# Patient Record
Sex: Female | Born: 1980 | Race: White | Hispanic: No | Marital: Single | Smoking: Former smoker
Health system: Southern US, Community
[De-identification: ages and names within clinical notes are randomized; demographics above are authoritative.]

## PROBLEM LIST (undated history)

## (undated) ENCOUNTER — Inpatient Hospital Stay (HOSPITAL_COMMUNITY): Payer: Self-pay

## (undated) DIAGNOSIS — D696 Thrombocytopenia, unspecified: Secondary | ICD-10-CM

## (undated) DIAGNOSIS — K7460 Unspecified cirrhosis of liver: Secondary | ICD-10-CM

## (undated) DIAGNOSIS — Z5189 Encounter for other specified aftercare: Secondary | ICD-10-CM

## (undated) DIAGNOSIS — Z789 Other specified health status: Secondary | ICD-10-CM

## (undated) DIAGNOSIS — K3189 Other diseases of stomach and duodenum: Secondary | ICD-10-CM

## (undated) DIAGNOSIS — O09299 Supervision of pregnancy with other poor reproductive or obstetric history, unspecified trimester: Secondary | ICD-10-CM

## (undated) HISTORY — PX: CERVICAL CERCLAGE: SHX1329

## (undated) HISTORY — DX: Supervision of pregnancy with other poor reproductive or obstetric history, unspecified trimester: O09.299

---

## 1999-08-06 ENCOUNTER — Emergency Department (HOSPITAL_COMMUNITY): Admission: EM | Admit: 1999-08-06 | Discharge: 1999-08-06 | Payer: Self-pay | Admitting: Emergency Medicine

## 2000-03-08 ENCOUNTER — Encounter: Payer: Self-pay | Admitting: Emergency Medicine

## 2000-03-08 ENCOUNTER — Emergency Department (HOSPITAL_COMMUNITY): Admission: EM | Admit: 2000-03-08 | Discharge: 2000-03-08 | Payer: Self-pay | Admitting: Emergency Medicine

## 2001-08-16 ENCOUNTER — Inpatient Hospital Stay (HOSPITAL_COMMUNITY): Admission: AD | Admit: 2001-08-16 | Discharge: 2001-08-16 | Payer: Self-pay | Admitting: Obstetrics

## 2001-08-22 ENCOUNTER — Ambulatory Visit (HOSPITAL_COMMUNITY): Admission: RE | Admit: 2001-08-22 | Discharge: 2001-08-22 | Payer: Self-pay | Admitting: Obstetrics

## 2001-12-28 ENCOUNTER — Inpatient Hospital Stay (HOSPITAL_COMMUNITY): Admission: AD | Admit: 2001-12-28 | Discharge: 2001-12-28 | Payer: Self-pay | Admitting: Obstetrics

## 2002-01-30 ENCOUNTER — Inpatient Hospital Stay (HOSPITAL_COMMUNITY): Admission: AD | Admit: 2002-01-30 | Discharge: 2002-02-02 | Payer: Self-pay | Admitting: Obstetrics

## 2002-02-03 ENCOUNTER — Encounter: Admission: RE | Admit: 2002-02-03 | Discharge: 2002-03-05 | Payer: Self-pay | Admitting: Obstetrics

## 2002-03-06 ENCOUNTER — Encounter: Admission: RE | Admit: 2002-03-06 | Discharge: 2002-04-05 | Payer: Self-pay | Admitting: Obstetrics

## 2006-11-02 ENCOUNTER — Ambulatory Visit (HOSPITAL_COMMUNITY): Admission: RE | Admit: 2006-11-02 | Discharge: 2006-11-02 | Payer: Self-pay | Admitting: Obstetrics

## 2006-12-07 ENCOUNTER — Ambulatory Visit (HOSPITAL_COMMUNITY): Admission: RE | Admit: 2006-12-07 | Discharge: 2006-12-07 | Payer: Self-pay | Admitting: Obstetrics

## 2007-01-11 ENCOUNTER — Ambulatory Visit (HOSPITAL_COMMUNITY): Admission: RE | Admit: 2007-01-11 | Discharge: 2007-01-11 | Payer: Self-pay | Admitting: Obstetrics

## 2007-01-25 ENCOUNTER — Ambulatory Visit (HOSPITAL_COMMUNITY): Admission: RE | Admit: 2007-01-25 | Discharge: 2007-01-25 | Payer: Self-pay | Admitting: Obstetrics

## 2007-02-08 ENCOUNTER — Ambulatory Visit (HOSPITAL_COMMUNITY): Admission: RE | Admit: 2007-02-08 | Discharge: 2007-02-08 | Payer: Self-pay | Admitting: Obstetrics

## 2007-02-22 ENCOUNTER — Ambulatory Visit (HOSPITAL_COMMUNITY): Admission: RE | Admit: 2007-02-22 | Discharge: 2007-02-22 | Payer: Self-pay | Admitting: Obstetrics

## 2007-03-08 ENCOUNTER — Ambulatory Visit (HOSPITAL_COMMUNITY): Admission: RE | Admit: 2007-03-08 | Discharge: 2007-03-08 | Payer: Self-pay | Admitting: Obstetrics

## 2007-03-21 ENCOUNTER — Ambulatory Visit (HOSPITAL_COMMUNITY): Admission: RE | Admit: 2007-03-21 | Discharge: 2007-03-21 | Payer: Self-pay | Admitting: Obstetrics

## 2007-03-30 ENCOUNTER — Ambulatory Visit (HOSPITAL_COMMUNITY): Admission: RE | Admit: 2007-03-30 | Discharge: 2007-03-30 | Payer: Self-pay | Admitting: Obstetrics

## 2007-04-13 ENCOUNTER — Ambulatory Visit (HOSPITAL_COMMUNITY): Admission: RE | Admit: 2007-04-13 | Discharge: 2007-04-13 | Payer: Self-pay | Admitting: Obstetrics

## 2007-05-04 ENCOUNTER — Ambulatory Visit (HOSPITAL_COMMUNITY): Admission: RE | Admit: 2007-05-04 | Discharge: 2007-05-04 | Payer: Self-pay | Admitting: Obstetrics

## 2007-05-23 ENCOUNTER — Inpatient Hospital Stay (HOSPITAL_COMMUNITY): Admission: AD | Admit: 2007-05-23 | Discharge: 2007-05-23 | Payer: Self-pay | Admitting: Obstetrics

## 2007-05-30 ENCOUNTER — Inpatient Hospital Stay (HOSPITAL_COMMUNITY): Admission: AD | Admit: 2007-05-30 | Discharge: 2007-06-01 | Payer: Self-pay | Admitting: Obstetrics

## 2008-11-22 DIAGNOSIS — Z5189 Encounter for other specified aftercare: Secondary | ICD-10-CM

## 2008-11-22 HISTORY — DX: Encounter for other specified aftercare: Z51.89

## 2009-03-29 ENCOUNTER — Observation Stay: Payer: Self-pay | Admitting: Obstetrics and Gynecology

## 2010-01-09 ENCOUNTER — Ambulatory Visit (HOSPITAL_COMMUNITY): Admission: RE | Admit: 2010-01-09 | Discharge: 2010-01-09 | Payer: Self-pay | Admitting: Obstetrics

## 2010-01-09 ENCOUNTER — Observation Stay (HOSPITAL_COMMUNITY): Admission: AD | Admit: 2010-01-09 | Discharge: 2010-01-09 | Payer: Self-pay | Admitting: Obstetrics

## 2010-01-13 ENCOUNTER — Ambulatory Visit (HOSPITAL_COMMUNITY): Admission: RE | Admit: 2010-01-13 | Discharge: 2010-01-13 | Payer: Self-pay | Admitting: Obstetrics

## 2010-01-14 ENCOUNTER — Ambulatory Visit (HOSPITAL_COMMUNITY): Admission: RE | Admit: 2010-01-14 | Discharge: 2010-01-14 | Payer: Self-pay | Admitting: Obstetrics

## 2010-03-02 ENCOUNTER — Inpatient Hospital Stay (HOSPITAL_COMMUNITY): Admission: AD | Admit: 2010-03-02 | Discharge: 2010-03-05 | Payer: Self-pay | Admitting: Obstetrics

## 2011-02-10 LAB — CBC
Hemoglobin: 10.9 g/dL — ABNORMAL LOW (ref 12.0–15.0)
MCHC: 33.8 g/dL (ref 30.0–36.0)
MCHC: 35.1 g/dL (ref 30.0–36.0)
MCV: 91.8 fL (ref 78.0–100.0)
MCV: 92.8 fL (ref 78.0–100.0)
Platelets: 175 10*3/uL (ref 150–400)
RBC: 3.26 MIL/uL — ABNORMAL LOW (ref 3.87–5.11)
RDW: 13.9 % (ref 11.5–15.5)
RDW: 14.3 % (ref 11.5–15.5)
WBC: 10.5 10*3/uL (ref 4.0–10.5)
WBC: 9.8 10*3/uL (ref 4.0–10.5)

## 2011-02-10 LAB — RPR: RPR Ser Ql: NONREACTIVE

## 2011-04-09 NOTE — Op Note (Signed)
[facility]  Patient:    Lindsey Huang, Lindsey Huang Visit Number: [id number] MRN: [id number]          Service Type: DSU Location: [facility] Attending Physician:  Venita Sheffield Dictated by:   Kathreen Cosier, M.D. Proc. Date: 08/22/01 Admit Date:  08/22/2001                             Operative Report  PREOPERATIVE DIAGNOSES:       Incompetent cervix.  PROCEDURE:                    Cervical cerclage.  DESCRIPTION OF PROCEDURE:     After spinal anesthesia was administered with patient in the lithotomy position, perineum and vagina were prepped and draped.  Bladder emptied with straight catheter.  Weighted speculum placed in the vagina.  Using a #5 Mersilene band [employer] was placed high up in the cervix in the usual fashion and tied at 6 oclock.  Patient tolerated the procedure well.  Taken to the recovery room in good condition.Dictated by: Kathreen Cosier, M.D. Attending Physician:  Venita Sheffield DD:  08/22/01 TD:  08/22/01 Job: [id number]

## 2011-04-09 NOTE — Op Note (Signed)
[name], Lindsey Huang                ACCOUNT NO.:  [id number]   MEDICAL RECORD NO.:  [id number]          PATIENT TYPE:  AMB   LOCATION:  SDC                           FACILITY:  [facility]   PHYSICIAN:  Kathreen Cosier, M.D.DATE OF BIRTH:  [birth year]   DATE OF PROCEDURE:  12/07/2006  DATE OF DISCHARGE:                               OPERATIVE REPORT   PREOPERATIVE DIAGNOSIS:  Incompetent cervix.   PROCEDURE:  Cervical cerclage.   After __________ was administered, the patient was placed in lithotomy  position.  Perineum and vagina prepped and draped, bladder entered with  a straight catheter with a speculum placed in the vagina.  The cervix  was grasped with ring forceps at 12 o'clock and a #5 Mersilene then used  to place McDonald sutures in the usual manner.  This was tied at 6  o'clock.  The patient tolerated the procedure well, taken to the  recovery room in good condition.           ______________________________  Kathreen Cosier, M.D.     BAM/MEDQ  D:  12/07/2006  T:  12/07/2006  Job:  [id number]

## 2011-09-07 LAB — CBC
HCT: 29.6 — ABNORMAL LOW
HCT: 31.3 — ABNORMAL LOW
Hemoglobin: 10.1 — ABNORMAL LOW
Hemoglobin: 10.7 — ABNORMAL LOW
MCHC: 34.1
MCHC: 34.2
MCV: 88.4
MCV: 88.9
Platelets: 222
Platelets: 238
RBC: 3.33 — ABNORMAL LOW
RBC: 3.54 — ABNORMAL LOW
RDW: 14.2 — ABNORMAL HIGH
RDW: 14.3 — ABNORMAL HIGH
WBC: 13.1 — ABNORMAL HIGH
WBC: 9

## 2011-09-07 LAB — RPR: RPR Ser Ql: NONREACTIVE

## 2012-08-07 ENCOUNTER — Emergency Department: Payer: Self-pay | Admitting: Emergency Medicine

## 2012-08-07 LAB — CBC
HCT: 41.7 % (ref 35.0–47.0)
HGB: 14.5 g/dL (ref 12.0–16.0)
MCH: 31.8 pg (ref 26.0–34.0)
MCHC: 34.8 g/dL (ref 32.0–36.0)
Platelet: 239 10*3/uL (ref 150–440)
RDW: 12.1 % (ref 11.5–14.5)
WBC: 8.3 10*3/uL (ref 3.6–11.0)

## 2012-08-07 LAB — URINALYSIS, COMPLETE
Blood: NEGATIVE
Glucose,UR: NEGATIVE mg/dL (ref 0–75)
Leukocyte Esterase: NEGATIVE
Nitrite: NEGATIVE
Protein: NEGATIVE
Specific Gravity: 1.026 (ref 1.003–1.030)

## 2012-08-07 LAB — COMPREHENSIVE METABOLIC PANEL
Anion Gap: 12 (ref 7–16)
Bilirubin,Total: 0.8 mg/dL (ref 0.2–1.0)
Calcium, Total: 9 mg/dL (ref 8.5–10.1)
Chloride: 107 mmol/L (ref 98–107)
Co2: 18 mmol/L — ABNORMAL LOW (ref 21–32)
EGFR (African American): >60
EGFR (Non-African Amer.): >60
Potassium: 3.8 mmol/L (ref 3.5–5.1)
SGOT(AST): 42 U/L — ABNORMAL HIGH (ref 15–37)
SGPT (ALT): 38 U/L (ref 12–78)
Total Protein: 7.5 g/dL (ref 6.4–8.2)

## 2012-08-07 LAB — PREGNANCY, URINE: Pregnancy Test, Urine: NEGATIVE m[IU]/mL

## 2013-10-22 LAB — OB RESULTS CONSOLE ABO/RH: RH Type: POSITIVE

## 2013-10-22 LAB — OB RESULTS CONSOLE GC/CHLAMYDIA
CHLAMYDIA, DNA PROBE: NEGATIVE
Gonorrhea: NEGATIVE

## 2013-10-22 LAB — OB RESULTS CONSOLE HEPATITIS B SURFACE ANTIGEN: Hepatitis B Surface Ag: NEGATIVE

## 2013-10-22 LAB — OB RESULTS CONSOLE ANTIBODY SCREEN: Antibody Screen: NEGATIVE

## 2013-10-22 LAB — OB RESULTS CONSOLE RPR: RPR: NONREACTIVE

## 2013-10-22 LAB — OB RESULTS CONSOLE HIV ANTIBODY (ROUTINE TESTING): HIV: NONREACTIVE

## 2013-10-22 LAB — OB RESULTS CONSOLE RUBELLA ANTIBODY, IGM: RUBELLA: IMMUNE

## 2013-11-19 LAB — OB RESULTS CONSOLE GBS: STREP GROUP B AG: NEGATIVE

## 2013-11-19 LAB — OB RESULTS CONSOLE GC/CHLAMYDIA
Chlamydia: NEGATIVE
Gonorrhea: NEGATIVE

## 2013-11-22 NOTE — L&D Delivery Note (Signed)
Delivery Note At 12:48 PM a viable female was delivered via  (Presentation: ;  ).  APGAR: , ; weight .   Placenta status: , .  Cord:  with the following complications: .  Cord pH: not done  Anesthesia:   Episiotomy:  Lacerations:  Suture Repair: 2.0 vicryl Est. Blood Loss (mL):   Mom to postpartum.  Baby to Couplet care / Skin to Skin.  Diesel Lina A 12/25/2013, 12:55 PM

## 2013-12-12 ENCOUNTER — Inpatient Hospital Stay (HOSPITAL_COMMUNITY)
Admission: AD | Admit: 2013-12-12 | Discharge: 2013-12-12 | Disposition: A | Discharge status: Home / Self Care | Payer: Medicaid Other | Source: Ambulatory Visit | Attending: Obstetrics | Admitting: Obstetrics

## 2013-12-12 ENCOUNTER — Encounter (HOSPITAL_COMMUNITY): Payer: Self-pay | Admitting: *Deleted

## 2013-12-12 DIAGNOSIS — O368190 Decreased fetal movements, unspecified trimester, not applicable or unspecified: Secondary | ICD-10-CM | POA: Insufficient documentation

## 2013-12-12 DIAGNOSIS — O479 False labor, unspecified: Secondary | ICD-10-CM | POA: Insufficient documentation

## 2013-12-12 HISTORY — DX: Other specified health status: Z78.9

## 2013-12-12 NOTE — Treatment Plan (Signed)
Dr Gaynell Facemarshall notified of patients status, gestational age, complaints, cervical exam, FHR, patient may d/c home

## 2013-12-12 NOTE — MAU Note (Signed)
Contractions, decreased fetal movement

## 2013-12-18 ENCOUNTER — Encounter (HOSPITAL_COMMUNITY): Payer: Self-pay | Admitting: *Deleted

## 2013-12-18 ENCOUNTER — Telehealth (HOSPITAL_COMMUNITY): Payer: Self-pay | Admitting: *Deleted

## 2013-12-18 NOTE — Telephone Encounter (Signed)
Preadmission screen  

## 2013-12-25 ENCOUNTER — Inpatient Hospital Stay (HOSPITAL_COMMUNITY): Payer: Medicaid Other | Admitting: Anesthesiology

## 2013-12-25 ENCOUNTER — Encounter (HOSPITAL_COMMUNITY): Payer: Medicaid Other | Admitting: Anesthesiology

## 2013-12-25 ENCOUNTER — Encounter (HOSPITAL_COMMUNITY): Payer: Self-pay

## 2013-12-25 ENCOUNTER — Inpatient Hospital Stay (HOSPITAL_COMMUNITY)
Admission: RE | Admit: 2013-12-25 | Discharge: 2013-12-26 | DRG: 775 | Disposition: A | Discharge status: Home / Self Care | Payer: Medicaid Other | Source: Ambulatory Visit | Attending: Obstetrics | Admitting: Obstetrics

## 2013-12-25 DIAGNOSIS — O480 Post-term pregnancy: Principal | ICD-10-CM | POA: Diagnosis present

## 2013-12-25 DIAGNOSIS — Z87891 Personal history of nicotine dependence: Secondary | ICD-10-CM

## 2013-12-25 HISTORY — DX: Encounter for other specified aftercare: Z51.89

## 2013-12-25 LAB — CBC
HCT: 32.5 % — ABNORMAL LOW (ref 36.0–46.0)
HEMOGLOBIN: 10.7 g/dL — AB (ref 12.0–15.0)
MCH: 29.9 pg (ref 26.0–34.0)
MCHC: 32.9 g/dL (ref 30.0–36.0)
MCV: 90.8 fL (ref 78.0–100.0)
Platelets: 201 10*3/uL (ref 150–400)
RBC: 3.58 MIL/uL — AB (ref 3.87–5.11)
RDW: 14.3 % (ref 11.5–15.5)
WBC: 10.2 10*3/uL (ref 4.0–10.5)

## 2013-12-25 LAB — TYPE AND SCREEN
ABO/RH(D): A POS
Antibody Screen: NEGATIVE

## 2013-12-25 LAB — ABO/RH: ABO/RH(D): A POS

## 2013-12-25 LAB — RPR: RPR: NONREACTIVE

## 2013-12-25 MED ORDER — BENZOCAINE-MENTHOL 20-0.5 % EX AERO
1.0000 "application " | INHALATION_SPRAY | CUTANEOUS | Status: DC | PRN
  Administered 2013-12-25: 1 via TOPICAL
  Filled 2013-12-25: qty 56

## 2013-12-25 MED ORDER — CITRIC ACID-SODIUM CITRATE 334-500 MG/5ML PO SOLN: 30.0000 mL | ORAL | Status: DC | PRN

## 2013-12-25 MED ORDER — WITCH HAZEL-GLYCERIN EX PADS: 1.0000 "application " | MEDICATED_PAD | CUTANEOUS | Status: DC | PRN

## 2013-12-25 MED ORDER — TETANUS-DIPHTH-ACELL PERTUSSIS 5-2.5-18.5 LF-MCG/0.5 IM SUSP: 0.5000 mL | Freq: Once | INTRAMUSCULAR | Status: DC

## 2013-12-25 MED ORDER — ONDANSETRON HCL 4 MG PO TABS: 4.0000 mg | ORAL_TABLET | ORAL | Status: DC | PRN

## 2013-12-25 MED ORDER — ACETAMINOPHEN 325 MG PO TABS: 650.0000 mg | ORAL_TABLET | ORAL | Status: DC | PRN

## 2013-12-25 MED ORDER — EPHEDRINE 5 MG/ML INJ
10.0000 mg | INTRAVENOUS | Status: DC | PRN
  Filled 2013-12-25: qty 2

## 2013-12-25 MED ORDER — IBUPROFEN 600 MG PO TABS
600.0000 mg | ORAL_TABLET | Freq: Four times a day (QID) | ORAL | Status: DC
  Administered 2013-12-25 – 2013-12-26 (×4): 600 mg via ORAL
  Filled 2013-12-25 (×4): qty 1

## 2013-12-25 MED ORDER — OXYCODONE-ACETAMINOPHEN 5-325 MG PO TABS
1.0000 | ORAL_TABLET | ORAL | Status: DC | PRN
  Administered 2013-12-25 – 2013-12-26 (×3): 1 via ORAL
  Administered 2013-12-26: 2 via ORAL
  Filled 2013-12-25: qty 1
  Filled 2013-12-25: qty 2
  Filled 2013-12-25 (×2): qty 1

## 2013-12-25 MED ORDER — LACTATED RINGERS IV SOLN: 500.0000 mL | Freq: Once | INTRAVENOUS | Status: DC

## 2013-12-25 MED ORDER — OXYTOCIN 40 UNITS IN LACTATED RINGERS INFUSION - SIMPLE MED
1.0000 m[IU]/min | INTRAVENOUS | Status: DC
  Administered 2013-12-25: 2 m[IU]/min via INTRAVENOUS
  Filled 2013-12-25: qty 1000

## 2013-12-25 MED ORDER — LACTATED RINGERS IV SOLN
INTRAVENOUS | Status: DC
  Administered 2013-12-25: 08:00:00 via INTRAVENOUS

## 2013-12-25 MED ORDER — ZOLPIDEM TARTRATE 5 MG PO TABS: 5.0000 mg | ORAL_TABLET | Freq: Every evening | ORAL | Status: DC | PRN

## 2013-12-25 MED ORDER — DIBUCAINE 1 % RE OINT: 1.0000 "application " | TOPICAL_OINTMENT | RECTAL | Status: DC | PRN

## 2013-12-25 MED ORDER — ONDANSETRON HCL 4 MG/2ML IJ SOLN
4.0000 mg | Freq: Four times a day (QID) | INTRAMUSCULAR | Status: DC | PRN
Start: 2013-12-25 — End: 2013-12-25

## 2013-12-25 MED ORDER — OXYTOCIN BOLUS FROM INFUSION: 500.0000 mL | INTRAVENOUS | Status: DC

## 2013-12-25 MED ORDER — FLEET ENEMA 7-19 GM/118ML RE ENEM: 1.0000 | ENEMA | Freq: Once | RECTAL | Status: DC

## 2013-12-25 MED ORDER — METHYLERGONOVINE MALEATE 0.2 MG PO TABS
0.2000 mg | ORAL_TABLET | ORAL | Status: DC
  Administered 2013-12-25 – 2013-12-26 (×6): 0.2 mg via ORAL
  Filled 2013-12-25 (×6): qty 1

## 2013-12-25 MED ORDER — TERBUTALINE SULFATE 1 MG/ML IJ SOLN: 0.2500 mg | Freq: Once | INTRAMUSCULAR | Status: DC | PRN

## 2013-12-25 MED ORDER — LANOLIN HYDROUS EX OINT: TOPICAL_OINTMENT | CUTANEOUS | Status: DC | PRN

## 2013-12-25 MED ORDER — FERROUS SULFATE 325 (65 FE) MG PO TABS
325.0000 mg | ORAL_TABLET | Freq: Two times a day (BID) | ORAL | Status: DC
  Administered 2013-12-25 – 2013-12-26 (×2): 325 mg via ORAL
  Filled 2013-12-25 (×2): qty 1

## 2013-12-25 MED ORDER — DIPHENHYDRAMINE HCL 50 MG/ML IJ SOLN: 12.5000 mg | INTRAMUSCULAR | Status: DC | PRN

## 2013-12-25 MED ORDER — IBUPROFEN 600 MG PO TABS: 600.0000 mg | ORAL_TABLET | Freq: Four times a day (QID) | ORAL | Status: DC | PRN

## 2013-12-25 MED ORDER — OXYTOCIN 40 UNITS IN LACTATED RINGERS INFUSION - SIMPLE MED: 62.5000 mL/h | INTRAVENOUS | Status: DC

## 2013-12-25 MED ORDER — BUTORPHANOL TARTRATE 1 MG/ML IJ SOLN
1.0000 mg | INTRAMUSCULAR | Status: DC | PRN
  Administered 2013-12-25: 1 mg via INTRAVENOUS
  Filled 2013-12-25: qty 1

## 2013-12-25 MED ORDER — LACTATED RINGERS IV SOLN: 500.0000 mL | INTRAVENOUS | Status: DC | PRN

## 2013-12-25 MED ORDER — EPHEDRINE 5 MG/ML INJ
10.0000 mg | INTRAVENOUS | Status: DC | PRN
  Filled 2013-12-25: qty 2
  Filled 2013-12-25: qty 4

## 2013-12-25 MED ORDER — SENNOSIDES-DOCUSATE SODIUM 8.6-50 MG PO TABS
2.0000 | ORAL_TABLET | ORAL | Status: DC
Start: 2013-12-26 — End: 2013-12-26
  Administered 2013-12-25: 2 via ORAL
  Filled 2013-12-25: qty 2

## 2013-12-25 MED ORDER — ONDANSETRON HCL 4 MG/2ML IJ SOLN: 4.0000 mg | INTRAMUSCULAR | Status: DC | PRN

## 2013-12-25 MED ORDER — SIMETHICONE 80 MG PO CHEW: 80.0000 mg | CHEWABLE_TABLET | ORAL | Status: DC | PRN

## 2013-12-25 MED ORDER — PHENYLEPHRINE 40 MCG/ML (10ML) SYRINGE FOR IV PUSH (FOR BLOOD PRESSURE SUPPORT)
80.0000 ug | PREFILLED_SYRINGE | INTRAVENOUS | Status: DC | PRN
  Filled 2013-12-25: qty 2

## 2013-12-25 MED ORDER — PRENATAL MULTIVITAMIN CH
1.0000 | ORAL_TABLET | Freq: Every day | ORAL | Status: DC
  Administered 2013-12-26: 1 via ORAL
  Filled 2013-12-25: qty 1

## 2013-12-25 MED ORDER — DIPHENHYDRAMINE HCL 25 MG PO CAPS: 25.0000 mg | ORAL_CAPSULE | Freq: Four times a day (QID) | ORAL | Status: DC | PRN

## 2013-12-25 MED ORDER — LIDOCAINE HCL (PF) 1 % IJ SOLN
30.0000 mL | INTRAMUSCULAR | Status: DC | PRN
  Filled 2013-12-25 (×2): qty 30

## 2013-12-25 MED ORDER — FENTANYL 2.5 MCG/ML BUPIVACAINE 1/10 % EPIDURAL INFUSION (WH - ANES)
14.0000 mL/h | INTRAMUSCULAR | Status: DC | PRN
  Administered 2013-12-25: 14 mL/h via EPIDURAL
  Filled 2013-12-25: qty 125

## 2013-12-25 MED ORDER — PHENYLEPHRINE 40 MCG/ML (10ML) SYRINGE FOR IV PUSH (FOR BLOOD PRESSURE SUPPORT)
80.0000 ug | PREFILLED_SYRINGE | INTRAVENOUS | Status: DC | PRN
  Filled 2013-12-25: qty 2
  Filled 2013-12-25: qty 10

## 2013-12-25 MED ORDER — OXYCODONE-ACETAMINOPHEN 5-325 MG PO TABS: 1.0000 | ORAL_TABLET | ORAL | Status: DC | PRN

## 2013-12-25 MED ORDER — METHYLERGONOVINE MALEATE 0.2 MG/ML IJ SOLN
0.2000 mg | Freq: Once | INTRAMUSCULAR | Status: AC
  Administered 2013-12-25: 0.2 mg via INTRAMUSCULAR

## 2013-12-25 MED ORDER — LIDOCAINE HCL (PF) 1 % IJ SOLN
INTRAMUSCULAR | Status: DC | PRN
  Administered 2013-12-25: 5 mL

## 2013-12-25 NOTE — H&P (Signed)
This is and Dr. Francoise CeoBernard Marshall dictating the history and physical on  Lindsey EndoKelly  Huang she is a 33 year old gravida 7 para 3124 at 40 weeks and and a due date 12/24/2013 negative GBS admitted for induction cervix 3-4 cm 90% vertex -2 amniotomy performed the fluids clear and she is on low-dose Pitocin Past medical history negative Past surgical history negative Social history negative System review noncontributory Physical exam well-developed female in early labor HEENT negative Lungs clear to P&A Heart regular rhythm no murmurs no gallops Abdomen term Pelvic as described above Extremities negative

## 2013-12-25 NOTE — Anesthesia Preprocedure Evaluation (Signed)
Anesthesia Evaluation  Patient identified by MRN, date of birth, ID band Patient awake    Reviewed: Allergy & Precautions, H&P , Patient's Chart, lab work & pertinent test results  Airway Mallampati: II TM Distance: >3 FB Neck ROM: full    Dental   Pulmonary former smoker,  breath sounds clear to auscultation        Cardiovascular Rhythm:regular Rate:Normal     Neuro/Psych    GI/Hepatic   Endo/Other    Renal/GU      Musculoskeletal   Abdominal   Peds  Hematology   Anesthesia Other Findings   Reproductive/Obstetrics (+) Pregnancy                           Anesthesia Physical Anesthesia Plan  ASA: II  Anesthesia Plan: Epidural   Post-op Pain Management:    Induction:   Airway Management Planned:   Additional Equipment:   Intra-op Plan:   Post-operative Plan:   Informed Consent: I have reviewed the patients History and Physical, chart, labs and discussed the procedure including the risks, benefits and alternatives for the proposed anesthesia with the patient or authorized representative who has indicated his/her understanding and acceptance.     Plan Discussed with:   Anesthesia Plan Comments:         Anesthesia Quick Evaluation  

## 2013-12-25 NOTE — Anesthesia Procedure Notes (Signed)
Epidural Patient location during procedure: OB Start time: 12/25/2013 12:17 PM  Staffing Anesthesiologist: Brayton CavesJACKSON, Haruto Demaria Performed by: anesthesiologist   Preanesthetic Checklist Completed: patient identified, site marked, surgical consent, pre-op evaluation, timeout performed, IV checked, risks and benefits discussed and monitors and equipment checked  Epidural Patient position: sitting Prep: site prepped and draped and DuraPrep Patient monitoring: continuous pulse ox and blood pressure Approach: midline Injection technique: LOR air  Needle:  Needle type: Tuohy  Needle gauge: 17 G Needle length: 9 cm and 9 Needle insertion depth: 5 cm cm Catheter type: closed end flexible Catheter size: 19 Gauge Catheter at skin depth: 10 cm Test dose: negative  Assessment Events: blood not aspirated, injection not painful, no injection resistance, negative IV test and no paresthesia  Additional Notes Patient identified.  Risk benefits discussed including failed block, incomplete pain control, headache, nerve damage, paralysis, blood pressure changes, nausea, vomiting, reactions to medication both toxic or allergic, and postpartum back pain.  Patient expressed understanding and wished to proceed.  All questions were answered.  Sterile technique used throughout procedure and epidural site dressed with sterile barrier dressing. No paresthesia or other complications noted.The patient did not experience any signs of intravascular injection such as tinnitus or metallic taste in mouth nor signs of intrathecal spread such as rapid motor block. Please see nursing notes for vital signs.

## 2013-12-25 NOTE — Lactation Note (Signed)
This note was copied from the chart of Boy Fredderick SeveranceKelly Whitter. Lactation Consultation Note  Patient Name: Boy Fredderick SeveranceKelly Bolz ZOXWR'UToday's Date: 12/25/2013 Reason for consult: Initial assessment;Other (Comment) (charting for exclusion) This is mom's fourth child.  She has hx of delayed but abundant milk production with other babies and prefers combining breast and formula feeding, as well as pumping as needed.  Mom planning to call her insurance company to inquire about receiving an electric breast pump.  For now, she will offer breast and supplement with formula as needed.  Mom says she knows how to hand express her colostrum.  LC encouraged STS and cue feedings. LC encouraged review of Baby and Me pp 9, 14 and 20-25 for STS and BF information. LC provided [employer] Resource brochure and reviewed [facility] services and list of community and web site resources.    Maternal Data Formula Feeding for Exclusion: Yes Reason for exclusion: Mother's choice to formula and breast feed on admission Infant to breast within first hour of birth: Yes (sleepy at first attempt) Has patient been taught Hand Expression?: Yes (experienced with breastfeeding and pumping) Does the patient have breastfeeding experience prior to this delivery?: Yes  Feeding    LATCH Score/Interventions              initial LATCH score=6 (baby sleepy)        Lactation Tools Discussed/Used   STS, cue feedings, milk storage guidelines  Consult Status Consult Status: Follow-up Date: 12/26/13 Follow-up type: In-patient    Warrick ParisianBryant, Melvinia Ashby [facility] 12/25/2013, 8:31 PM

## 2013-12-26 LAB — CBC
HEMATOCRIT: 30.3 % — AB (ref 36.0–46.0)
Hemoglobin: 10.1 g/dL — ABNORMAL LOW (ref 12.0–15.0)
MCH: 30.3 pg (ref 26.0–34.0)
MCHC: 33.3 g/dL (ref 30.0–36.0)
MCV: 91 fL (ref 78.0–100.0)
PLATELETS: 199 10*3/uL (ref 150–400)
RBC: 3.33 MIL/uL — ABNORMAL LOW (ref 3.87–5.11)
RDW: 14.4 % (ref 11.5–15.5)
WBC: 11.1 10*3/uL — ABNORMAL HIGH (ref 4.0–10.5)

## 2013-12-26 NOTE — Progress Notes (Signed)
Admission nutrition screen triggered. Patients chart reviewed and assessed  for nutritional risk. Patient is determined to be at low nutrition  risk.   Alysabeth Scalia M.Ed. R.D. LDN Neonatal Nutrition Support Specialist Pager [phone]  

## 2013-12-26 NOTE — Discharge Instructions (Signed)
Discharge instructions ° °· You can wash your hair °· Shower °· Eat what you want °· Drink what you want °· See me in 6 weeks °· Your ankles are going to swell more in the next 2 weeks than when pregnant °· No sex for 6 weeks ° ° °Aminta Sakurai A, MD 12/26/2013 ° ° °

## 2013-12-26 NOTE — Progress Notes (Signed)
UR chart review completed.  

## 2013-12-26 NOTE — Anesthesia Postprocedure Evaluation (Signed)
  Anesthesia Post-op Note  Patient: Lindsey HalstedKelly D Bachtel  Procedure(s) Performed: * No procedures listed *  Patient Location: Mother/Baby  Anesthesia Type:Epidural  Level of Consciousness: awake  Airway and Oxygen Therapy: Patient Spontanous Breathing  Post-op Pain: none  Post-op Assessment: Patient's Cardiovascular Status Stable, Respiratory Function Stable, Patent Airway, No signs of Nausea or vomiting, Adequate PO intake, Pain level controlled, No headache, No backache, No residual numbness and No residual motor weakness  Post-op Vital Signs: Reviewed and stable  Complications: No apparent anesthesia complications

## 2013-12-26 NOTE — Clinical Social Work Maternal (Signed)
    Clinical Social Work Department PSYCHOSOCIAL ASSESSMENT - MATERNAL/CHILD 12/26/2013  Patient:  Lindsey Huang,Lindsey Huang  Account Number:  [id number]  Admit Date:  12/25/2013  Lindsey Huang:   Lindsey Huang    Clinical Social Worker:  Lindsey PutnamEDRA Jaylean Buenaventura, LCSW   Date/Time:  12/26/2013 02:53 PM  Date Referred:  12/26/2013   Referral source  CN     Referred reason  [facility]   Other referral source:    I:  FAMILY / HOME ENVIRONMENT Child's legal guardian:  PARENT  Guardian - Huang Guardian - Age Guardian - Address  Lindsey SeveranceKelly Huang 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  Pt told CSW that she did not know or suspect pregnancy.  She denies experiencing any experiencing any pregnancy symptoms until she could feel movement.  Once pregnancy was suspected & confirmed she established regular PNC.  She denies any illegal substance use & verbalized an understanding of hospital drug testing policy.  UDS is negative, meconium results are pending.  FOB is not involved.  According to pt, FOB ended  their 15 year relationship, last month.  CSW inquired about depression however pt denies symptoms.  She appears to be bonding well with the infant.  She identifies her sister as her primary support person.  Pt has all the necessary supplies for the infant.  CSW discussed PP depression signs/symptoms & encouraged her seek medical attention if needed.  CSW will continue to monitor drug screen results & make a referral if necessary.      VI SOCIAL WORK PLAN Social Work Plan  No Further Intervention Required / No Barriers to Discharge   Type of pt/family education:   If child protective services report - county:   If child protective services report - date:   Information/referral to community resources comment:   Other social work plan:

## 2013-12-26 NOTE — Progress Notes (Signed)
Patient ID: Lindsey Huang, female   DOB: Jan 22, 1981, 33 y.o.   MRN: [id number] Postpartum day one Vital signs normal Fundus firm Lochia moderate Legs negative Patient wants her to discharge on Percocet

## 2013-12-26 NOTE — Discharge Summary (Signed)
Obstetric Discharge Summary Reason for Admission: induction of labor Prenatal Procedures: none Intrapartum Procedures: spontaneous vaginal delivery Postpartum Procedures: none Complications-Operative and Postpartum: none Hemoglobin  Date Value Range Status  12/26/2013 10.1* 12.0 - 15.0 g/dL Final     HCT  Date Value Range Status  12/26/2013 30.3* 36.0 - 46.0 % Final    Physical Exam:  General: alert Lochia: appropriate Uterine Fundus: firm Incision: healing well DVT Evaluation: No evidence of DVT seen on physical exam.  Discharge Diagnoses: Term Pregnancy-delivered  Discharge Information: Date: 12/26/2013 Activity: pelvic rest Diet: routine Medications: Percocet Condition: stable Instructions: refer to practice specific booklet Discharge to: home Follow-up Information   Follow up with Kathreen CosierMARSHALL,BERNARD A, MD.   Specialty:  Obstetrics and Gynecology   Contact information:   [address] [place] [address] [phone]       Newborn Data: Live born female  Birth Weight: 6 lb 1.6 oz (2767 g) APGAR: 9, 9  Home with mother.  MARSHALL,BERNARD A 12/26/2013, 6:51 AM

## 2014-09-23 ENCOUNTER — Encounter (HOSPITAL_COMMUNITY): Payer: Self-pay

## 2015-01-31 ENCOUNTER — Observation Stay: Payer: Self-pay | Admitting: Internal Medicine

## 2015-03-23 NOTE — H&P (Signed)
PATIENT NAME:  Lindsey Huang, RAUTH MR#:  [id number] DATE OF BIRTH:  February 21, 1981  DATE OF ADMISSION:  01/31/2015  REFERRING PHYSICIAN: Enedina Finner. Manson Passey, MD   PRIMARY CARE PHYSICIAN: [facility].   ADMISSION DIAGNOSIS: Hepatitis.   HISTORY OF PRESENT ILLNESS: This is a 34 year old Caucasian female who presents to the Emergency Department complaining of her "eyes turning yellow." The patient admits to nausea and vomiting nearly daily for the last 30 days. She also admits to some sore throat secondary to the constant vomiting. She notes that her abdomen has been somewhat tender, particularly in the right upper quadrant, but she denies fevers. She admits to some fatigue. The patient also admits to liquor daily. Lately it has been between 2 and 4 shots per day; however, a few months ago the patient admits to at least one-fifth of liquor per day. She has tried to wean herself from that quantity of alcohol and admits that she felt shaky at some point but has reduced her alcohol intake to a few shots, as described above mostly due to her inability to keep food down. The patient admits to being under a lot of stress lately. Due to extreme elevations of her liver enzymes, the Emergency Department called for admission.   REVIEW OF SYSTEMS:  CONSTITUTIONAL: The patient denies fever, but admits to fatigue.  EYES: Denies blurred vision or inflammation.  EARS, NOSE AND THROAT: Denies tinnitus, but admits to sore throat.  RESPIRATORY: Denies cough or shortness of breath.  CARDIOVASCULAR: Denies chest pain, palpitations, orthopnea, or paroxysmal nocturnal dyspnea.  GASTROINTESTINAL: Admits to nausea and vomiting but denies diarrhea. The patient admits to some vague abdominal pain as well.   GENITOURINARY: Denies dysuria, increased frequency or hesitancy of urination.  ENDOCRINE: Denies polyuria or polydipsia.  HEMATOLOGIC AND LYMPHATIC: The patient admits to easy bruising, but denies any bleeding.   INTEGUMENTARY: Denies rashes or lesions.  MUSCULOSKELETAL: Denies arthralgias or myalgias.  NEUROLOGIC: Denies numbness or tingling in her extremities or dysarthria.  PSYCHIATRIC: Admits to depression, but denies suicidal ideation.   PAST MEDICAL HISTORY: The patient does not have any chronic medical illnesses nor has she been told she should be taking medications for anything in particular.   PAST SURGICAL HISTORY: The patient has had a cerclage placement for her pregnancies.   SOCIAL HISTORY: She has a 5-pack-year history of smoking. The patient denies any drugs. Her alcohol intake is described as above. She has 5 children, all of whom are in good health.   FAMILY HISTORY: The patient is unaware of any chronic medical illnesses including cancer, diabetes, or hypertension that run throughout her family.   MEDICATIONS: None.   ALLERGIES: No known drug allergies.   PERTINENT LABORATORY RESULTS AND RADIOGRAPHIC FINDINGS: Serum glucose is 112, BUN 7, creatinine 0.53, serum sodium 136, potassium 3.4, chloride is 100, bicarbonate 25, calcium is 9.1. Lipase is 31, protein is 6.6, serum albumin 3.6, total bilirubin 7.4, alkaline phosphatase 346, AST 736, ALT is 214. White blood cell count 6.5, hemoglobin is 12.1, hematocrit 37.7, platelet count 197,000, MCV is 100. INR is 0.9. Urinalysis is negative for infection. Ultrasound of the abdomen shows diffuse fatty infiltration of the liver with normal gallbladder and bile ducts.   PHYSICAL EXAMINATION:  VITAL SIGNS: Temperature is a 98.3, pulse 74, respirations 16, blood pressure 116/82, pulse oximetry is 100% on room air.  GENERAL: The patient is alert and oriented x 3 in no apparent distress.  HEENT: Normocephalic, atraumatic. Pupils equal, round, and  reactive to light and accommodation. There is scleral icterus present. Extraocular movements are intact. The patient has moist mucous membranes.  NECK: Trachea is midline. No adenopathy. Thyroid  nonpalpable and nontender.  CHEST: Symmetric and atraumatic.  CARDIOVASCULAR: Regular rate and rhythm. Normal S1, S2. No rubs, clicks, or murmurs appreciated.  LUNGS: Clear to auscultation bilaterally. Normal effort and excursion.  ABDOMEN: Positive bowel sounds. Soft. Mildly tender, particularly in the right upper quadrant. There is no splenomegaly. The liver edge is palpable approximately 2 cm below the costal margin. There is no rebound tenderness.  GENITOURINARY: Deferred.  MUSCULOSKELETAL: The patient has full range of motion of all 4 extremities. She has 5/5 strength in upper and lower extremities bilaterally.  SKIN: Warm and dry. No rashes or lesions.  EXTREMITIES: No clubbing, cyanosis, or edema.  NEUROLOGIC: Cranial nerves II-XII are grossly intact.  PSYCHIATRIC: Mood is normal. Affect is congruent. The patient has excellent insight and judgment into her medical condition.   ASSESSMENT AND PLAN: This is a 34 year old female admitted for hepatitis.  1.  Hepatitis. This is likely alcohol induced. She does have associated jaundice, but ultrasound of the liver does not show ductal dilatation or gallstones. She does have fatty liver. We should rule out viral etiology of hepatitis. Her synthetic function is fine, as her INR is normal at this time. She also has normal albumin. We will order a gastrointestinal consult at the discretion of the primary care team.  2.  Alcohol abuse. Place the patient on CIWA scale.  3.  Overweight. The patient's BMI is 26. I have encouraged exercise and healthy diet.  4.  Deep vein thrombosis prophylaxis. Sequential compression devices.  5.  Gastrointestinal prophylaxis. None, as the patient is not critically ill.   CODE STATUS: The patient is a full code.   TIME SPENT ADMISSION ORDERS AND PATIENT CARE: Approximately 35 minutes.   ____________________________ Kelton PillarMichael S. Sheryle Hailiamond, MD msd:bm D: 01/31/2015 03:11:27 ET T: 01/31/2015 03:34:02  ET JOB#: [id number]  cc: Kelton PillarMichael S. Sheryle Hailiamond, MD, <Dictator> Kelton PillarMICHAEL S Glade Strausser MD ELECTRONICALLY SIGNED 02/03/2015 3:16

## 2015-03-23 NOTE — Discharge Summary (Signed)
PATIENT NAME:  Lindsey Huang, Lindsey Huang MR#:  [id number] DATE OF BIRTH:  [birth year]  DATE OF ADMISSION:  01/31/2015 DATE OF DISCHARGE:  02/01/2015  PRIMARY CARE PHYSICIAN:  Excell SeltzerJohn J. Annabell HowellsWrenn, MD  DISCHARGE DIAGNOSES: 1.  Alcohol-induced hepatitis.  2.  Alcohol abuse.   CONDITION: Stable.   CODE STATUS: Full code.   HOME MEDICATIONS: Folic acid 1 mg p.o. daily, thiamine 100 mg p.o. once a day.   DIET: Regular diet.   ACTIVITY: As tolerated.  FOLLOW-UP CARE: Follow with PCP within 1-2 weeks. Also follow up CMP with PCP.   REASON FOR ADMISSION:   Yellow eyes.   HOSPITAL COURSE: The patient is a 34 year old Caucasian female with no past medical history.   Came to ED due to yellow eyes and constant vomiting.  The patient has been drinking alcohol and  recently reduced alcohol intake. For detailed history and physical examination, please refer to the admission note dictated by Dr. Sheryle Hailiamond.  On admission date, the patient's laboratory data showed BUN 7, creatinine 0.53, sodium 136, potassium 3.4, chloride 100, bicarbonate 25, lipase 31, bilirubin 7.4, alkaline phosphatase 346, AST 736, ALT 214. WBC 6.5, hemoglobin 12.1, platelets 197,000. INR 0.1. Urinalysis was negative. Ultrasound of abdomen showed diffuse fatty infiltrate of  liver with normal gallbladder and bile duct. The patient was admitted for alcohol-induced hepatitis.  After admission, the patient has been placed on IV fluid support.  Hepatitis panel is negative. The patient's liver function tests are better.  Jaundice is better.    For alcohol abuse, the patient is placed on CIWA protocol. The patient denies any tremor or shaking.   For tobacco abuse, the patient was counseled for smoking cessation.   The patient has no complaints; she wants to go home. Her vital signs are stable. I advised the patient to follow up a CMP with primary care physician.  Discussed the patient's discharge plan with the patient and the nurse.   TIME SPENT: About 35  minutes.    ____________________________ Shaune PollackQing Fathima Bartl, MD qc:tr D: 02/01/2015 16:08:13 ET T: 02/01/2015 17:31:03 ET JOB#: [id number]  cc: Shaune PollackQing Jalana Moore, MD, <Dictator> Shaune PollackQING Jalin Alicea MD ELECTRONICALLY SIGNED 02/02/2015 15:09

## 2015-10-01 ENCOUNTER — Encounter: Payer: Self-pay | Admitting: Emergency Medicine

## 2015-10-01 ENCOUNTER — Emergency Department
Admission: EM | Admit: 2015-10-01 | Discharge: 2015-10-01 | Disposition: A | Discharge status: Home / Self Care | Payer: Medicaid Other | Attending: Emergency Medicine | Admitting: Emergency Medicine

## 2015-10-01 DIAGNOSIS — R112 Nausea with vomiting, unspecified: Secondary | ICD-10-CM | POA: Diagnosis not present

## 2015-10-01 DIAGNOSIS — Z87891 Personal history of nicotine dependence: Secondary | ICD-10-CM | POA: Insufficient documentation

## 2015-10-01 DIAGNOSIS — Z3202 Encounter for pregnancy test, result negative: Secondary | ICD-10-CM | POA: Insufficient documentation

## 2015-10-01 LAB — COMPREHENSIVE METABOLIC PANEL
ALK PHOS: 60 U/L (ref 38–126)
ALT: 57 U/L — AB (ref 14–54)
AST: 91 U/L — ABNORMAL HIGH (ref 15–41)
Albumin: 4.7 g/dL (ref 3.5–5.0)
Anion gap: 14 (ref 5–15)
BUN: 21 mg/dL — ABNORMAL HIGH (ref 6–20)
CALCIUM: 8.7 mg/dL — AB (ref 8.9–10.3)
CO2: 25 mmol/L (ref 22–32)
CREATININE: 0.76 mg/dL (ref 0.44–1.00)
Chloride: 103 mmol/L (ref 101–111)
Glucose, Bld: 137 mg/dL — ABNORMAL HIGH (ref 65–99)
Potassium: 3.8 mmol/L (ref 3.5–5.1)
Sodium: 142 mmol/L (ref 135–145)
Total Bilirubin: 0.6 mg/dL (ref 0.3–1.2)
Total Protein: 7.7 g/dL (ref 6.5–8.1)

## 2015-10-01 LAB — URINALYSIS COMPLETE WITH MICROSCOPIC (ARMC ONLY)
BILIRUBIN URINE: NEGATIVE
Glucose, UA: NEGATIVE mg/dL
HGB URINE DIPSTICK: NEGATIVE
LEUKOCYTES UA: NEGATIVE
NITRITE: NEGATIVE
PH: 5 (ref 5.0–8.0)
Protein, ur: 30 mg/dL — AB
Specific Gravity, Urine: 1.03 (ref 1.005–1.030)

## 2015-10-01 LAB — CBC
HEMATOCRIT: 45.3 % (ref 35.0–47.0)
HEMOGLOBIN: 14.9 g/dL (ref 12.0–16.0)
MCH: 31.1 pg (ref 26.0–34.0)
MCHC: 32.9 g/dL (ref 32.0–36.0)
MCV: 94.3 fL (ref 80.0–100.0)
Platelets: 199 10*3/uL (ref 150–440)
RBC: 4.8 MIL/uL (ref 3.80–5.20)
RDW: 14.1 % (ref 11.5–14.5)
WBC: 4.8 10*3/uL (ref 3.6–11.0)

## 2015-10-01 LAB — LIPASE, BLOOD: Lipase: 29 U/L (ref 11–51)

## 2015-10-01 LAB — POCT PREGNANCY, URINE: PREG TEST UR: NEGATIVE

## 2015-10-01 MED ORDER — METOCLOPRAMIDE HCL 10 MG PO TABS
10.0000 mg | ORAL_TABLET | Freq: Once | ORAL | Status: AC
  Administered 2015-10-01: 10 mg via ORAL
  Filled 2015-10-01: qty 1

## 2015-10-01 MED ORDER — SODIUM CHLORIDE 0.9 % IV BOLUS (SEPSIS)
1000.0000 mL | Freq: Once | INTRAVENOUS | Status: AC
  Administered 2015-10-01: 1000 mL via INTRAVENOUS

## 2015-10-01 MED ORDER — ONDANSETRON HCL 4 MG/2ML IJ SOLN
4.0000 mg | Freq: Once | INTRAMUSCULAR | Status: AC
  Administered 2015-10-01: 4 mg via INTRAVENOUS
  Filled 2015-10-01: qty 2

## 2015-10-01 MED ORDER — ONDANSETRON 4 MG PO TBDP
4.0000 mg | ORAL_TABLET | Freq: Four times a day (QID) | ORAL | Status: DC | PRN
Start: 2015-10-01 — End: 2019-09-12

## 2015-10-01 MED ORDER — METOCLOPRAMIDE HCL 10 MG PO TABS: 10.0000 mg | ORAL_TABLET | Freq: Three times a day (TID) | ORAL | Status: DC | PRN

## 2015-10-01 NOTE — ED Notes (Signed)
Pt given urine cup and wipe, instructed on proper use, unable to provide sample at this time, will ring call bell when able to. Vital wnl, no acute distress noted.

## 2015-10-01 NOTE — ED Notes (Signed)
MD Quale at bedside. 

## 2015-10-01 NOTE — ED Provider Notes (Addendum)
[facility] Emergency Department Provider Note REMINDER - THIS NOTE IS NOT A FINAL MEDICAL RECORD UNTIL IT IS SIGNED. UNTIL THEN, THE CONTENT BELOW MAY REFLECT INFORMATION FROM A DOCUMENTATION TEMPLATE, NOT THE ACTUAL PATIENT VISIT. ____________________________________________  Time seen: Approximately 12:55 PM  I have reviewed the triage vital signs and the nursing notes.   HISTORY  Chief Complaint Emesis    HPI Lindsey Huang is a 34 y.o. female previous history of alcohol-induced hepatitis, and blood transfusion due to vaginal bleeding.  Patient reports here for about the last 2-3 weeks she has had occasional nausea with vomiting. She reports that she is not having any pain or discomfort. She is able to keep food down occasionally, and sometimes she will eat and then a few minutes later vomited back up. She is not having any pain. No chest pain. No trouble breathing. She is able to drink liquids pretty well, she tells me that this happened her once in March and she had to be hydrated. At that time there was concerns about liver problems.  She does tell me that she drinks occasionally but only had one glass of wine a few nights ago this week. She denies any withdrawal or tremors.  Denies. See. Denies heavy vaginal bleeding.  Past Medical History  Diagnosis Date  . Medical history non-contributory   . Hx of incompetent cervix, currently pregnant   . Blood transfusion without reported diagnosis 2010    misscarriage    Patient Active Problem List   Diagnosis Date Noted  . Indication for care in labor or delivery 12/25/2013  . NVD (normal vaginal delivery) 12/25/2013    Past Surgical History  Procedure Laterality Date  . Cervical cerclage      x4    Current Outpatient Rx  Name  Route  Sig  Dispense  Refill  . metoCLOPramide (REGLAN) 10 MG tablet   Oral   Take 1 tablet (10 mg total) by mouth every 8 (eight) hours as needed for nausea or vomiting.    30 tablet   0   . ondansetron (ZOFRAN ODT) 4 MG disintegrating tablet   Oral   Take 1 tablet (4 mg total) by mouth every 6 (six) hours as needed for nausea or vomiting.   20 tablet   0     Allergies Review of patient's allergies indicates no known allergies.  Family History  Problem Relation Age of Onset  . Depression Maternal Aunt   . Alcohol abuse Neg Hx   . Arthritis Neg Hx   . Asthma Neg Hx   . Birth defects Neg Hx   . Cancer Neg Hx   . COPD Neg Hx   . Drug abuse Neg Hx   . Early death Neg Hx   . Diabetes Neg Hx   . Hearing loss Neg Hx   . Heart disease Neg Hx   . Hyperlipidemia Neg Hx   . Hypertension Neg Hx   . Kidney disease Neg Hx   . Learning disabilities Neg Hx   . Mental illness Neg Hx   . Mental retardation Neg Hx   . Miscarriages / Stillbirths Neg Hx   . Stroke Neg Hx   . Vision loss Neg Hx     Social History Social History  Substance Use Topics  . Smoking status: Former Smoker    Types: Cigarettes    Quit date: 07/12/2013  . Smokeless tobacco: Never Used  . Alcohol Use: No    Review of  Systems Constitutional: No fever/chills Eyes: No visual changes. ENT: No sore throat. Cardiovascular: Denies chest pain. Respiratory: Denies shortness of breath. Gastrointestinal: No abdominal pain.   No diarrhea.  No constipation. Last bowel movement was this morning and normal. Genitourinary: Negative for dysuria. Musculoskeletal: Negative for back pain. Skin: Negative for rash. Neurological: Negative for headaches, focal weakness or numbness.  Denies pregnancy  10-point ROS otherwise negative.  ____________________________________________   PHYSICAL EXAM:  VITAL SIGNS: ED Triage Vitals  Enc Vitals Group     BP 10/01/15 0903 106/81 mmHg     Pulse Rate 10/01/15 0903 66     Resp 10/01/15 0903 18     Temp 10/01/15 0903 97.7 F (36.5 C)     Temp Source 10/01/15 0903 Oral     SpO2 10/01/15 0903 98 %     Weight 10/01/15 0903 142 lb (64.411 kg)      Height 10/01/15 0903 4\' 11"  (1.499 m)     Head Cir --      Peak Flow --      Pain Score --      Pain Loc --      Pain Edu? --      Excl. in GC? --    Constitutional: Alert and oriented. Well appearing and in no acute distress. Eyes: Conjunctivae are normal. PERRL. EOMI. there is no jaundice. Head: Atraumatic. Nose: No congestion/rhinnorhea. Mouth/Throat: Mucous membranes are slightly dry.  Oropharynx non-erythematous. Neck: No stridor.   Cardiovascular: Normal rate, regular rhythm. Grossly normal heart sounds.  Good peripheral circulation. Respiratory: Normal respiratory effort.  No retractions. Lungs CTAB. Gastrointestinal: Soft and nontender. No distention. No abdominal bruits. No CVA tenderness. No pain with Eulah PontMurphy. Negative for pain in McBurney's point. Musculoskeletal: No lower extremity tenderness nor edema.  No joint effusions. Neurologic:  Normal speech and language. No gross focal neurologic deficits are appreciated. No gait instability. Skin:  Skin is warm, dry and intact. No rash noted. Psychiatric: Mood and affect are normal. Speech and behavior are normal.  ____________________________________________   LABS (all labs ordered are listed, but only abnormal results are displayed)  Labs Reviewed  COMPREHENSIVE METABOLIC PANEL - Abnormal; Notable for the following:    Glucose, Bld 137 (*)    BUN 21 (*)    Calcium 8.7 (*)    AST 91 (*)    ALT 57 (*)    All other components within normal limits  URINALYSIS COMPLETEWITH MICROSCOPIC ([facility] ONLY) - Abnormal; Notable for the following:    Color, Urine YELLOW (*)    APPearance CLEAR (*)    Ketones, ur TRACE (*)    Protein, ur 30 (*)    Bacteria, UA RARE (*)    Squamous Epithelial / LPF 0-5 (*)    All other components within normal limits  LIPASE, BLOOD  CBC  POCT PREGNANCY, URINE  POC URINE PREG, ED    ____________________________________________  EKG   ____________________________________________  RADIOLOGY   ____________________________________________   PROCEDURES  Procedure(s) performed: None  Critical Care performed: No  ____________________________________________   INITIAL IMPRESSION / ASSESSMENT AND PLAN / ED COURSE  Pertinent labs & imaging results that were available during my care of the patient were reviewed by me and considered in my medical decision making (see chart for details).  Patient presents for vomiting on occasion for the last 2-3 weeks. She has no pain, is afebrile and in no distress. Her abdominal exam is very reassuring without pain or symptomatology. In the ER she is  currently taking ginger ale well and holding it down. Exact cause for her occasional nausea and vomiting is not quite clear, but we will check laboratory analysis, pancreatitis, hydrate her generously, right antibiotics and reevaluate.  No signs or symptoms of acute intra-abdominal process. Patient is able tolerate by mouth liquids well. At this point, labs are reassuring that she does have some mild transaminitis again noted. I discussed with the patient and she is agreeable with the plan to follow up closely with gastroenterology. Should she develop abdominal pain, feeling of the skin, worsening nausea and vomiting, fevers, chills, or other new concerns rash or return to the emergency room right away for reevaluation.  ----------------------------------------- 2:59 PM on 10/01/2015 -----------------------------------------  Patient reports improvement. She is currently tolerating water and [place] well without any vomiting. She reports her symptoms are resolved. Discussed follow-up with gastroenterology and primary care doctor as well as return precautions for which she is quite agreeable. Patient has no distress, stable appearing and reports feeling much  improved. ____________________________________________   FINAL CLINICAL IMPRESSION(S) / ED DIAGNOSES  Final diagnoses:  Non-intractable vomiting with nausea, vomiting of unspecified type      Sharyn Creamer, MD 10/01/15 1502  Urine pregnancy test is negative per verbal report from Lauren, patient's nurse.  Sharyn Creamer, MD 10/01/15 [phone]

## 2015-10-01 NOTE — Discharge Instructions (Signed)

## 2015-10-01 NOTE — ED Notes (Signed)
Pt presents with vomiting on and off for three weeks.

## 2017-11-20 ENCOUNTER — Emergency Department: Payer: Medicaid Other

## 2017-11-20 ENCOUNTER — Encounter: Payer: Self-pay | Admitting: *Deleted

## 2017-11-20 ENCOUNTER — Emergency Department
Admission: EM | Admit: 2017-11-20 | Discharge: 2017-11-20 | Disposition: A | Discharge status: Home / Self Care | Payer: Medicaid Other | Attending: Emergency Medicine | Admitting: Emergency Medicine

## 2017-11-20 ENCOUNTER — Other Ambulatory Visit: Payer: Self-pay

## 2017-11-20 DIAGNOSIS — S022XXA Fracture of nasal bones, initial encounter for closed fracture: Secondary | ICD-10-CM | POA: Diagnosis not present

## 2017-11-20 DIAGNOSIS — W01198A Fall on same level from slipping, tripping and stumbling with subsequent striking against other object, initial encounter: Secondary | ICD-10-CM | POA: Diagnosis not present

## 2017-11-20 DIAGNOSIS — Y9389 Activity, other specified: Secondary | ICD-10-CM | POA: Diagnosis not present

## 2017-11-20 DIAGNOSIS — F17210 Nicotine dependence, cigarettes, uncomplicated: Secondary | ICD-10-CM | POA: Diagnosis not present

## 2017-11-20 DIAGNOSIS — Z23 Encounter for immunization: Secondary | ICD-10-CM | POA: Diagnosis not present

## 2017-11-20 DIAGNOSIS — Y929 Unspecified place or not applicable: Secondary | ICD-10-CM | POA: Insufficient documentation

## 2017-11-20 DIAGNOSIS — S0993XA Unspecified injury of face, initial encounter: Secondary | ICD-10-CM | POA: Diagnosis present

## 2017-11-20 DIAGNOSIS — Y998 Other external cause status: Secondary | ICD-10-CM | POA: Insufficient documentation

## 2017-11-20 MED ORDER — HYDROCODONE-ACETAMINOPHEN 5-325 MG PO TABS: 1.0000 | ORAL_TABLET | ORAL | 0 refills | Status: AC | PRN

## 2017-11-20 MED ORDER — TETANUS-DIPHTH-ACELL PERTUSSIS 5-2.5-18.5 LF-MCG/0.5 IM SUSP
0.5000 mL | Freq: Once | INTRAMUSCULAR | Status: AC
  Administered 2017-11-20: 0.5 mL via INTRAMUSCULAR
  Filled 2017-11-20: qty 0.5

## 2017-11-20 MED ORDER — HYDROCODONE-ACETAMINOPHEN 5-325 MG PO TABS
1.0000 | ORAL_TABLET | Freq: Once | ORAL | Status: AC
  Administered 2017-11-20: 1 via ORAL
  Filled 2017-11-20: qty 1

## 2017-11-20 MED ORDER — AMOXICILLIN-POT CLAVULANATE 875-125 MG PO TABS: 1.0000 | ORAL_TABLET | Freq: Two times a day (BID) | ORAL | 0 refills | Status: DC

## 2017-11-20 MED ORDER — BACITRACIN ZINC 500 UNIT/GM EX OINT
1.0000 "application " | TOPICAL_OINTMENT | Freq: Once | CUTANEOUS | Status: AC
  Administered 2017-11-20: 1 via TOPICAL
  Filled 2017-11-20: qty 0.9

## 2017-11-20 NOTE — ED Notes (Signed)
"  L" shaped abrasion on bridge of nose.
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
## 2017-11-20 NOTE — Discharge Instructions (Signed)
Please apply antibiotic ointment to the wound on your nose 2 times per day.  Call on Monday to schedule a follow up appointment with the ENT doctor.
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
## 2018-09-16 ENCOUNTER — Encounter (HOSPITAL_COMMUNITY): Payer: Self-pay | Admitting: Emergency Medicine

## 2018-09-16 ENCOUNTER — Emergency Department (HOSPITAL_COMMUNITY)
Admission: EM | Admit: 2018-09-16 | Discharge: 2018-09-17 | Disposition: A | Discharge status: Home / Self Care | Payer: Medicaid Other | Attending: Emergency Medicine | Admitting: Emergency Medicine

## 2018-09-16 DIAGNOSIS — Y929 Unspecified place or not applicable: Secondary | ICD-10-CM | POA: Insufficient documentation

## 2018-09-16 DIAGNOSIS — Y939 Activity, unspecified: Secondary | ICD-10-CM | POA: Insufficient documentation

## 2018-09-16 DIAGNOSIS — T23142A Burn of first degree of multiple left fingers (nail), including thumb, initial encounter: Secondary | ICD-10-CM

## 2018-09-16 DIAGNOSIS — T23012A Burn of unspecified degree of left thumb (nail), initial encounter: Secondary | ICD-10-CM | POA: Diagnosis present

## 2018-09-16 DIAGNOSIS — Y999 Unspecified external cause status: Secondary | ICD-10-CM | POA: Diagnosis not present

## 2018-09-16 DIAGNOSIS — Z77098 Contact with and (suspected) exposure to other hazardous, chiefly nonmedicinal, chemicals: Secondary | ICD-10-CM

## 2018-09-16 DIAGNOSIS — X19XXXA Contact with other heat and hot substances, initial encounter: Secondary | ICD-10-CM | POA: Insufficient documentation

## 2018-09-16 MED ORDER — SILVER SULFADIAZINE 1 % EX CREA: 1.0000 "application " | TOPICAL_CREAM | Freq: Every day | CUTANEOUS | 0 refills | Status: DC

## 2018-09-16 MED ORDER — SILVER SULFADIAZINE 1 % EX CREA
TOPICAL_CREAM | Freq: Once | CUTANEOUS | Status: AC
  Administered 2018-09-17: via TOPICAL
  Filled 2018-09-16: qty 85
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
## 2018-09-16 NOTE — Discharge Instructions (Signed)
Please return to the Emergency Department for any new or worsening symptoms or if your symptoms do not improve. Please be sure to follow up with your Primary Care Physician as soon as possible regarding your visit today. If you do not have a Primary Doctor please use the resources below to establish one. Please use the silver sulfadiazine cream as prescribed to help with your burn.  Do not attempt to open blister.  Please follow-up with your primary care provider as soon as possible.  Please keep the area clean and dry and monitor for signs of infection.  Contact a health care provider if: You received a tetanus shot and you have swelling, severe pain, redness, or bleeding at the injection site. Your symptoms do not improve with treatment. Your pain is not controlled with medicine. You have more redness, swelling, or pain around your burn. Your burn feels warm to the touch. Your burn changes in appearance or develops black or red spots. Get help right away if: You develop red streaks near the burn. You have fluid, blood, or pus coming from the burn. You develop severe swelling. You develop severe pain. You notice a bad smell coming from your burn. You have a fever. You have numbness or tingling in the burned area or further down your legs or arms. You have trouble breathing. You develop coughing or wheezing. You have chest pain.  Do not take your medicine if  develop an itchy rash, swelling in your mouth or lips, or difficulty breathing.   RESOURCE GUIDE  Chronic Pain Problems: Contact [facility]  [phone] Patients need to be referred by their primary care doctor.  Insufficient Money for Medicine: Contact United Way:  call "211" or [facility] 443-159-8685.  No Primary Care Doctor: Call [facility]  [phone] - can help you locate a primary care doctor that  accepts your insurance, provides certain services, etc. Physician Referral Service-  [phone]  Agencies that provide inexpensive medical care: [facility]  [phone] [facility]  [phone] [facility]  [phone] [facility]  [phone] [facility]  [phone] [facility]  [phone]  Medicaid-accepting [facility] Providers: [facility]- [address]  [phone], Mon-Fri 9am-7pm, Sat 9am-1pm [facility]- [address] [place], Suite [place]  [phone] [facility]- [address] [place]  [phone] [facility]- [address]  [phone] Renaye Rakers- [address] [place], [address], [phone]  Only accepts [place] patients after they have their name  applied to their card  Self Pay (no insurance) in [facility]: Sickle Cell Patients: Dr Willey Blade, [facility] Internal Medicine  [address] [place], [phone] [facility]- [address] [place]  [phone]       [facility] [place]- [address]       -     [facility]- see information above (Speak to [employer] if you do not have insurance)       -  [facility]- [address] [place], [phone]       -  [facility]- [address],  [phone]       -  [facility]- [address], [phone]       -  Dr Julio Sicks-  [phone]  [address], [place], [phone]       -  [facility]- [address], [phone]       -  [facility]- [address] [phone], also [address], [phone]       -    [facility]- [address] [place], [phone], 1st & 3rd Saturday   every month, 10am-1pm  1) Find a Doctor and Pay Out of Pocket Although you won't have to find out who is covered by your insurance plan, it is a good idea to ask around and get recommendations. You will then need to call the office and see if the doctor you  have chosen will accept you as a new patient and what types of options they offer for patients who are self-pay. Some doctors offer discounts or will set up payment plans for their patients who do not have insurance, but you will need to ask so you aren't surprised when you get to your appointment.  2) Contact Your Local Health Department Not all health departments have doctors that can see patients for sick visits, but many do, so it is worth a call to see if yours does. If you don't know where your local health department is, you can check in your phone book. The CDC also has a tool to help you locate your state's health department, and many state websites also have listings of all of their local health departments.  3) Find a Walk-in Clinic If your illness is not likely to be very severe or complicated, you may want to try a walk in clinic. These are popping up all over the country in pharmacies, drugstores, and shopping centers. They're usually staffed by nurse practitioners or physician assistants that have been trained to treat common illnesses and complaints. They're usually fairly quick and inexpensive. However, if you have serious medical issues or chronic medical problems, these are probably not your best option  STD Testing [facility] Department of [facility], STD Clinic, [address], [place], phone [phone] or [phone].  Monday - Friday, call for an appointment. [facility] Department of [employer], STD Clinic, [place] [address], [place], phone [phone] or [phone].  Monday - Friday, call for an appointment.  Abuse/Neglect: [facility] Child Abuse Hotline (613) 596-8056 [facility] Child Abuse Hotline 340-879-8377 (After Hours)  Emergency Shelter:  [facility] (636)094-0249  Maternity Homes: [facility] at the [place] [facility] 956-733-8256 [facility] [phone]  MRSA Hotline #:    276-386-3617  [facility] Resources  [facility] [place]  United Way [facility] Dept. [address]                 [address]         [address]  [place]                                               Cristobal Goldmann Phone:  [phone]  Phone:  [phone]                   Phone:  [place], [place]- [phone]       -     [facility] in [place], [address]                                  [place] [phone] or [phone] (After Hours)   [place]  Substance Abuse Resources: Alcohol and Drug Services  360-205-1511 [place] 2504862001 [facility] [place] 501 425 3739 Residential & Outpatient Substance Abuse Program  579-007-3980  Psychological Services: [place]  ([phone] [place]  [place], [place]. [address] [place], [place]: [phone] or [phone], [employer]  Dental Assistance  If unable to pay or uninsured, contact:  [facility] or [facility]. to become qualified for the adult dental clinic.  Patients with Medicaid: [facility] [phone] [address], [place] [address], [phone]  If unable to pay, or uninsured, contact [facility] (863)622-0772) or [employer] 6235274445 in [place], [place] in [facility]) to become qualified for the adult dental clinic   Other [place]- [place], [place], [place], [address], [place], Start, 2nd and 4th Thursday of the month at 6:30am.  10 clients each day  by appointment, can sometimes see walk-in patients if someone does not show for an appointment. [facility]- [address] [place], [place], [address], [place], [place], [place], [address], [place] Department- [phone] [place] [phone]

## 2018-10-08 ENCOUNTER — Other Ambulatory Visit: Payer: Self-pay

## 2018-10-08 ENCOUNTER — Emergency Department
Admission: EM | Admit: 2018-10-08 | Discharge: 2018-10-08 | Disposition: A | Discharge status: Home / Self Care | Payer: Medicaid Other | Attending: Emergency Medicine | Admitting: Emergency Medicine

## 2018-10-08 DIAGNOSIS — Y9389 Activity, other specified: Secondary | ICD-10-CM | POA: Diagnosis not present

## 2018-10-08 DIAGNOSIS — R51 Headache: Secondary | ICD-10-CM | POA: Diagnosis not present

## 2018-10-08 DIAGNOSIS — Y92410 Unspecified street and highway as the place of occurrence of the external cause: Secondary | ICD-10-CM | POA: Insufficient documentation

## 2018-10-08 DIAGNOSIS — Y999 Unspecified external cause status: Secondary | ICD-10-CM | POA: Insufficient documentation

## 2018-10-08 DIAGNOSIS — M545 Low back pain: Secondary | ICD-10-CM | POA: Insufficient documentation

## 2018-10-08 DIAGNOSIS — M542 Cervicalgia: Secondary | ICD-10-CM | POA: Diagnosis present

## 2018-10-08 DIAGNOSIS — Z79899 Other long term (current) drug therapy: Secondary | ICD-10-CM | POA: Diagnosis not present

## 2018-10-08 DIAGNOSIS — F17210 Nicotine dependence, cigarettes, uncomplicated: Secondary | ICD-10-CM | POA: Insufficient documentation

## 2018-10-08 MED ORDER — NABUMETONE 750 MG PO TABS: 750.0000 mg | ORAL_TABLET | Freq: Two times a day (BID) | ORAL | 0 refills | Status: DC

## 2018-10-08 MED ORDER — ACETAMINOPHEN 325 MG PO TABS
650.0000 mg | ORAL_TABLET | Freq: Once | ORAL | Status: AC
  Administered 2018-10-08: 650 mg via ORAL
  Filled 2018-10-08: qty 2

## 2018-10-08 MED ORDER — METAXALONE 800 MG PO TABS: 800.0000 mg | ORAL_TABLET | Freq: Three times a day (TID) | ORAL | 0 refills | Status: AC
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
## 2019-09-10 ENCOUNTER — Encounter: Payer: Self-pay | Admitting: Intensive Care

## 2019-09-10 ENCOUNTER — Other Ambulatory Visit: Payer: Self-pay

## 2019-09-10 ENCOUNTER — Emergency Department: Payer: Medicaid Other

## 2019-09-10 ENCOUNTER — Inpatient Hospital Stay
Admission: EM | Admit: 2019-09-10 | Discharge: 2019-09-12 | DRG: 442 | Disposition: A | Discharge status: Other Acute Inpatient Hospital | Payer: Medicaid Other | Attending: Internal Medicine | Admitting: Internal Medicine

## 2019-09-10 DIAGNOSIS — K7010 Alcoholic hepatitis without ascites: Secondary | ICD-10-CM | POA: Diagnosis present

## 2019-09-10 DIAGNOSIS — R188 Other ascites: Secondary | ICD-10-CM | POA: Diagnosis present

## 2019-09-10 DIAGNOSIS — Z331 Pregnant state, incidental: Secondary | ICD-10-CM | POA: Diagnosis present

## 2019-09-10 DIAGNOSIS — Z20828 Contact with and (suspected) exposure to other viral communicable diseases: Secondary | ICD-10-CM | POA: Diagnosis present

## 2019-09-10 DIAGNOSIS — K7200 Acute and subacute hepatic failure without coma: Secondary | ICD-10-CM

## 2019-09-10 DIAGNOSIS — M791 Myalgia, unspecified site: Secondary | ICD-10-CM | POA: Diagnosis present

## 2019-09-10 DIAGNOSIS — F1010 Alcohol abuse, uncomplicated: Secondary | ICD-10-CM | POA: Diagnosis present

## 2019-09-10 DIAGNOSIS — K7290 Hepatic failure, unspecified without coma: Secondary | ICD-10-CM | POA: Diagnosis present

## 2019-09-10 DIAGNOSIS — N883 Incompetence of cervix uteri: Secondary | ICD-10-CM | POA: Diagnosis present

## 2019-09-10 DIAGNOSIS — T391X1A Poisoning by 4-Aminophenol derivatives, accidental (unintentional), initial encounter: Secondary | ICD-10-CM | POA: Diagnosis present

## 2019-09-10 DIAGNOSIS — Z818 Family history of other mental and behavioral disorders: Secondary | ICD-10-CM

## 2019-09-10 DIAGNOSIS — F17210 Nicotine dependence, cigarettes, uncomplicated: Secondary | ICD-10-CM | POA: Diagnosis present

## 2019-09-10 DIAGNOSIS — E876 Hypokalemia: Secondary | ICD-10-CM | POA: Diagnosis present

## 2019-09-10 LAB — PROTIME-INR
INR: 2.3 — ABNORMAL HIGH (ref 0.8–1.2)
Prothrombin Time: 24.6 seconds — ABNORMAL HIGH (ref 11.4–15.2)

## 2019-09-10 LAB — HEPATITIS PANEL, ACUTE
HCV Ab: NONREACTIVE
Hep A IgM: NONREACTIVE
Hep B C IgM: NONREACTIVE
Hepatitis B Surface Ag: NONREACTIVE

## 2019-09-10 LAB — HEPATIC FUNCTION PANEL
ALT: 23 U/L (ref 0–44)
AST: 111 U/L — ABNORMAL HIGH (ref 15–41)
Albumin: 2 g/dL — ABNORMAL LOW (ref 3.5–5.0)
Alkaline Phosphatase: 212 U/L — ABNORMAL HIGH (ref 38–126)
Bilirubin, Direct: 13 mg/dL — ABNORMAL HIGH (ref 0.0–0.2)
Indirect Bilirubin: 12.5 mg/dL — ABNORMAL HIGH (ref 0.3–0.9)
Total Bilirubin: 25.5 mg/dL (ref 0.3–1.2)
Total Protein: 6.3 g/dL — ABNORMAL LOW (ref 6.5–8.1)

## 2019-09-10 LAB — BASIC METABOLIC PANEL
Anion gap: 14 (ref 5–15)
BUN: 5 mg/dL — ABNORMAL LOW (ref 6–20)
CO2: 25 mmol/L (ref 22–32)
Calcium: 7.9 mg/dL — ABNORMAL LOW (ref 8.9–10.3)
Chloride: 90 mmol/L — ABNORMAL LOW (ref 98–111)
Creatinine, Ser: UNDETERMINED mg/dL (ref 0.44–1.00)
Glucose, Bld: 108 mg/dL — ABNORMAL HIGH (ref 70–99)
Potassium: 2.8 mmol/L — ABNORMAL LOW (ref 3.5–5.1)
Sodium: 129 mmol/L — ABNORMAL LOW (ref 135–145)

## 2019-09-10 LAB — URINALYSIS, COMPLETE (UACMP) WITH MICROSCOPIC: Specific Gravity, Urine: 1.012 (ref 1.005–1.030)

## 2019-09-10 LAB — CBC
HCT: 31.8 % — ABNORMAL LOW (ref 36.0–46.0)
Hemoglobin: 11 g/dL — ABNORMAL LOW (ref 12.0–15.0)
MCH: 35.9 pg — ABNORMAL HIGH (ref 26.0–34.0)
MCHC: 34.6 g/dL (ref 30.0–36.0)
MCV: 103.9 fL — ABNORMAL HIGH (ref 80.0–100.0)
Platelets: 212 10*3/uL (ref 150–400)
RBC: 3.06 MIL/uL — ABNORMAL LOW (ref 3.87–5.11)
RDW: 18.4 % — ABNORMAL HIGH (ref 11.5–15.5)
WBC: 23.3 10*3/uL — ABNORMAL HIGH (ref 4.0–10.5)
nRBC: 0 % (ref 0.0–0.2)

## 2019-09-10 LAB — URINE DRUG SCREEN, QUALITATIVE (ARMC ONLY)
Amphetamines, Ur Screen: NOT DETECTED
Barbiturates, Ur Screen: NOT DETECTED
Benzodiazepine, Ur Scrn: NOT DETECTED
Cannabinoid 50 Ng, Ur ~~LOC~~: NOT DETECTED
Cocaine Metabolite,Ur ~~LOC~~: NOT DETECTED
MDMA (Ecstasy)Ur Screen: NOT DETECTED
Methadone Scn, Ur: NOT DETECTED
Opiate, Ur Screen: NOT DETECTED
Phencyclidine (PCP) Ur S: NOT DETECTED
Tricyclic, Ur Screen: NOT DETECTED

## 2019-09-10 LAB — CREATININE, SERUM: Creatinine, Ser: UNDETERMINED mg/dL (ref 0.44–1.00)

## 2019-09-10 LAB — LIPASE, BLOOD: Lipase: 55 U/L — ABNORMAL HIGH (ref 11–51)

## 2019-09-10 LAB — ETHANOL: Alcohol, Ethyl (B): <10 mg/dL (ref <10)

## 2019-09-10 LAB — TROPONIN I (HIGH SENSITIVITY)
Troponin I (High Sensitivity): 8 ng/L (ref <18)
Troponin I (High Sensitivity): 7 ng/L (ref <18)

## 2019-09-10 LAB — SALICYLATE LEVEL: Salicylate Lvl: UNDETERMINED mg/dL (ref 2.8–30.0)

## 2019-09-10 LAB — ACETAMINOPHEN LEVEL: Acetaminophen (Tylenol), Serum: <10 ug/mL — ABNORMAL LOW (ref 10–30)

## 2019-09-10 MED ORDER — DEXTROSE 5 % IV SOLN
15.0000 mg/kg/h | INTRAVENOUS | Status: DC
  Administered 2019-09-10 – 2019-09-11 (×2): 15 mg/kg/h via INTRAVENOUS
  Filled 2019-09-10 (×4): qty 120

## 2019-09-10 MED ORDER — ACETYLCYSTEINE LOAD VIA INFUSION
150.0000 mg/kg | Freq: Once | INTRAVENOUS | Status: AC
  Administered 2019-09-10: 11880 mg via INTRAVENOUS
  Filled 2019-09-10: qty 297

## 2019-09-10 MED ORDER — SODIUM CHLORIDE 0.9 % IV SOLN
Freq: Once | INTRAVENOUS | Status: AC
  Administered 2019-09-10: 20:00:00 via INTRAVENOUS

## 2019-09-10 MED ORDER — ALBUMIN HUMAN 25 % IV SOLN
0.0000 g | Freq: Once | INTRAVENOUS | Status: DC
  Filled 2019-09-10: qty 400

## 2019-09-10 NOTE — ED Notes (Signed)
Pt provided with a sandwich tray and water.

## 2019-09-10 NOTE — ED Notes (Signed)
[place]  CALLED  FOR TRANSFER
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
## 2019-09-10 NOTE — ED Notes (Addendum)
Pt reports fatigue, fever, N/V Friday night 10/8; pt started taking "liquid tylenol extra strength" q4hr "15-15ml" along with "nighttime TheraFlu". . Pt c/o epigastric pain since Saturday, swollen abd, dry mouth. SOB when lying and sitting. St swelling and gaining over 10lbs since Trhusday 9/15 .

## 2019-09-10 NOTE — ED Notes (Signed)
This RN walked to the lab to obtain lab results, this RN was told by [facility] lab tech results will be faxed to ED room.

## 2019-09-10 NOTE — ED Provider Notes (Signed)
[facility] Emergency Department Provider Note  Time seen: 2:57 PM  I have reviewed the triage vital signs and the nursing notes.   HISTORY  Chief Complaint Abdominal Pain and Jaundice   HPI Lindsey Huang is a 38 y.o. female with no significant past medical history presents to the emergency department for jaundice skin, yellowing of her eyes and a swollen abdomen.  According to the patient over the past 3 to 4 days she has had swelling of her abdomen yellowing of her skin over the past 2 days has noticed yellowing in her eyes as well.  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  States since then she drinks extremely rarely she states she may have 1 or 2 drinks once or twice a month.  The patient's last alcoholic drink she states was September 12.  Patient states last week however she did have diarrhea and congestion, was taking extra strength Tylenol liquid formulation as well as TheraFlu severe cold every 4 hours.  Patient denies any recent fever denies any cough or current congestion.  Patient denies any abdominal pain but states it is feeling very tight and swollen.   Past Medical History:  Diagnosis Date  . Blood transfusion without reported diagnosis 2010   misscarriage  . Hx of incompetent cervix, currently pregnant   . Medical history non-contributory     Patient Active Problem List   Diagnosis Date Noted  . Indication for care in labor or delivery 12/25/2013  . NVD (normal vaginal delivery) 12/25/2013    Past Surgical History:  Procedure Laterality Date  . CERVICAL CERCLAGE     x4    Prior to Admission medications   Medication Sig Start Date End Date Taking? Authorizing Provider  amoxicillin-clavulanate (AUGMENTIN) 875-125 MG tablet Take 1 tablet by mouth 2 (two) times  daily. Patient not taking: Reported on 09/10/2019 11/20/17   Kem Boroughs B, FNP  metoCLOPramide (REGLAN) 10 MG tablet Take 1 tablet (10 mg total) by mouth every 8 (eight) hours as needed for nausea or vomiting. 10/01/15 09/30/16  Sharyn Creamer, MD  nabumetone (RELAFEN) 750 MG tablet Take 1 tablet (750 mg total) by mouth 2 (two) times daily. Patient not taking: Reported on 09/10/2019 10/08/18   Menshew, Charlesetta Ivory, PA-C  ondansetron (ZOFRAN ODT) 4 MG disintegrating tablet Take 1 tablet (4 mg total) by mouth every 6 (six) hours as needed for nausea or vomiting. Patient not taking: Reported on 09/10/2019 10/01/15   Sharyn Creamer, MD  silver sulfADIAZINE (SILVADENE) 1 % cream Apply 1 application topically daily. Patient not taking: Reported on 09/10/2019 09/16/18   Harlene Salts A, PA-C    No Known Allergies  Family History  Problem Relation Age of Onset  . Depression Maternal Aunt   . Alcohol abuse Neg Hx   . Arthritis Neg Hx   . Asthma Neg Hx   . Birth defects Neg Hx   . Cancer Neg Hx   . COPD Neg Hx   . Drug abuse Neg Hx   . Early death Neg Hx   . Diabetes Neg Hx   . Hearing loss Neg Hx   . Heart disease Neg Hx   . Hyperlipidemia Neg Hx   . Hypertension Neg Hx   . Kidney disease Neg Hx   .  Learning disabilities Neg Hx   . Mental illness Neg Hx   . Mental retardation Neg Hx   . Miscarriages / Stillbirths Neg Hx   . Stroke Neg Hx   . Vision loss Neg Hx     Social History Social History   Tobacco Use  . Smoking status: Current Every Day Smoker    Types: Cigarettes    Last attempt to quit: 07/12/2013    Years since quitting: 6.1  . Smokeless tobacco: Never Used  Substance Use Topics  . Alcohol use: Yes    Comment: occ  . Drug use: No    Review of Systems Constitutional: Negative for fever. Cardiovascular: Negative for chest pain. Respiratory: Negative for shortness of breath. Gastrointestinal: Positive for abdominal swelling and tightness.  Negative for vomiting.   Diarrhea last week but none this week. Musculoskeletal: Negative for musculoskeletal complaints Skin: Yellowing of her skin.  Mild itching. Neurological: Negative for headache All other ROS negative  ____________________________________________   PHYSICAL EXAM:  VITAL SIGNS: ED Triage Vitals  Enc Vitals Group     BP 09/10/19 1315 (!) 97/49     Pulse Rate 09/10/19 1315 93     Resp 09/10/19 1315 (!) 22     Temp 09/10/19 1315 98.7 F (37.1 C)     Temp Source 09/10/19 1315 Oral     SpO2 09/10/19 1315 100 %     Weight 09/10/19 1328 174 lb 11.2 oz (79.2 kg)     Height 09/10/19 1316 4\' 11"  (1.499 m)     Head Circumference --      Peak Flow --      Pain Score 09/10/19 1316 6     Pain Loc --      Pain Edu? --      Excl. in GC? --    Constitutional: Patient is awake alert and oriented.  Quite jaundiced in appearance. Eyes: Scleral icterus. ENT      Head: Normocephalic and atraumatic.      Mouth/Throat: Mucous membranes are moist. Cardiovascular: Normal rate, regular rhythm around 100 bpm. Respiratory: Normal respiratory effort without tachypnea nor retractions. Breath sounds are clear Gastrointestinal: Patient has a significantly distended abdomen, dull percussion soft to palpation and nontender. Musculoskeletal: Nontender with normal range of motion in all extremities.  Neurologic:  Normal speech and language. No gross focal neurologic deficits  Skin:  Skin is warm, dry and intact.  Psychiatric: Mood and affect are normal.   ____________________________________________    EKG  EKG viewed and interpreted by myself shows a normal sinus rhythm at 99 bpm with a narrow QRS, left axis deviation, largely normal intervals, nonspecific ST changes.  ____________________________________________    RADIOLOGY  Chest x-ray is negative  ____________________________________________   INITIAL IMPRESSION / ASSESSMENT AND PLAN / ED COURSE  Pertinent labs & imaging results that were  available during my care of the patient were reviewed by me and considered in my medical decision making (see chart for details).   Patient presents to the emergency department for abdominal swelling yellowing of her skin, itching of her skin and yellow eyes.  Patient appears to be in liver failure with an abdomen consistent with ascites with significant fluid accumulation as well as significant jaundice and scleral icterus.  Patient denies any recent alcohol use.  Denies intentionally overdosing on any medications.  Denies any drug use.  During the patient's recent upper respiratory/diarrheal illness the patient does admit to taking liquid Tylenol extra strength every 4 hours as  well as TheraFlu severe cold every 4 hours for a couple days.  This would equate to nearly 9900 mg of Tylenol if taken together every 4 hours.  Given the patient's history of elevated bilirubin previously in 2016 which at that time was blamed on alcoholic hepatitis, she could have a weakened liver at baseline, and the significant Tylenol ingestion of the the last week could have been enough to induce liver failure potentially.  Patient does have a gallbladder, within the differential would also be gallbladder disease leading to jaundice however this would be less likely to lead to significant ascites.  Lab work has began to result showing an elevated white blood cell count of 23,000, total bilirubin is resulted at 25.  The remainder of the liver function panel including AST/ALT are pending.  We will obtain a right upper quadrant ultrasound as well.  Patient care signed out to oncoming physician.  Patient may require transfer if she is in significant liver failure.  Maytal Mijangos was evaluated in Emergency Department on 09/10/2019 for the symptoms described in the history of present illness. She was evaluated in the context of the global COVID-19 pandemic, which necessitated consideration that the patient might be at risk for infection  with the SARS-CoV-2 virus that causes COVID-19. Institutional protocols and algorithms that pertain to the evaluation of patients at risk for COVID-19 are in a state of rapid change based on information released by regulatory bodies including the CDC and federal and state organizations. These policies and algorithms were followed during the patient's care in the ED.  ____________________________________________   FINAL CLINICAL IMPRESSION(S) / ED DIAGNOSES  Liver failure   Harvest Dark, MD 09/10/19 1504

## 2019-09-10 NOTE — ED Notes (Signed)
Pt provided with water per pt's request. Pt does not voice additinal needs. This RN will continue to monitor. No new orders at this time.

## 2019-09-10 NOTE — Consult Note (Signed)
MEDICATION RELATED CONSULT NOTE - INITIAL   Pharmacy Consult for Acetylcysteine antidote Indication: Suspected APAP toxicity  No Known Allergies  Patient Measurements: Height: 4\' 11"  (149.9 cm) Weight: 174 lb 11.2 oz (79.2 kg) IBW/kg (Calculated) : 43.2  Vital Signs: Temp: 98.7 F (37.1 C) (10/19 1315) Temp Source: Oral (10/19 1315) BP: 103/71 (10/19 1630) Pulse Rate: 97 (10/19 1530)  Labs: Recent Labs    09/10/19 1320  WBC 23.3*  HGB 11.0*  HCT 31.8*  PLT 212  CREATININE UNABLE T O REPORT DUE TO ICTERUS  ALBUMIN 2.0*  PROT 6.3*  AST 111*  ALT 23  ALKPHOS 212*  BILITOT 25.5*  BILIDIR 13.0*  IBILI 12.5*   CrCl cannot be calculated (This lab value cannot be used to calculate CrCl because it is not a number: UNABLE T O REPORT DUE TO ICTERUS).   Medical History: Past Medical History:  Diagnosis Date  . Blood transfusion without reported diagnosis 2010   misscarriage  . Hx of incompetent cervix, currently pregnant   . Medical history non-contributory      Assessment: 38 y/o F presented to ED with jaundiced appearance, yellowing of eyes, and swollen abdomen. 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 Current reported alcohol use is only 1-2 drinks once or twice per month and last drink in September. She endorses recent illness for which she took multiple acetaminophen containing cough and cold medicines which in doses and frequency reported are suspicious for levels far above daily recommendation limits. Acetylcysteine antidote ordered.   Acetaminophen level <10. Last known ingestion >24h. Plan to proceed with treatment per MD. Patient awaiting transfer to tertiary care center.   Goal of Therapy:  Replenishment of hepatic glutathione stores to enhance nontoxic sulfate conjugation  Plan:  Acetylcysteine 11,880 (150 mg/kg) LD over 1 hour followed by 15 mg/kg/hr per acetaminophen overdose order set. Monitor for anaphylaxis to  treatment.   Welcome Resident 09/10/2019,6:18 PM

## 2019-09-10 NOTE — ED Triage Notes (Signed)
10/14 patient started feeling lethargic, rash on chest, and constipated. 10/15 had severe abdominal swelling. 10/18 started having jaundice all over and sob due to severe swelling. Patient has pain right in center of sternum

## 2019-09-10 NOTE — ED Notes (Signed)
ED Provider Isaacs at bedside. 

## 2019-09-10 NOTE — ED Notes (Signed)
[facility]  CALLED  PER  MD 

## 2019-09-10 NOTE — ED Provider Notes (Addendum)
Assumed care from Dr. Kerman Passey at 3 PM. Briefly, the patient is a 38 y.o. female with PMHx of  has a past medical history of Blood transfusion without reported diagnosis (2010), incompetent cervix, currently pregnant, and Medical history non-contributory. here with abdominal distension and jaundice. Labs pending. Concern for tylenol-induced liver injury.   Labs Reviewed  BASIC METABOLIC PANEL - Abnormal; Notable for the following components:      Result Value   Sodium 129 (*)    Potassium 2.8 (*)    Chloride 90 (*)    Glucose, Bld 108 (*)    BUN 5 (*)    Calcium 7.9 (*)    All other components within normal limits  CBC - Abnormal; Notable for the following components:   WBC 23.3 (*)    RBC 3.06 (*)    Hemoglobin 11.0 (*)    HCT 31.8 (*)    MCV 103.9 (*)    MCH 35.9 (*)    RDW 18.4 (*)    All other components within normal limits  HEPATIC FUNCTION PANEL  LIPASE, BLOOD  ACETAMINOPHEN LEVEL  PROTIME-INR  HEPATITIS PANEL, ACUTE  TROPONIN I (HIGH SENSITIVITY)  TROPONIN I (HIGH SENSITIVITY)    Course of Care: -Lab work reviewed.  Total protein 6.3 with albumin of 2.0.  AST 111, ALT 23.  Alk phos 212, total bili of 25.5 with 13.0 direct, 12.5 and direct.  Lipase is minimally elevated at 55.  Ultrasound shows diffusely increased liver echogenicity with coarse echotexture, as well as mild gallbladder wall thickening, and right upper quadrant ascites.  She has no significant obstructive stones noted or common bile duct dilatation, doubt cholecystitis clinically.  Will plan to discuss with GI, likely will need N-acetylcysteine and transfer. -Discussed with both [facility] as well as [facility].  [facility] is on diversion.  Dr. Manuella Ghazi at [facility] has accepted the patient.  Labs are concerning for possible underlying chronic liver disease given her macrocytic anemia with elevated INR and AST to ALT ratio raising concern for underlying alcohol abuse, though she is adamant she does not drink regularly.  Patient will be  transferred.  I have added on blood cultures, as well as salicylate level.  Serum creatinine repeated.  N acetylcysteine started. -ADDENDUM: Unfortunately, notified that [facility] no longer has beds available. Pt will be on wait list at [facility] under Dr. Angela Adam. If [facility] remains full in AM, [facility] recommends calling back in AM. [facility] also recommends paracentesis for evaluation of possible SBP. After obtaining informed consent, para performed by myself - tolerated well with no bleeding, no ascites leak. U/S guided. Fluid pending. Plan to admit to [facility] pending bed availability.  .Critical Care Performed by: Duffy Bruce, MD Authorized by: Duffy Bruce, MD   Critical care provider statement:    Critical care time (minutes):  45   Critical care time was exclusive of:  Separately billable procedures and treating other patients and teaching time   Critical care was necessary to treat or prevent imminent or life-threatening deterioration of the following conditions:  Circulatory failure, cardiac failure, respiratory failure and metabolic crisis   Critical care was time spent personally by me on the following activities:  Development of treatment plan with patient or surrogate, discussions with consultants, evaluation of patient's response to treatment, examination of patient, obtaining history from patient or surrogate, ordering and performing treatments and interventions, ordering and review of laboratory studies, ordering and review of radiographic studies, pulse oximetry, re-evaluation of patient's condition and review of old charts  I assumed direction of critical care for this patient from another provider in my specialty: no   ABDOMINAL PARACENTESIS  Date/Time: 09/11/2019 1:49 AM Performed by: Duffy Bruce, MD Authorized by: Duffy Bruce, MD  Consent: Written consent obtained. Risks and benefits: risks, benefits and alternatives were discussed Consent given by: patient Patient understanding:  patient states understanding of the procedure being performed Patient consent: the patient's understanding of the procedure matches consent given Procedure consent: procedure consent matches procedure scheduled Relevant documents: relevant documents present and verified Test results: test results available and properly labeled Site marked: the operative site was marked Imaging studies: imaging studies available Required items: required blood products, implants, devices, and special equipment available Patient identity confirmed: arm band Time out: Immediately prior to procedure a "time out" was called to verify the correct patient, procedure, equipment, support staff and site/side marked as required. Preparation: Patient was prepped and draped in the usual sterile fashion. Local anesthesia used: yes  Anesthesia: Local anesthesia used: yes Local Anesthetic: lidocaine 1% without epinephrine Anesthetic total: 5 mL  Sedation: Patient sedated: no  Patient tolerance: patient tolerated the procedure well with no immediate complications Comments: Ultrasound-guidance used to find fluid pocket >4 cm in depth. Site marked, procedure performed. 4L of yellow-green clear fluid drained. Sterile dressing applied. Z-Technique employed with no apparent bleeding or ascitic fluid leak at site after catheter removed.      Duffy Bruce, MD 09/10/19 Ilsa Iha    Duffy Bruce, MD 09/11/19 [phone]

## 2019-09-10 NOTE — ED Notes (Signed)
Pt st 9 years ago pt was Rx  Percocet for 3 months for a "pinch nerve on R/shoulder; 3 months of Vicodin and tramadol for 6 weeks; 800mg  Iburpofen.

## 2019-09-10 NOTE — ED Notes (Signed)
This [facility] pharmacy requesting medication due at 1700.

## 2019-09-10 NOTE — ED Notes (Signed)
ED Provider [place]  at bedside. 

## 2019-09-10 NOTE — ED Notes (Signed)
THis RN confirmed lipase and hepatic lab add-on, per lab tech lab work is "in process"

## 2019-09-11 DIAGNOSIS — K7200 Acute and subacute hepatic failure without coma: Principal | ICD-10-CM

## 2019-09-11 DIAGNOSIS — F1010 Alcohol abuse, uncomplicated: Secondary | ICD-10-CM | POA: Diagnosis present

## 2019-09-11 DIAGNOSIS — R188 Other ascites: Secondary | ICD-10-CM | POA: Diagnosis present

## 2019-09-11 DIAGNOSIS — K7290 Hepatic failure, unspecified without coma: Secondary | ICD-10-CM | POA: Diagnosis present

## 2019-09-11 DIAGNOSIS — T391X1A Poisoning by 4-Aminophenol derivatives, accidental (unintentional), initial encounter: Secondary | ICD-10-CM | POA: Diagnosis present

## 2019-09-11 DIAGNOSIS — Z331 Pregnant state, incidental: Secondary | ICD-10-CM | POA: Diagnosis present

## 2019-09-11 DIAGNOSIS — M791 Myalgia, unspecified site: Secondary | ICD-10-CM | POA: Diagnosis present

## 2019-09-11 DIAGNOSIS — F17210 Nicotine dependence, cigarettes, uncomplicated: Secondary | ICD-10-CM | POA: Diagnosis present

## 2019-09-11 DIAGNOSIS — K7010 Alcoholic hepatitis without ascites: Secondary | ICD-10-CM | POA: Diagnosis present

## 2019-09-11 DIAGNOSIS — E876 Hypokalemia: Secondary | ICD-10-CM | POA: Diagnosis present

## 2019-09-11 DIAGNOSIS — Z20828 Contact with and (suspected) exposure to other viral communicable diseases: Secondary | ICD-10-CM | POA: Diagnosis present

## 2019-09-11 DIAGNOSIS — Z818 Family history of other mental and behavioral disorders: Secondary | ICD-10-CM | POA: Diagnosis not present

## 2019-09-11 DIAGNOSIS — N883 Incompetence of cervix uteri: Secondary | ICD-10-CM | POA: Diagnosis present

## 2019-09-11 LAB — BODY FLUID CELL COUNT WITH DIFFERENTIAL
Eos, Fluid: 0 %
Lymphs, Fluid: 37 %
Monocyte-Macrophage-Serous Fluid: 52 %
Neutrophil Count, Fluid: 11 %
Other Cells, Fluid: 0 %
Total Nucleated Cell Count, Fluid: 260 cu mm

## 2019-09-11 LAB — COMPREHENSIVE METABOLIC PANEL
ALT: 21 U/L (ref 0–44)
AST: 89 U/L — ABNORMAL HIGH (ref 15–41)
Albumin: 1.6 g/dL — ABNORMAL LOW (ref 3.5–5.0)
Alkaline Phosphatase: 179 U/L — ABNORMAL HIGH (ref 38–126)
Anion gap: 8 (ref 5–15)
BUN: 6 mg/dL (ref 6–20)
CO2: 28 mmol/L (ref 22–32)
Calcium: 7.4 mg/dL — ABNORMAL LOW (ref 8.9–10.3)
Chloride: 91 mmol/L — ABNORMAL LOW (ref 98–111)
Creatinine, Ser: <0.3 mg/dL — ABNORMAL LOW (ref 0.44–1.00)
Glucose, Bld: 127 mg/dL — ABNORMAL HIGH (ref 70–99)
Potassium: 2.6 mmol/L — CL (ref 3.5–5.1)
Sodium: 127 mmol/L — ABNORMAL LOW (ref 135–145)
Total Bilirubin: 21.7 mg/dL (ref 0.3–1.2)
Total Protein: 5.4 g/dL — ABNORMAL LOW (ref 6.5–8.1)

## 2019-09-11 LAB — POTASSIUM: Potassium: 2.7 mmol/L — CL (ref 3.5–5.1)

## 2019-09-11 LAB — PROTIME-INR
INR: 2.5 — ABNORMAL HIGH (ref 0.8–1.2)
Prothrombin Time: 26.7 seconds — ABNORMAL HIGH (ref 11.4–15.2)

## 2019-09-11 LAB — CBC WITH DIFFERENTIAL/PLATELET
Abs Immature Granulocytes: 0.8 10*3/uL — ABNORMAL HIGH (ref 0.00–0.07)
Basophils Absolute: 0.1 10*3/uL (ref 0.0–0.1)
Basophils Relative: 0 %
Eosinophils Absolute: 0.2 10*3/uL (ref 0.0–0.5)
Eosinophils Relative: 1 %
HCT: 26.9 % — ABNORMAL LOW (ref 36.0–46.0)
Hemoglobin: 9.4 g/dL — ABNORMAL LOW (ref 12.0–15.0)
Immature Granulocytes: 5 %
Lymphocytes Relative: 7 %
Lymphs Abs: 1.2 10*3/uL (ref 0.7–4.0)
MCH: 35.9 pg — ABNORMAL HIGH (ref 26.0–34.0)
MCHC: 34.9 g/dL (ref 30.0–36.0)
MCV: 102.7 fL — ABNORMAL HIGH (ref 80.0–100.0)
Monocytes Absolute: 1.4 10*3/uL — ABNORMAL HIGH (ref 0.1–1.0)
Monocytes Relative: 8 %
Neutro Abs: 13.6 10*3/uL — ABNORMAL HIGH (ref 1.7–7.7)
Neutrophils Relative %: 79 %
Platelets: 192 10*3/uL (ref 150–400)
RBC: 2.62 MIL/uL — ABNORMAL LOW (ref 3.87–5.11)
RDW: 18.2 % — ABNORMAL HIGH (ref 11.5–15.5)
WBC: 17.2 10*3/uL — ABNORMAL HIGH (ref 4.0–10.5)
nRBC: 0.1 % (ref 0.0–0.2)

## 2019-09-11 LAB — LACTATE DEHYDROGENASE, PLEURAL OR PERITONEAL FLUID: LD, Fluid: 33 U/L — ABNORMAL HIGH (ref 3–23)

## 2019-09-11 LAB — GLUCOSE, CAPILLARY: Glucose-Capillary: 196 mg/dL — ABNORMAL HIGH (ref 70–99)

## 2019-09-11 LAB — PROTEIN, PLEURAL OR PERITONEAL FLUID: Total protein, fluid: <3 g/dL

## 2019-09-11 LAB — MRSA PCR SCREENING: MRSA by PCR: NEGATIVE

## 2019-09-11 LAB — SARS CORONAVIRUS 2 (TAT 6-24 HRS): SARS Coronavirus 2: NEGATIVE

## 2019-09-11 LAB — AMYLASE, PLEURAL OR PERITONEAL FLUID: Amylase, Fluid: 41 U/L

## 2019-09-11 LAB — PREGNANCY, URINE: Preg Test, Ur: NEGATIVE

## 2019-09-11 LAB — GLUCOSE, PLEURAL OR PERITONEAL FLUID: Glucose, Fluid: 160 mg/dL

## 2019-09-11 LAB — AMMONIA: Ammonia: 42 umol/L — ABNORMAL HIGH (ref 9–35)

## 2019-09-11 MED ORDER — ONDANSETRON HCL 4 MG PO TABS: 4.0000 mg | ORAL_TABLET | Freq: Four times a day (QID) | ORAL | Status: DC | PRN

## 2019-09-11 MED ORDER — ONDANSETRON HCL 4 MG/2ML IJ SOLN: 4.0000 mg | Freq: Four times a day (QID) | INTRAMUSCULAR | Status: DC | PRN

## 2019-09-11 MED ORDER — CHLORHEXIDINE GLUCONATE CLOTH 2 % EX PADS
6.0000 | MEDICATED_PAD | Freq: Every day | CUTANEOUS | Status: DC
  Administered 2019-09-11: 6 via TOPICAL

## 2019-09-11 MED ORDER — POTASSIUM CHLORIDE IN NACL 40-0.9 MEQ/L-% IV SOLN: INTRAVENOUS | Status: DC

## 2019-09-11 MED ORDER — ALPRAZOLAM 0.25 MG PO TABS
0.2500 mg | ORAL_TABLET | Freq: Once | ORAL | Status: AC
  Administered 2019-09-11: 0.25 mg via ORAL
  Filled 2019-09-11: qty 1

## 2019-09-11 MED ORDER — SENNOSIDES-DOCUSATE SODIUM 8.6-50 MG PO TABS: 1.0000 | ORAL_TABLET | Freq: Every evening | ORAL | Status: DC | PRN

## 2019-09-11 MED ORDER — SODIUM CHLORIDE 0.9 % IV SOLN: Freq: Once | INTRAVENOUS | Status: DC

## 2019-09-11 MED ORDER — NICOTINE 21 MG/24HR TD PT24: 21.0000 mg | MEDICATED_PATCH | Freq: Every day | TRANSDERMAL | Status: DC

## 2019-09-11 MED ORDER — POTASSIUM CHLORIDE CRYS ER 20 MEQ PO TBCR
40.0000 meq | EXTENDED_RELEASE_TABLET | Freq: Once | ORAL | Status: AC
  Administered 2019-09-11: 40 meq via ORAL
  Filled 2019-09-11: qty 2

## 2019-09-11 MED ORDER — POTASSIUM CHLORIDE CRYS ER 20 MEQ PO TBCR
40.0000 meq | EXTENDED_RELEASE_TABLET | Freq: Every day | ORAL | Status: DC
  Administered 2019-09-11: 40 meq via ORAL
  Filled 2019-09-11: qty 2

## 2019-09-11 NOTE — ED Notes (Signed)
Resumed care from [facility].  Pt alert and watching tv.  Iv med infusing.

## 2019-09-11 NOTE — ED Notes (Signed)
Hospitalist to bedside.

## 2019-09-11 NOTE — ED Notes (Addendum)
[facility] Transfer center provided update per this RN.

## 2019-09-11 NOTE — ED Notes (Signed)
EDP to bedside; updated plan of care provided. 

## 2019-09-11 NOTE — Discharge Summary (Signed)
[facility] [place] [facility]   PATIENT NAME: Lindsey SeveranceKelly Huang    MR#:  [id number]  DATE OF BIRTH:  10-24-1981  DATE OF ADMISSION:  09/10/2019 ADMITTING PHYSICIAN: Ihor AustinPavan Primrose Oler, MD  DATE OF DISCHARGE: 09/11/2019  PRIMARY CARE PHYSICIAN: Patient, No Pcp Per   ADMISSION DIAGNOSIS:  Acute liver failure without hepatic coma [K72.00]  DISCHARGE DIAGNOSIS:  Active Problems:   Liver failure (HCC) Acute hypokalemia Tobacco abuse Ascites SECONDARY DIAGNOSIS:   Past Medical History:  Diagnosis Date  . Blood transfusion without reported diagnosis 2010   misscarriage  . Hx of incompetent cervix, currently pregnant   . Medical history non-contributory      ADMITTING HISTORY 38 y.o. female with a known history of incompetent cervix presented to the emergency room initially on 09/10/2019 for evaluation since seen and abdominal discomfort and nausea.  Patient was worked up and found to have jaundice bilirubin greater than 20.  She has history of heavy alcohol abuse in the past.  Recently she had some fever and myalgias she took Tylenol and TheraFlu.  She is started on N-acetylcysteine drip in the emergency room.  She also has ascites and abdominal distention paracentesis was done SBP was ruled out.  Case was [facility] with [facility] patient has been accepted but no beds are available currently.  Hospitalist service has been consulted here.  Has a loose discoloration of the skin conjunctiva.  Has abdominal distention secondary to ascites.  No fever.  COVID-19 test negative.  HOSPITAL COURSE:  Patient admitted to stepdown unit.  In the ER patient had paracentesis to evaluate for SBP.  She was started on N-acetylcysteine drip.  Potassium was aggressively being replaced.  ER physician discussed with hepat[facility] for further care for liver failure patient might need liver transplant.  [facility] been [facility].   COVID-19 test negative.  Patient will be transferred once bed is available  CONSULTS OBTAINED:  Intensivist consult  DRUG ALLERGIES:  No Known Allergies  DISCHARGE MEDICATIONS:   Allergies as of 09/11/2019   No Known Allergies     Medication List    STOP taking these medications   amoxicillin-clavulanate 875-125 MG tablet Commonly known as: Augmentin   metoCLOPramide 10 MG tablet Commonly known as: REGLAN   nabumetone 750 MG tablet Commonly known as: RELAFEN   ondansetron 4 MG disintegrating tablet Commonly known as: Zofran ODT   silver sulfADIAZINE 1 % cream Commonly known as: SILVADENE       Today  Patient seen and evaluated today yellowish discoloration of skin Jaundiced VITAL SIGNS:  Blood pressure (!) 97/44, pulse (!) 104, temperature 98.6 F (37 C), temperature source Oral, resp. rate 15, height 4\' 11"  (1.499 m), weight 80.1 kg, last menstrual period 09/07/2018, SpO2 100 %, unknown if currently breastfeeding.  I/O:    Intake/Output Summary (Last 24 hours) at 09/11/2019 1750 Last data filed at 09/11/2019 0929 Gross per 24 hour  Intake 1480 ml  Output -  Net 1480 ml    PHYSICAL EXAMINATION:  Physical Exam  GENERAL:  38 y.o.-year-old patient lying in the bed with no acute distress.  LUNGS: Normal breath sounds bilaterally, no wheezing, rales,rhonchi or crepitation. No use of accessory muscles of respiration.  CARDIOVASCULAR: S1, S2 normal. No murmurs, rubs, or gallops.  ABDOMEN: Soft, non-tender, distended. Bowel sounds present. No organomegaly or mass.  NEUROLOGIC: Moves all 4 extremities. PSYCHIATRIC: The patient is alert and oriented x 3.  SKIN: Yellowish discoloration of skin.   DATA REVIEW:   CBC Recent Labs  Lab 09/11/19 0458  WBC 17.2*  HGB 9.4*  HCT 26.9*  PLT 192    Chemistries  Recent Labs  Lab 09/11/19 0458  NA 127*  K 2.6*  CL 91*  CO2 28  GLUCOSE 127*  BUN 6  CREATININE <0.30*  CALCIUM 7.4*  AST 89*  ALT 21   ALKPHOS 179*  BILITOT 21.7*    Cardiac Enzymes No results for input(s): TROPONINI in the last 168 hours.  Microbiology Results  Results for orders placed or performed during the hospital encounter of 09/10/19  SARS CORONAVIRUS 2 (TAT 6-24 HRS) Nasopharyngeal Nasopharyngeal Swab     Status: None   Collection Time: 09/10/19  5:14 PM   Specimen: Nasopharyngeal Swab  Result Value Ref Range Status   SARS Coronavirus 2 NEGATIVE NEGATIVE Final    Comment: (NOTE) SARS-CoV-2 target nucleic acids are NOT DETECTED. The SARS-CoV-2 RNA is generally detectable in upper and lower respiratory specimens during the acute phase of infection. Negative results do not preclude SARS-CoV-2 infection, do not rule out co-infections with other pathogens, and should not be used as the sole basis for treatment or other patient management decisions. Negative results must be combined with clinical observations, patient history, and epidemiological information. The expected result is Negative. Fact Sheet for Patients: HairSlick.no Fact Sheet for Healthcare Providers: quierodirigir.com This test is not yet approved or cleared by the [place] FDA and  has been authorized for detection and/or diagnosis of SARS-CoV-2 by FDA under an Emergency Use Authorization (EUA). This EUA will remain  in effect (meaning this test can be used) for the duration of the COVID-19 declaration under Section 56 4(b)(1) of the Act, 21 U.S.C. section 360bbb-3(b)(1), unless the authorization is terminated or revoked sooner. Performed at [facility], [address] [place], [place] [address]   Blood culture (routine x 2)     Status: None (Preliminary result)   Collection Time: 09/10/19  7:28 PM   Specimen: BLOOD  Result Value Ref Range Status   Specimen Description BLOOD LEFT ANTECUBITAL  Final   Special Requests   Final    BOTTLES DRAWN AEROBIC AND ANAEROBIC Blood  Culture adequate volume   Culture   Final    NO GROWTH < 12 HOURS Performed at [facility], [address] [place], [place] [address]    Report Status PENDING  Incomplete  Blood culture (routine x 2)     Status: None (Preliminary result)   Collection Time: 09/10/19  7:28 PM   Specimen: BLOOD  Result Value Ref Range Status   Specimen Description BLOOD RIGHT ANTECUBITAL  Final   Special Requests   Final    BOTTLES DRAWN AEROBIC AND ANAEROBIC Blood Culture adequate volume   Culture   Final    NO GROWTH < 12 HOURS Performed at [facility], [address], [place], [place] [address]    Report Status PENDING  Incomplete    RADIOLOGY:  Dg Chest 2 View  Result Date: 09/10/2019 CLINICAL DATA:  Chest pain and shortness of breath for approximately 1 week. EXAM: CHEST - 2 VIEW COMPARISON:  None. FINDINGS: The heart size and mediastinal contours are within normal limits. Both lungs are clear. The visualized skeletal structures are unremarkable. IMPRESSION: No active cardiopulmonary disease. Electronically Signed   By: Danae Orleans M.D.   On: 09/10/2019 13:56   US Abdomen Limited Ruq  Result Date: 09/10/2019  CLINICAL DATA:  Abdominal swelling, epigastric pain, liver failure EXAM: ULTRASOUND ABDOMEN LIMITED RIGHT UPPER QUADRANT COMPARISON:  Abdominal ultrasound 01/31/2015 FINDINGS: Gallbladder: The gallbladder wall is mildly thickened measuring 0.4 cm. Small gallstones measuring up to 0.9 cm. Negative Murphy sign. Common bile duct: Diameter: 0.5 cm Liver: No focal lesion identified. Diffusely increased echogenicity. The liver echotexture is coarse. Portal vein is patent on color Doppler imaging with normal direction of blood flow towards the liver. Recanalized umbilical vein. Other: Mild right upper quadrant ascites. IMPRESSION: 1. Diffusely increased liver echogenicity with coarse echotexture, likely due to hepatic steatosis. 2. Mild gallbladder wall thickening which is  nonspecific and can be seen in the setting of fluid overload, hypoalbuminemia, acute inflammation, along others. Small gallstones. 3.  Mild right upper quadrant ascites. Electronically Signed   By: Audie Pinto M.D.   On: 09/10/2019 16:11    Follow up with PCP in 1 week.  Management plans discussed with the patient, family and they are in agreement.  CODE STATUS: Full code    Code Status Orders  (From admission, onward)         Start     Ordered   09/11/19 1646  Full code  Continuous     09/11/19 1645        Code Status History    This patient has a current code status but no historical code status.   Advance Care Planning Activity      TOTAL TIME TAKING CARE OF THIS PATIENT ON DAY OF DISCHARGE: more than 35 minutes.   Saundra Shelling M.D on 09/11/2019 at 5:50 PM  Between 7am to 6pm - Pager - [phone]  After 6pm go to [email or web address] - password [email or web address] [facility]  Office  [phone]  CC: Primary care physician; Patient, No Pcp Per  Note: This dictation was prepared with Dragon dictation along with smaller phrase technology. Any transcriptional errors that result from this process are unintentional.

## 2019-09-11 NOTE — ED Provider Notes (Signed)
Ascites studies showing no evidence of SBP. Patient remains stable awaiting bed at [facility], [place], MD 09/11/19 [phone]

## 2019-09-11 NOTE — ED Notes (Signed)
Pt ambulatory to toilet independently. 

## 2019-09-11 NOTE — ED Notes (Signed)
Pt provided lunch tray.

## 2019-09-11 NOTE — ED Notes (Signed)
Called [facility] transfer Center spoke with [facility], states pt is still on waitlist for bed and accepting MD will call [facility] ED this afternoon for a pt update.

## 2019-09-11 NOTE — ED Provider Notes (Signed)
Patient is awake and alert, not disoriented.  She has eaten well.  We discussed with [facility], it may be another 24 hours before a bed becomes available.  They have advised that we admit here on N-acetylcysteine until a bed opens at [facility].   Earleen Newport, MD 09/11/19 1430

## 2019-09-11 NOTE — H&P (Signed)
Name: Lindsey Huang MRN: [id number] DOB: 1981/03/01     CONSULTATION DATE: 09/10/2019  REFERRING MD :  Pyreddy  CHIEF COMPLAINT:  Liver Failure    HISTORY OF PRESENT ILLNESS:  Lindsey Huang is a 38 year old patient with a history of alcohol-induced hepatitis who presents to the ICU for acetaminophen unintentional overdose. Patient went to the ED two days ago for swelling of her abdomen and yellowing of her skin and eyes. She denied any pain, nausea, vomiting or diarrhea. She had been taking Theraflu and Tylenol for fever and myalgias. Lab work in the ED showed elevated total bilirubin levels at 25.5 with 13.0 direct and 12.5 indirect. AST was elevated at 111. INR was 2.3 and WBC elevated at 17. Potassium was 2.6. Patient reports she has low potassium. Ultrasound showed an increased in liver echogenicity with coarse echotexture. [place] also showed mild gallbladder wall thickening and mild RUQ ascites. Abdominal paracentesis was done and showed no signs of SBP. Patient was started on N-acetylcysteine drip in the ED. Patient is on the wait list to be admitted at [facility] for further care. Today in the ICU, patient reports she feels well, but is starting to feel "tight" again and she has feels swelling in her legs. Patient has a good appetite, has been having normal bowel movements and has been urinating regularly. She denies fever, chills, pain, nausea, vomiting or diarrhea. She reports her LMP was three months ago, and she has not been recently sexually active.    PAST MEDICAL HISTORY :   has a past medical history of Blood transfusion without reported diagnosis (2010), incompetent cervix, currently pregnant, and Medical history non-contributory.  has a past surgical history that includes Cervical cerclage. Prior to Admission medications   Medication Sig Start Date End Date Taking? Authorizing Provider  amoxicillin-clavulanate (AUGMENTIN) 875-125 MG tablet Take 1 tablet by mouth 2 (two) times  daily. Patient not taking: Reported on 09/10/2019 11/20/17   Kem Boroughs B, FNP  metoCLOPramide (REGLAN) 10 MG tablet Take 1 tablet (10 mg total) by mouth every 8 (eight) hours as needed for nausea or vomiting. 10/01/15 09/30/16  Sharyn Creamer, MD  nabumetone (RELAFEN) 750 MG tablet Take 1 tablet (750 mg total) by mouth 2 (two) times daily. Patient not taking: Reported on 09/10/2019 10/08/18   Menshew, Charlesetta Ivory, PA-C  ondansetron (ZOFRAN ODT) 4 MG disintegrating tablet Take 1 tablet (4 mg total) by mouth every 6 (six) hours as needed for nausea or vomiting. Patient not taking: Reported on 09/10/2019 10/01/15   Sharyn Creamer, MD  silver sulfADIAZINE (SILVADENE) 1 % cream Apply 1 application topically daily. Patient not taking: Reported on 09/10/2019 09/16/18   Harlene Salts A, PA-C   No Known Allergies  FAMILY HISTORY:  family history includes Depression in her maternal aunt. SOCIAL HISTORY:  reports that she has been smoking cigarettes. She has never used smokeless tobacco. She reports current alcohol use. She reports that she does not use drugs.  REVIEW OF SYSTEMS:   Review of Systems  Constitutional: Negative for chills, fever and malaise/fatigue.  HENT: Negative for congestion and sore throat.   Eyes: Negative for blurred vision.  Respiratory: Positive for shortness of breath (Starting to feel "pressure" again from her abdomen). Negative for cough.   Cardiovascular: Negative for chest pain and palpitations.  Gastrointestinal: Negative for abdominal pain, constipation, diarrhea, nausea and vomiting.  Genitourinary: Negative for dysuria and hematuria.  Musculoskeletal: Negative for myalgias.  Neurological: Negative for tingling, sensory change, weakness  and headaches.  Psychiatric/Behavioral: Negative.       VITAL SIGNS: Temp:  [98.9 F (37.2 C)-99.2 F (37.3 C)] 99.2 F (37.3 C) (10/20 1522) Pulse Rate:  [96-101] 99 (10/20 1522) Resp:  [17-18] 18 (10/20 1522) BP:  (96-111)/(55-73) 107/69 (10/20 1522) SpO2:  [96 %-99 %] 99 % (10/20 1522)   I/O last 3 completed shifts: In: 1000 [I.V.:1000] Out: -  Total I/O In: 480 [P.O.:480] Out: -    SpO2: 99 %   Physical Exam  Constitutional: She is oriented to person, place, and time and well-developed, well-nourished, and in no distress. She appears jaundiced.  HENT:  Head: Normocephalic and atraumatic.  Jaundice present on roof of mouth   Eyes: Pupils are equal, round, and reactive to light. Scleral icterus is present.  Neck: Neck supple.  Cardiovascular: Normal rate and regular rhythm.  Murmur heard. Pulmonary/Chest: Effort normal and breath sounds normal.  Abdominal: Bowel sounds are normal. She exhibits distension. There is no abdominal tenderness. There is no rebound.  Musculoskeletal:        General: Edema (Mild edema present in lower extremities bilaterally) present.  Neurological: She is alert and oriented to person, place, and time. She exhibits normal muscle tone.  Skin: Skin is warm, dry and intact.  Jaundice present diffusely      I personally reviewed lab work that was obtained in last 24 hrs.   MEDICATIONS: I have reviewed all medications and confirmed regimen as documented   CULTURE RESULTS   Recent Results (from the past 240 hour(s))  SARS CORONAVIRUS 2 (TAT 6-24 HRS) Nasopharyngeal Nasopharyngeal Swab     Status: None   Collection Time: 09/10/19  5:14 PM   Specimen: Nasopharyngeal Swab  Result Value Ref Range Status   SARS Coronavirus 2 NEGATIVE NEGATIVE Final    Comment: (NOTE) SARS-CoV-2 target nucleic acids are NOT DETECTED. The SARS-CoV-2 RNA is generally detectable in upper and lower respiratory specimens during the acute phase of infection. Negative results do not preclude SARS-CoV-2 infection, do not rule out co-infections with other pathogens, and should not be used as the sole basis for treatment or other patient management decisions. Negative results must be  combined with clinical observations, patient history, and epidemiological information. The expected result is Negative. Fact Sheet for Patients: HairSlick.nohttps://www.fda.gov/media/138098/download Fact Sheet for Healthcare Providers: quierodirigir.comhttps://www.fda.gov/media/138095/download This test is not yet approved or cleared by the [place] FDA and  has been authorized for detection and/or diagnosis of SARS-CoV-2 by FDA under an Emergency Use Authorization (EUA). This EUA will remain  in effect (meaning this test can be used) for the duration of the COVID-19 declaration under Section 56 4(b)(1) of the Act, 21 U.S.C. section 360bbb-3(b)(1), unless the authorization is terminated or revoked sooner. Performed at [facility], [address] [place], [place] [address]   Blood culture (routine x 2)     Status: None (Preliminary result)   Collection Time: 09/10/19  7:28 PM   Specimen: BLOOD  Result Value Ref Range Status   Specimen Description BLOOD LEFT ANTECUBITAL  Final   Special Requests   Final    BOTTLES DRAWN AEROBIC AND ANAEROBIC Blood Culture adequate volume   Culture   Final    NO GROWTH < 12 HOURS Performed at [facility], [address] [place], [place] [address]    Report Status PENDING  Incomplete  Blood culture (routine x 2)     Status: None (Preliminary result)   Collection Time: 09/10/19  7:28 PM  Specimen: BLOOD  Result Value Ref Range Status   Specimen Description BLOOD RIGHT ANTECUBITAL  Final   Special Requests   Final    BOTTLES DRAWN AEROBIC AND ANAEROBIC Blood Culture adequate volume   Culture   Final    NO GROWTH < 12 HOURS Performed at [facility], [address], [place], [place] [address]    Report Status PENDING  Incomplete          IMAGING    US Abdomen Limited Ruq  Result Date: 09/10/2019 CLINICAL DATA:  Abdominal swelling, epigastric pain, liver failure EXAM: ULTRASOUND ABDOMEN LIMITED RIGHT UPPER QUADRANT COMPARISON:   Abdominal ultrasound 01/31/2015 FINDINGS: Gallbladder: The gallbladder wall is mildly thickened measuring 0.4 cm. Small gallstones measuring up to 0.9 cm. Negative Murphy sign. Common bile duct: Diameter: 0.5 cm Liver: No focal lesion identified. Diffusely increased echogenicity. The liver echotexture is coarse. Portal vein is patent on color Doppler imaging with normal direction of blood flow towards the liver. Recanalized umbilical vein. Other: Mild right upper quadrant ascites. IMPRESSION: 1. Diffusely increased liver echogenicity with coarse echotexture, likely due to hepatic steatosis. 2. Mild gallbladder wall thickening which is nonspecific and can be seen in the setting of fluid overload, hypoalbuminemia, acute inflammation, along others. Small gallstones. 3.  Mild right upper quadrant ascites. Electronically Signed   By: Audie Pinto M.D.   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 Waiting for availability at [facility] to transfer patient  [facility] has accepted patient and will be leaving tonight   Corrin Parker, M.D.  [facility]  Medical Director [place] Director [facility] Cardio-Pulmonary Department

## 2019-09-11 NOTE — ED Notes (Signed)
ED TO INPATIENT HANDOFF REPORT  ED Nurse Name and Phone #:Anias Bartol  S Name/Age/Gender Lindsey Huang 38 y.o. female Room/Bed: ED08A/ED08A  Code Status   Code Status: Not on file  Home/SNF/Other Home Patient oriented to: self, place, time and situation Is this baseline? Yes   Triage Complete: Triage complete  Chief Complaint abd swelling edema rash fatigue  Triage Note 10/14 patient started feeling lethargic, rash on chest, and constipated. 10/15 had severe abdominal swelling. 10/18 started having jaundice all over and sob due to severe swelling. Patient has pain right in center of sternum   Allergies No Known Allergies  Level of Care/Admitting Diagnosis ED Disposition    ED Disposition Condition Comment   Admit  Hospital Area: [facility] [[id number]]  Level of Care: Stepdown [14]  Covid Evaluation: Confirmed COVID Negative  Diagnosis: Liver failure [facility](HCC) [[id number]]  Admitting Physician: Ihor AustinPYREDDY, PAVAN [[id number]]  Attending Physician: Ihor AustinPYREDDY, PAVAN [[id number]]  Estimated length of stay: past midnight tomorrow  Certification:: I certify this patient will need inpatient services for at least 2 midnights  PT Class (Do Not Modify): Inpatient [101]  PT Acc Code (Do Not Modify): Private [1]       B Medical/Surgery History Past Medical History:  Diagnosis Date  . Blood transfusion without reported diagnosis 2010   misscarriage  . Hx of incompetent cervix, currently pregnant   . Medical history non-contributory    Past Surgical History:  Procedure Laterality Date  . CERVICAL CERCLAGE     x4     A IV Location/Drains/Wounds Patient Lines/Drains/Airways Status   Active Line/Drains/Airways    Name:   Placement date:   Placement time:   Site:   Days:   Peripheral IV 09/10/19 Left Antecubital   09/10/19    1327    Antecubital   1   Peripheral IV 09/10/19 Right Antecubital   09/10/19    1819    Antecubital   1          Intake/Output Last 24  hours  Intake/Output Summary (Last 24 hours) at 09/11/2019 1537 Last data filed at 09/11/2019 0929 Gross per 24 hour  Intake 1480 ml  Output -  Net 1480 ml    Labs/Imaging Results for orders placed or performed during the hospital encounter of 09/10/19 (from the past 48 hour(s))  Basic metabolic panel     Status: Abnormal   Collection Time: 09/10/19  1:20 PM  Result Value Ref Range   Sodium 129 (L) 135 - 145 mmol/L   Potassium 2.8 (L) 3.5 - 5.1 mmol/L   Chloride 90 (L) 98 - 111 mmol/L   CO2 25 22 - 32 mmol/L   Glucose, Bld 108 (H) 70 - 99 mg/dL   BUN 5 (L) 6 - 20 mg/dL   Creatinine, Ser UNABLE T O REPORT DUE TO ICTERUS 0.44 - 1.00 mg/dL   Calcium 7.9 (L) 8.9 - 10.3 mg/dL   GFR calc non Af Amer NOT CALCULATED >60 mL/min   GFR calc Af Amer NOT CALCULATED >60 mL/min   Anion gap 14 5 - 15    Comment: Performed at [facility], [address] [place], [place] [id number]  CBC     Status: Abnormal   Collection Time: 09/10/19  1:20 PM  Result Value Ref Range   WBC 23.3 (H) 4.0 - 10.5 K/uL   RBC 3.06 (L) 3.87 - 5.11 MIL/uL   Hemoglobin 11.0 (L) 12.0 - 15.0 g/dL   HCT 62.131.8 (L) 30.836.0 - 65.746.0 %  MCV 103.9 (H) 80.0 - 100.0 fL   MCH 35.9 (H) 26.0 - 34.0 pg   MCHC 34.6 30.0 - 36.0 g/dL   RDW 45.4 (H) 09.8 - 11.9 %   Platelets 212 150 - 400 K/uL   nRBC 0.0 0.0 - 0.2 %    Comment: Performed at [facility], [address], [place], [place] [address]  Troponin I (High Sensitivity)     Status: None   Collection Time: 09/10/19  1:20 PM  Result Value Ref Range   Troponin I (High Sensitivity) 8 <18 ng/L    Comment: (NOTE) Elevated high sensitivity troponin I (hsTnI) values and significant  changes across serial measurements may suggest ACS but many other  chronic and acute conditions are known to elevate hsTnI results.  Refer to the Links section for chest pain algorithms and additional  guidance. Performed at [facility], [address]  [place], [place] [address]   Hepatic function panel     Status: Abnormal   Collection Time: 09/10/19  1:20 PM  Result Value Ref Range   Total Protein 6.3 (L) 6.5 - 8.1 g/dL   Albumin 2.0 (L) 3.5 - 5.0 g/dL   AST 308 (H) 15 - 41 U/L   ALT 23 0 - 44 U/L    Comment: ICTERUS AT THIS LEVEL MAY AFFECT RESULT   Alkaline Phosphatase 212 (H) 38 - 126 U/L   Total Bilirubin 25.5 (HH) 0.3 - 1.2 mg/dL    Comment: CRITICAL RESULT CALLED TO, READ BACK BY AND VERIFIED WITH JESSICA  AUGUAS  09/10/2019 1445 KMP/DAS    Bilirubin, Direct 13.0 (H) 0.0 - 0.2 mg/dL    Comment: RESULT CONFIRMED BY MANUAL DILUTION KMP/DAS   Indirect Bilirubin 12.5 (H) 0.3 - 0.9 mg/dL    Comment: Performed at [facility], [address] [place], [place] [address]  Lipase, blood     Status: Abnormal   Collection Time: 09/10/19  1:20 PM  Result Value Ref Range   Lipase 55 (H) 11 - 51 U/L    Comment: Performed at [facility], [address] [place], [place] [address]  Troponin I (High Sensitivity)     Status: None   Collection Time: 09/10/19  3:18 PM  Result Value Ref Range   Troponin I (High Sensitivity) 7 <18 ng/L    Comment: (NOTE) Elevated high sensitivity troponin I (hsTnI) values and significant  changes across serial measurements may suggest ACS but many other  chronic and acute conditions are known to elevate hsTnI results.  Refer to the "Links" section for chest pain algorithms and additional  guidance. Performed at [facility], [address] [place], [place] [address]   Acetaminophen level     Status: Abnormal   Collection Time: 09/10/19  3:18 PM  Result Value Ref Range   Acetaminophen (Tylenol), Serum <10 (L) 10 - 30 ug/mL    Comment: (NOTE) Therapeutic concentrations vary significantly. A range of 10-30 ug/mL  may be an effective concentration for many patients. However, some  are best treated at concentrations outside of this range. Acetaminophen concentrations >150 ug/mL  at 4 hours after ingestion  and >50 ug/mL at 12 hours after ingestion are often associated with  toxic reactions. Performed at [facility], [address] [place], [place] [address]   Protime-INR     Status: Abnormal   Collection Time: 09/10/19  3:18 PM  Result Value Ref Range   Prothrombin Time 24.6 (H) 11.4 - 15.2  seconds   INR 2.3 (H) 0.8 - 1.2    Comment: (NOTE) INR goal varies based on device and disease states. Performed at [facility], [address] [place], [place] [address]   Hepatitis panel, acute     Status: None   Collection Time: 09/10/19  3:18 PM  Result Value Ref Range   Hepatitis B Surface Ag NON REACTIVE NON REACTIVE   HCV Ab NON REACTIVE NON REACTIVE    Comment: (NOTE) Nonreactive HCV antibody screen is consistent with no HCV infections,  unless recent infection is suspected or other evidence exists to indicate HCV infection.    Hep A IgM NON REACTIVE NON REACTIVE   Hep B C IgM NON REACTIVE NON REACTIVE    Comment: Performed at [facility], [address], [place], [place] [address]  Salicylate level     Status: None   Collection Time: 09/10/19  3:18 PM  Result Value Ref Range   Salicylate Lvl UNABLE TO REPORT DUE TO ICTERUS 2.8 - 30.0 mg/dL    Comment: Performed at [facility], [address] [place] [place], [place] [address]  Ethanol     Status: None   Collection Time: 09/10/19  3:24 PM  Result Value Ref Range   Alcohol, Ethyl (B) <10 <10 mg/dL    Comment: (NOTE) Lowest detectable limit for serum alcohol is 10 mg/dL. For medical purposes only. Performed at [facility], [address] [place], [place] [address]   SARS CORONAVIRUS 2 (TAT 6-24 HRS) Nasopharyngeal Nasopharyngeal Swab     Status: None   Collection Time: 09/10/19  5:14 PM   Specimen: Nasopharyngeal Swab  Result Value Ref Range   SARS Coronavirus 2 NEGATIVE NEGATIVE    Comment: (NOTE) SARS-CoV-2 target nucleic acids are NOT DETECTED. The  SARS-CoV-2 RNA is generally detectable in upper and lower respiratory specimens during the acute phase of infection. Negative results do not preclude SARS-CoV-2 infection, do not rule out co-infections with other pathogens, and should not be used as the sole basis for treatment or other patient management decisions. Negative results must be combined with clinical observations, patient history, and epidemiological information. The expected result is Negative. Fact Sheet for Patients: HairSlick.no Fact Sheet for Healthcare Providers: quierodirigir.com This test is not yet approved or cleared by the [place] FDA and  has been authorized for detection and/or diagnosis of SARS-CoV-2 by FDA under an Emergency Use Authorization (EUA). This EUA will remain  in effect (meaning this test can be used) for the duration of the COVID-19 declaration under Section 56 4(b)(1) of the Act, 21 U.S.C. section 360bbb-3(b)(1), unless the authorization is terminated or revoked sooner. Performed at [facility], [address], [place], [place] [address]   Urine Drug Screen, Qualitative ([facility] only)     Status: None   Collection Time: 09/10/19  7:28 PM  Result Value Ref Range   Tricyclic, Ur Screen NONE DETECTED NONE DETECTED   Amphetamines, Ur Screen NONE DETECTED NONE DETECTED   MDMA (Ecstasy)Ur Screen NONE DETECTED NONE DETECTED   Cocaine Metabolite,Ur [place] NONE DETECTED NONE DETECTED   Opiate, Ur Screen NONE DETECTED NONE DETECTED   Phencyclidine (PCP) Ur S NONE DETECTED NONE DETECTED   Cannabinoid 50 Ng, Ur [place] NONE DETECTED NONE DETECTED   Barbiturates, Ur Screen NONE DETECTED NONE DETECTED   Benzodiazepine, Ur Scrn NONE DETECTED NONE DETECTED   Methadone Scn, Ur NONE DETECTED NONE DETECTED    Comment: (NOTE) Tricyclics + metabolites, urine  Cutoff 1000 ng/mL Amphetamines + metabolites, urine  Cutoff 1000 ng/mL MDMA (Ecstasy), urine               Cutoff 500 ng/mL Cocaine Metabolite, urine          Cutoff 300 ng/mL Opiate + metabolites, urine        Cutoff 300 ng/mL Phencyclidine (PCP), urine         Cutoff 25 ng/mL Cannabinoid, urine                 Cutoff 50 ng/mL Barbiturates + metabolites, urine  Cutoff 200 ng/mL Benzodiazepine, urine              Cutoff 200 ng/mL Methadone, urine                   Cutoff 300 ng/mL The urine drug screen provides only a preliminary, unconfirmed analytical test result and should not be used for non-medical purposes. Clinical consideration and professional judgment should be applied to any positive drug screen result due to possible interfering substances. A more specific alternate chemical method must be used in order to obtain a confirmed analytical result. Gas chromatography / mass spectrometry (GC/MS) is the preferred confirmat ory method. Performed at [facility], [address] [place], [place] [address]   Urinalysis, Complete w Microscopic     Status: Abnormal   Collection Time: 09/10/19  7:28 PM  Result Value Ref Range   Color, Urine ORANGE (A) YELLOW   APPearance HAZY (A) CLEAR   Specific Gravity, Urine 1.012 1.005 - 1.030   pH  5.0 - 8.0    TEST NOT REPORTED DUE TO COLOR INTERFERENCE OF URINE PIGMENT   Glucose, UA (A) NEGATIVE mg/dL    TEST NOT REPORTED DUE TO COLOR INTERFERENCE OF URINE PIGMENT   Hgb urine dipstick (A) NEGATIVE    TEST NOT REPORTED DUE TO COLOR INTERFERENCE OF URINE PIGMENT   Bilirubin Urine (A) NEGATIVE    TEST NOT REPORTED DUE TO COLOR INTERFERENCE OF URINE PIGMENT   Ketones, ur (A) NEGATIVE mg/dL    TEST NOT REPORTED DUE TO COLOR INTERFERENCE OF URINE PIGMENT   Protein, ur (A) NEGATIVE mg/dL    TEST NOT REPORTED DUE TO COLOR INTERFERENCE OF URINE PIGMENT   Nitrite (A) NEGATIVE    TEST NOT REPORTED DUE TO COLOR INTERFERENCE OF URINE PIGMENT   Leukocytes,Ua (A) NEGATIVE    TEST NOT REPORTED DUE TO COLOR INTERFERENCE OF URINE PIGMENT    RBC / HPF 0-5 0 - 5 RBC/hpf   WBC, UA 6-10 0 - 5 WBC/hpf   Bacteria, UA MANY (A) NONE SEEN   Squamous Epithelial / LPF 0-5 0 - 5   Mucus PRESENT     Comment: Performed at [facility], [address] [place], [place] [address]  Blood culture (routine x 2)     Status: None (Preliminary result)   Collection Time: 09/10/19  7:28 PM   Specimen: BLOOD  Result Value Ref Range   Specimen Description BLOOD LEFT ANTECUBITAL    Special Requests      BOTTLES DRAWN AEROBIC AND ANAEROBIC Blood Culture adequate volume   Culture      NO GROWTH < 12 HOURS Performed at [facility], [address] [place], [place] [address]    Report Status PENDING   Blood culture (routine x 2)     Status: None (Preliminary result)   Collection Time: 09/10/19  7:28 PM   Specimen: BLOOD  Result Value Ref Range   Specimen Description BLOOD RIGHT ANTECUBITAL    Special Requests      BOTTLES DRAWN AEROBIC AND ANAEROBIC Blood Culture adequate volume   Culture      NO GROWTH < 12 HOURS Performed at [facility], [address] [place], [place] [address]    Report Status PENDING   Creatinine, serum     Status: None   Collection Time: 09/10/19  8:44 PM  Result Value Ref Range   Creatinine, Ser UNABLE TO REPORT DUE TO ICTERUS 0.44 - 1.00 mg/dL   GFR calc non Af Amer NOT CALCULATED >60 mL/min   GFR calc Af Amer NOT CALCULATED >60 mL/min    Comment: Performed at [facility], [address] [place], [place] [address]  Body fluid cell count with differential     Status: Abnormal   Collection Time: 09/10/19 11:55 PM  Result Value Ref Range   Fluid Type-FCT PERITONEAL CAVITY     Comment: CORRECTED ON 10/20 AT 0005: PREVIOUSLY REPORTED AS Peritoneal   Color, Fluid YELLOW YELLOW    Comment: CORRECTED ON 10/20 AT 0154: PREVIOUSLY REPORTED AS COLORLESS   Appearance, Fluid CLEAR (A) CLEAR    Comment: CORRECTED ON 10/20 AT 0154: PREVIOUSLY REPORTED AS HAZY   Total Nucleated  Cell Count, Fluid 260 cu mm    Comment: CORRECTED ON 10/20 AT 0154: PREVIOUSLY REPORTED AS 368   Neutrophil Count, Fluid 11 %    Comment: CORRECTED ON 10/20 AT 0154: PREVIOUSLY REPORTED AS 2   Lymphs, Fluid 37 %    Comment: CORRECTED ON 10/20 AT 0154: PREVIOUSLY REPORTED AS 54   Monocyte-Macrophage-Serous Fluid 52 %    Comment: CORRECTED ON 10/20 AT 0154: PREVIOUSLY REPORTED AS 44   Eos, Fluid 0 %   Other Cells, Fluid 0 %    Comment: Performed at [facility], [address] [place], [place] [address]  Glucose, pleural or peritoneal fluid     Status: None   Collection Time: 09/10/19 11:55 PM  Result Value Ref Range   Glucose, Fluid 160 mg/dL    Comment: (NOTE) No normal range established for this test Results should be evaluated in conjunction with serum values    Fluid Type-FGLU PERITONEAL CAVITY     Comment: Performed at [facility], [address] [place], [place] [address]  Protein, pleural or peritoneal fluid     Status: None   Collection Time: 09/10/19 11:55 PM  Result Value Ref Range   Total protein, fluid <3.0 g/dL    Comment: (NOTE) No normal range established for this test Results should be evaluated in conjunction with serum values    Fluid Type-FTP PERITONEAL CAVITY     Comment: Performed at [facility], [address] [place], [place] [address]  Lactate dehydrogenase (pleural or peritoneal fluid)     Status: Abnormal   Collection Time: 09/10/19 11:55 PM  Result Value Ref Range   LD, Fluid 33 (H) 3 - 23 U/L    Comment: (NOTE) Results should be evaluated in conjunction with serum values    Fluid Type-FLDH PERITONEAL CAVITY     Comment: Performed at [facility], [address] [place], [place] [address]  Amylase, pleural or peritoneal fluid     Status: None   Collection Time: 09/10/19 11:55 PM  Result Value Ref Range   Amylase, Fluid 41 U/L    Comment: NO NORMAL RANGE ESTABLISHED FOR THIS TEST   Fluid Type-FAMY  PERITONEAL CAVITY     Comment: Performed at [facility], [address] [place], [place] [address]  CBC with Differential     Status: Abnormal   Collection Time: 09/11/19  4:58 AM  Result Value Ref Range   WBC 17.2 (H) 4.0 - 10.5 K/uL   RBC 2.62 (L) 3.87 - 5.11 MIL/uL   Hemoglobin 9.4 (L) 12.0 - 15.0 g/dL   HCT 26.9 (L) 36.0 - 46.0 %   MCV 102.7 (H) 80.0 - 100.0 fL   MCH 35.9 (H) 26.0 - 34.0 pg   MCHC 34.9 30.0 - 36.0 g/dL   RDW 18.2 (H) 11.5 - 15.5 %   Platelets 192 150 - 400 K/uL   nRBC 0.1 0.0 - 0.2 %   Neutrophils Relative % 79 %   Neutro Abs 13.6 (H) 1.7 - 7.7 K/uL   Lymphocytes Relative 7 %   Lymphs Abs 1.2 0.7 - 4.0 K/uL   Monocytes Relative 8 %   Monocytes Absolute 1.4 (H) 0.1 - 1.0 K/uL   Eosinophils Relative 1 %   Eosinophils Absolute 0.2 0.0 - 0.5 K/uL   Basophils Relative 0 %   Basophils Absolute 0.1 0.0 - 0.1 K/uL   Immature Granulocytes 5 %   Abs Immature Granulocytes 0.80 (H) 0.00 - 0.07 K/uL    Comment: Performed at [facility], [address] [place], [place] [address]  Comprehensive metabolic panel     Status: Abnormal   Collection Time: 09/11/19  4:58 AM  Result Value Ref Range   Sodium 127 (L) 135 - 145 mmol/L   Potassium 2.6 (LL) 3.5 - 5.1 mmol/L    Comment: CRITICAL RESULT CALLED TO, READ BACK BY AND VERIFIED WITH  VANESSA ASHLEY AT 0981 09/11/2019 SDR    Chloride 91 (L) 98 - 111 mmol/L   CO2 28 22 - 32 mmol/L   Glucose, Bld 127 (H) 70 - 99 mg/dL   BUN 6 6 - 20 mg/dL   Creatinine, Ser <0.30 (L) 0.44 - 1.00 mg/dL    Comment: ICTERUS AT THIS LEVEL MAY AFFECT RESULT   Calcium 7.4 (L) 8.9 - 10.3 mg/dL   Total Protein 5.4 (L) 6.5 - 8.1 g/dL   Albumin 1.6 (L) 3.5 - 5.0 g/dL   AST 89 (H) 15 - 41 U/L   ALT 21 0 - 44 U/L    Comment: ICTERUS AT THIS LEVEL MAY AFFECT RESULT   Alkaline Phosphatase 179 (H) 38 - 126 U/L   Total Bilirubin 21.7 (HH) 0.3 - 1.2 mg/dL    Comment: CRITICAL RESULT CALLED TO, READ BACK BY AND VERIFIED WITH   VANESSA  ASHLEY AT 0549 09/11/2019 SDR    GFR calc non Af Amer NOT CALCULATED >60 mL/min   GFR calc Af Amer NOT CALCULATED >60 mL/min   Anion gap 8 5 - 15    Comment: Performed at [facility], [address] [place], [place] [address]  Protime-INR     Status: Abnormal   Collection Time: 09/11/19  4:58 AM  Result Value Ref Range   Prothrombin Time 26.7 (H) 11.4 - 15.2 seconds   INR 2.5 (H) 0.8 - 1.2    Comment: (NOTE) INR goal varies based on device and disease states. Performed at [facility], [address] [place], [place] [address]   Ammonia     Status: Abnormal   Collection Time: 09/11/19  4:58 AM  Result Value Ref Range   Ammonia 42 (H) 9 - 35 umol/L  Comment: ICTERUS AT THIS LEVEL MAY AFFECT RESULT Performed at [facility], [address] [place], [place] [address]    Dg Chest 2 View  Result Date: 09/10/2019 CLINICAL DATA:  Chest pain and shortness of breath for approximately 1 week. EXAM: CHEST - 2 VIEW COMPARISON:  None. FINDINGS: The heart size and mediastinal contours are within normal limits. Both lungs are clear. The visualized skeletal structures are unremarkable. IMPRESSION: No active cardiopulmonary disease. Electronically Signed   By: Danae Orleans M.D.   On: 09/10/2019 13:56   US Abdomen Limited Ruq  Result Date: 09/10/2019 CLINICAL DATA:  Abdominal swelling, epigastric pain, liver failure EXAM: ULTRASOUND ABDOMEN LIMITED RIGHT UPPER QUADRANT COMPARISON:  Abdominal ultrasound 01/31/2015 FINDINGS: Gallbladder: The gallbladder wall is mildly thickened measuring 0.4 cm. Small gallstones measuring up to 0.9 cm. Negative Murphy sign. Common bile duct: Diameter: 0.5 cm Liver: No focal lesion identified. Diffusely increased echogenicity. The liver echotexture is coarse. Portal vein is patent on color Doppler imaging with normal direction of blood flow towards the liver. Recanalized umbilical vein. Other: Mild right upper quadrant ascites.  IMPRESSION: 1. Diffusely increased liver echogenicity with coarse echotexture, likely due to hepatic steatosis. 2. Mild gallbladder wall thickening which is nonspecific and can be seen in the setting of fluid overload, hypoalbuminemia, acute inflammation, along others. Small gallstones. 3.  Mild right upper quadrant ascites. Electronically Signed   By: Emmaline Kluver M.D.   On: 09/10/2019 16:11    Pending Labs Unresulted Labs (From admission, onward)    Start     Ordered   09/11/19 2200  Potassium  Once-Timed,   STAT     09/11/19 1451   09/10/19 2355  Protein, body fluid (other)  Once,   R     09/10/19 2355   09/10/19 2351  Body fluid culture ( includes gram stain)  Once,   STAT    Question Answer Comment  Are there also cytology or pathology orders on this specimen? No   Patient immune status Normal      09/10/19 2351   09/10/19 2351  Triglycerides, Body Fluid  Once,   STAT     09/10/19 2351   09/10/19 2351  Total bilirubin, body fluid  Once,   STAT     09/10/19 2351   Signed and Held  HIV Antibody (routine testing w rflx)  (HIV Antibody (Routine testing w reflex) panel)  Once,   R     Signed and Held   Signed and Held  Comprehensive metabolic panel  Tomorrow morning,   R     Signed and Held   Signed and Held  CBC  Tomorrow morning,   R     Signed and Held          Vitals/Pain Today's Vitals   09/11/19 0918 09/11/19 1056 09/11/19 1520 09/11/19 1522  BP:  107/72 108/63 107/69  Pulse:  (!) 101  99  Resp:    18  Temp:    99.2 F (37.3 C)  TempSrc:    Oral  SpO2:  99%  99%  Weight:      Height:      PainSc: 0-No pain   0-No pain    Isolation Precautions No active isolations  Medications Medications  acetylcysteine (ACETADOTE) 40 mg/mL load via infusion 11,880 mg (11,880 mg Intravenous Bolus from Bag 09/10/19 1939)    Followed by  acetylcysteine (ACETADOTE) 24,000 mg in dextrose 5 % 600 mL (40 mg/mL) infusion (15 mg/kg/hr  79.2 kg Intravenous New Bag/Given  09/10/19 2048)  ondansetron (ZOFRAN) injection 4 mg (has no administration in time range)  potassium chloride SA (KLOR-CON) CR tablet 40 mEq (40 mEq Oral Given 09/11/19 1100)  0.9 %  sodium chloride infusion ( Intravenous Stopped 09/10/19 2220)  potassium chloride SA (KLOR-CON) CR tablet 40 mEq (40 mEq Oral Given 09/11/19 0253)    Mobility walks Low fall risk   Focused Assessments Cardiac Assessment Handoff:    No results found for: CKTOTAL, CKMB, CKMBINDEX, TROPONINI No results found for: DDIMER Does the Patient currently have chest pain? No      R Recommendations: See Admitting Provider Note  Report given to:   Additional Notes: none

## 2019-09-11 NOTE — ED Notes (Signed)
Report called to sarah rn icu nurse 

## 2019-09-11 NOTE — H&P (Addendum)
[facility] [place] at [facility]   PATIENT NAME: Lindsey Huang    MR#:  [id number]  DATE OF BIRTH:  March 03, 1981  DATE OF ADMISSION:  09/10/2019  PRIMARY CARE PHYSICIAN: Patient, No Pcp Per   REQUESTING/REFERRING PHYSICIAN:   CHIEF COMPLAINT:   Chief Complaint  Patient presents with  . Abdominal Pain  . Jaundice    HISTORY OF PRESENT ILLNESS: Lindsey Huang  is a 38 y.o. female with a known history of incompetent cervix presented to the emergency room initially on 09/10/2019 for evaluation since seen and abdominal discomfort and nausea.  Patient was worked up and found to have jaundice bilirubin greater than 20.  She has history of heavy alcohol abuse in the past.  Recently she had some fever and myalgias she took Tylenol and TheraFlu.  She is started on N-acetylcysteine drip in the emergency room.  She also has ascites and abdominal distention paracentesis was done SBP was ruled out.  Case was discussed with the [facility] hepatology service patient has been accepted but no beds are available currently.  Hospitalist service has been consulted here.  Has a loose discoloration of the skin conjunctiva.  Has abdominal distention secondary to ascites.  No fever.  COVID-19 test negative.  PAST MEDICAL HISTORY:   Past Medical History:  Diagnosis Date  . Blood transfusion without reported diagnosis 2010   misscarriage  . Hx of incompetent cervix, currently pregnant   . Medical history non-contributory     PAST SURGICAL HISTORY:  Past Surgical History:  Procedure Laterality Date  . CERVICAL CERCLAGE     x4    SOCIAL HISTORY:  Social History   Tobacco Use  . Smoking status: Current Every Day Smoker    Types: Cigarettes    Last attempt to quit: 07/12/2013    Years since quitting: 6.1  . Smokeless tobacco: Never Used  Substance Use Topics  . Alcohol use: Yes    Comment: occ    FAMILY HISTORY:  Family History  Problem Relation Age of Onset  . Depression  Maternal Aunt   . Alcohol abuse Neg Hx   . Arthritis Neg Hx   . Asthma Neg Hx   . Birth defects Neg Hx   . Cancer Neg Hx   . COPD Neg Hx   . Drug abuse Neg Hx   . Early death Neg Hx   . Diabetes Neg Hx   . Hearing loss Neg Hx   . Heart disease Neg Hx   . Hyperlipidemia Neg Hx   . Hypertension Neg Hx   . Kidney disease Neg Hx   . Learning disabilities Neg Hx   . Mental illness Neg Hx   . Mental retardation Neg Hx   . Miscarriages / Stillbirths Neg Hx   . Stroke Neg Hx   . Vision loss Neg Hx     DRUG ALLERGIES: No Known Allergies  REVIEW OF SYSTEMS:   CONSTITUTIONAL: No fever, has fatigue and weakness.  EYES: No blurred or double vision.  EARS, NOSE, AND THROAT: No tinnitus or ear pain.  RESPIRATORY: No cough, shortness of breath, wheezing or hemoptysis.  CARDIOVASCULAR: No chest pain, orthopnea, edema.  GASTROINTESTINAL: No nausea, vomiting, diarrhea or abdominal pain.  Has abdominal distention GENITOURINARY: No dysuria, hematuria.  ENDOCRINE: No polyuria, nocturia,  HEMATOLOGY: No anemia, easy bruising or bleeding SKIN: Yellowish discoloration of skin MUSCULOSKELETAL: No joint pain or arthritis.   NEUROLOGIC: No tingling, numbness, weakness.  PSYCHIATRY: No anxiety or depression.  MEDICATIONS AT HOME:  Prior to Admission medications   Medication Sig Start Date End Date Taking? Authorizing Provider  amoxicillin-clavulanate (AUGMENTIN) 875-125 MG tablet Take 1 tablet by mouth 2 (two) times daily. Patient not taking: Reported on 09/10/2019 11/20/17   Kem Boroughs B, FNP  metoCLOPramide (REGLAN) 10 MG tablet Take 1 tablet (10 mg total) by mouth every 8 (eight) hours as needed for nausea or vomiting. 10/01/15 09/30/16  Sharyn Creamer, MD  nabumetone (RELAFEN) 750 MG tablet Take 1 tablet (750 mg total) by mouth 2 (two) times daily. Patient not taking: Reported on 09/10/2019 10/08/18   Menshew, Charlesetta Ivory, PA-C  ondansetron (ZOFRAN ODT) 4 MG disintegrating tablet Take 1  tablet (4 mg total) by mouth every 6 (six) hours as needed for nausea or vomiting. Patient not taking: Reported on 09/10/2019 10/01/15   Sharyn Creamer, MD  silver sulfADIAZINE (SILVADENE) 1 % cream Apply 1 application topically daily. Patient not taking: Reported on 09/10/2019 09/16/18   Harlene Salts A, PA-C      PHYSICAL EXAMINATION:   VITAL SIGNS: Blood pressure 107/72, pulse (!) 101, temperature 98.9 F (37.2 C), temperature source Oral, resp. rate 17, height 4\' 11"  (1.499 m), weight 79.2 kg, last menstrual period 09/07/2018, SpO2 99 %, unknown if currently breastfeeding.  GENERAL:  38 y.o.-year-old patient lying in the bed with no acute distress.  EYES: Pupils equal, round, reactive to light and accommodation  Extraocular muscles intact.  Scleral icterus noted HEENT: Head atraumatic, normocephalic. Oropharynx and nasopharynx clear.  NECK:  Supple, no jugular venous distention. No thyroid enlargement, no tenderness.  LUNGS: Normal breath sounds bilaterally, no wheezing, rales,rhonchi or crepitation. No use of accessory muscles of respiration.  CARDIOVASCULAR: S1, S2 normal. No murmurs, rubs, or gallops.  ABDOMEN: Soft, nontender, nondistended. Bowel sounds present. No organomegaly or mass.  Abdominal distention with ascites noted EXTREMITIES: No pedal edema, cyanosis, or clubbing.  NEUROLOGIC: Cranial nerves II through XII are intact. Muscle strength 5/5 in all extremities. Sensation intact. Gait not checked.  PSYCHIATRIC: The patient is alert and oriented x 3.  SKIN: Yellowish discoloration of skin  LABORATORY PANEL:   CBC Recent Labs  Lab 09/10/19 1320 09/11/19 0458  WBC 23.3* 17.2*  HGB 11.0* 9.4*  HCT 31.8* 26.9*  PLT 212 192  MCV 103.9* 102.7*  MCH 35.9* 35.9*  MCHC 34.6 34.9  RDW 18.4* 18.2*  LYMPHSABS  --  1.2  MONOABS  --  1.4*  EOSABS  --  0.2  BASOSABS  --  0.1    ------------------------------------------------------------------------------------------------------------------  Chemistries  Recent Labs  Lab 09/10/19 1320 09/10/19 2044 09/11/19 0458  NA 129*  --  127*  K 2.8*  --  2.6*  CL 90*  --  91*  CO2 25  --  28  GLUCOSE 108*  --  127*  BUN 5*  --  6  CREATININE UNABLE T O REPORT DUE TO ICTERUS UNABLE TO REPORT DUE TO ICTERUS <0.30*  CALCIUM 7.9*  --  7.4*  AST 111*  --  89*  ALT 23  --  21  ALKPHOS 212*  --  179*  BILITOT 25.5*  --  21.7*   ------------------------------------------------------------------------------------------------------------------ CrCl cannot be calculated (This lab value cannot be used to calculate CrCl because it is not a number: <0.30). ------------------------------------------------------------------------------------------------------------------ No results for input(s): TSH, T4TOTAL, T3FREE, THYROIDAB in the last 72 hours.  Invalid input(s): FREET3   Coagulation profile Recent Labs  Lab 09/10/19 1518 09/11/19 0458  INR 2.3* 2.5*   -------------------------------------------------------------------------------------------------------------------  No results for input(s): DDIMER in the last 72 hours. -------------------------------------------------------------------------------------------------------------------  Cardiac Enzymes No results for input(s): CKMB, TROPONINI, MYOGLOBIN in the last 168 hours.  Invalid input(s): CK ------------------------------------------------------------------------------------------------------------------ Invalid input(s): POCBNP  ---------------------------------------------------------------------------------------------------------------  Urinalysis    Component Value Date/Time   COLORURINE ORANGE (A) 09/10/2019 1928   APPEARANCEUR HAZY (A) 09/10/2019 1928   APPEARANCEUR Turbid 08/07/2012 0746   LABSPEC 1.012 09/10/2019 1928   LABSPEC 1.026  08/07/2012 0746   PHURINE  09/10/2019 1928    TEST NOT REPORTED DUE TO COLOR INTERFERENCE OF URINE PIGMENT   GLUCOSEU (A) 09/10/2019 1928    TEST NOT REPORTED DUE TO COLOR INTERFERENCE OF URINE PIGMENT   GLUCOSEU Negative 08/07/2012 0746   HGBUR (A) 09/10/2019 1928    TEST NOT REPORTED DUE TO COLOR INTERFERENCE OF URINE PIGMENT   BILIRUBINUR (A) 09/10/2019 1928    TEST NOT REPORTED DUE TO COLOR INTERFERENCE OF URINE PIGMENT   BILIRUBINUR Negative 08/07/2012 0746   KETONESUR (A) 09/10/2019 1928    TEST NOT REPORTED DUE TO COLOR INTERFERENCE OF URINE PIGMENT   PROTEINUR (A) 09/10/2019 1928    TEST NOT REPORTED DUE TO COLOR INTERFERENCE OF URINE PIGMENT   NITRITE (A) 09/10/2019 1928    TEST NOT REPORTED DUE TO COLOR INTERFERENCE OF URINE PIGMENT   LEUKOCYTESUR (A) 09/10/2019 1928    TEST NOT REPORTED DUE TO COLOR INTERFERENCE OF URINE PIGMENT   LEUKOCYTESUR Negative 08/07/2012 0746     RADIOLOGY: Dg Chest 2 View  Result Date: 09/10/2019 CLINICAL DATA:  Chest pain and shortness of breath for approximately 1 week. EXAM: CHEST - 2 VIEW COMPARISON:  None. FINDINGS: The heart size and mediastinal contours are within normal limits. Both lungs are clear. The visualized skeletal structures are unremarkable. IMPRESSION: No active cardiopulmonary disease. Electronically Signed   By: Danae OrleansJohn A Stahl M.D.   On: 09/10/2019 13:56   [place] Abdomen Limited Ruq  Result Date: 09/10/2019 CLINICAL DATA:  Abdominal swelling, epigastric pain, liver failure EXAM: ULTRASOUND ABDOMEN LIMITED RIGHT UPPER QUADRANT COMPARISON:  Abdominal ultrasound 01/31/2015 FINDINGS: Gallbladder: The gallbladder wall is mildly thickened measuring 0.4 cm. Small gallstones measuring up to 0.9 cm. Negative Murphy sign. Common bile duct: Diameter: 0.5 cm Liver: No focal lesion identified. Diffusely increased echogenicity. The liver echotexture is coarse. Portal vein is patent on color Doppler imaging with normal direction of blood flow  towards the liver. Recanalized umbilical vein. Other: Mild right upper quadrant ascites. IMPRESSION: 1. Diffusely increased liver echogenicity with coarse echotexture, likely due to hepatic steatosis. 2. Mild gallbladder wall thickening which is nonspecific and can be seen in the setting of fluid overload, hypoalbuminemia, acute inflammation, along others. Small gallstones. 3.  Mild right upper quadrant ascites. Electronically Signed   By: Emmaline KluverNancy  Ballantyne M.D.   On: 09/10/2019 16:11    EKG: Orders placed or performed during the hospital encounter of 09/10/19  . ED EKG  . ED EKG    IMPRESSION AND PLAN: 38 year old female patient with a known history of incompetent cervix presented to the emergency room initially on 09/10/2019 for evaluation since seen and abdominal discomfort and nausea.  Patient was worked up and found to have jaundice bilirubin greater than 20.  She has history of heavy alcohol abuse in the past.   -Acute liver failure [facility] Awaiting bed placement N-acetylcysteine infusion to continue Monitor LFTs Gastroenterology evaluation Avoid hepatotoxic drugs  -Ascites Status post paracentesis in the emergency room  -Hypokalemia acute Replace potassium  -Tobacco  abuse Tobacco cessation counseled to the patient for 6 minutes Nicotine patch offered  -DVT prophylaxis sequential compression device to lower extremities All the records are reviewed and case discussed with ED provider. Management plans discussed with the patient, family and they are in agreement.  CODE STATUS:Full code    TOTAL CRITICAL CARE TIME TAKING CARE OF THIS PATIENT: 52 minutes.    Saundra Shelling M.D on 09/11/2019 at 3:20 PM  Between 7am to 6pm - Pager - [phone]  After 6pm go to [email or web address] - password [email or web address] [place] [facility]  Office  [phone]  CC: Primary care physician; Patient, No Pcp Per

## 2019-09-11 NOTE — Progress Notes (Signed)
Spoke to poison control representative who reviewed patient's case over the phone. Their current recommendations are to recheck a PT/INR and check a lactic acid in the morning. They recommend keeping the patient on the acetylcysteine infusion until the INR is below 2 (last value 2.5).

## 2019-09-11 NOTE — ED Notes (Signed)
Poison Control updated with pts morning lab work.

## 2019-09-11 NOTE — ED Notes (Signed)
Pt provided breakfast tray.

## 2019-09-12 LAB — PROTEIN, BODY FLUID (OTHER): Total Protein, Body Fluid Other: 0.8 g/dL

## 2019-09-12 LAB — TRIGLYCERIDES, BODY FLUIDS: Triglycerides, Fluid: 45 mg/dL

## 2019-09-12 MED ORDER — VICON FORTE PO CAPS
17.00 | ORAL_CAPSULE | ORAL | Status: DC
Start: 2019-09-13 — End: 2019-09-12

## 2019-09-12 MED ORDER — Medication
1.00 | Status: DC
Start: 2019-09-13 — End: 2019-09-12

## 2019-09-12 MED ORDER — Medication
2.00 | Status: DC
Start: ? — End: 2019-09-12

## 2019-09-12 MED ORDER — GENTAK 0.3 % OP SOLN
1.00 | OPHTHALMIC | Status: DC
Start: 2019-09-13 — End: 2019-09-12

## 2019-09-12 MED ORDER — Medication
6.25 | Status: DC
Start: ? — End: 2019-09-12

## 2019-09-12 MED ORDER — Medication
Status: DC
Start: ? — End: 2019-09-12

## 2019-09-12 MED ORDER — TUSSI PRES-B 2-15-200 MG/5ML PO LIQD
0.50 | ORAL | Status: DC
Start: ? — End: 2019-09-12

## 2019-09-12 MED ORDER — OLANZAPINE-FLUOXETINE HCL 6-50 MG PO CAPS
6.00 | ORAL_CAPSULE | ORAL | Status: DC
Start: 2019-09-12 — End: 2019-09-12

## 2019-09-12 MED ORDER — Medication
10.00 | Status: DC
Start: 2019-09-13 — End: 2019-09-12

## 2019-09-14 LAB — TOTAL BILIRUBIN, BODY FLUID: Total bilirubin, fluid: 3.5 mg/dL

## 2019-09-14 LAB — BODY FLUID CULTURE
Culture: NO GROWTH
Special Requests: NORMAL

## 2019-09-15 LAB — CULTURE, BLOOD (ROUTINE X 2)
Culture: NO GROWTH
Culture: NO GROWTH
Special Requests: ADEQUATE
Special Requests: ADEQUATE

## 2019-10-18 ENCOUNTER — Inpatient Hospital Stay
Admission: EM | Admit: 2019-10-18 | Discharge: 2019-10-19 | DRG: 433 | Disposition: A | Discharge status: Home / Self Care | Payer: Medicaid Other | Attending: Family Medicine | Admitting: Family Medicine

## 2019-10-18 ENCOUNTER — Other Ambulatory Visit: Payer: Self-pay

## 2019-10-18 ENCOUNTER — Encounter: Payer: Self-pay | Admitting: Emergency Medicine

## 2019-10-18 DIAGNOSIS — E876 Hypokalemia: Secondary | ICD-10-CM | POA: Diagnosis present

## 2019-10-18 DIAGNOSIS — Z20828 Contact with and (suspected) exposure to other viral communicable diseases: Secondary | ICD-10-CM | POA: Diagnosis present

## 2019-10-18 DIAGNOSIS — F17210 Nicotine dependence, cigarettes, uncomplicated: Secondary | ICD-10-CM | POA: Diagnosis present

## 2019-10-18 DIAGNOSIS — D696 Thrombocytopenia, unspecified: Secondary | ICD-10-CM | POA: Diagnosis not present

## 2019-10-18 DIAGNOSIS — Z818 Family history of other mental and behavioral disorders: Secondary | ICD-10-CM

## 2019-10-18 DIAGNOSIS — K7290 Hepatic failure, unspecified without coma: Secondary | ICD-10-CM | POA: Diagnosis not present

## 2019-10-18 DIAGNOSIS — K7030 Alcoholic cirrhosis of liver without ascites: Principal | ICD-10-CM | POA: Diagnosis present

## 2019-10-18 DIAGNOSIS — K7040 Alcoholic hepatic failure without coma: Secondary | ICD-10-CM | POA: Diagnosis present

## 2019-10-18 DIAGNOSIS — N179 Acute kidney failure, unspecified: Secondary | ICD-10-CM | POA: Diagnosis present

## 2019-10-18 DIAGNOSIS — E860 Dehydration: Secondary | ICD-10-CM | POA: Diagnosis present

## 2019-10-18 DIAGNOSIS — K920 Hematemesis: Secondary | ICD-10-CM | POA: Diagnosis present

## 2019-10-18 DIAGNOSIS — K3189 Other diseases of stomach and duodenum: Secondary | ICD-10-CM | POA: Diagnosis present

## 2019-10-18 DIAGNOSIS — R17 Unspecified jaundice: Secondary | ICD-10-CM

## 2019-10-18 DIAGNOSIS — K7460 Unspecified cirrhosis of liver: Secondary | ICD-10-CM

## 2019-10-18 DIAGNOSIS — K766 Portal hypertension: Secondary | ICD-10-CM | POA: Diagnosis present

## 2019-10-18 DIAGNOSIS — D689 Coagulation defect, unspecified: Secondary | ICD-10-CM | POA: Diagnosis present

## 2019-10-18 DIAGNOSIS — D6959 Other secondary thrombocytopenia: Secondary | ICD-10-CM | POA: Diagnosis present

## 2019-10-18 DIAGNOSIS — D539 Nutritional anemia, unspecified: Secondary | ICD-10-CM | POA: Diagnosis present

## 2019-10-18 DIAGNOSIS — R1110 Vomiting, unspecified: Secondary | ICD-10-CM

## 2019-10-18 HISTORY — DX: Unspecified cirrhosis of liver: K74.60

## 2019-10-18 HISTORY — DX: Other diseases of stomach and duodenum: K31.89

## 2019-10-18 HISTORY — DX: Thrombocytopenia, unspecified: D69.6

## 2019-10-18 LAB — VITAMIN B12: Vitamin B-12: 1311 pg/mL — ABNORMAL HIGH (ref 180–914)

## 2019-10-18 LAB — BASIC METABOLIC PANEL
Anion gap: 14 (ref 5–15)
BUN: <5 mg/dL — ABNORMAL LOW (ref 6–20)
CO2: 24 mmol/L (ref 22–32)
Calcium: 8.6 mg/dL — ABNORMAL LOW (ref 8.9–10.3)
Chloride: 96 mmol/L — ABNORMAL LOW (ref 98–111)
Creatinine, Ser: 0.57 mg/dL (ref 0.44–1.00)
GFR calc Af Amer: >60 mL/min (ref >60)
GFR calc non Af Amer: >60 mL/min (ref >60)
Glucose, Bld: 136 mg/dL — ABNORMAL HIGH (ref 70–99)
Potassium: 2.5 mmol/L — CL (ref 3.5–5.1)
Sodium: 134 mmol/L — ABNORMAL LOW (ref 135–145)

## 2019-10-18 LAB — CBC
HCT: 29.8 % — ABNORMAL LOW (ref 36.0–46.0)
Hemoglobin: 10.4 g/dL — ABNORMAL LOW (ref 12.0–15.0)
MCH: 35.6 pg — ABNORMAL HIGH (ref 26.0–34.0)
MCHC: 34.9 g/dL (ref 30.0–36.0)
MCV: 102.1 fL — ABNORMAL HIGH (ref 80.0–100.0)
Platelets: 86 10*3/uL — ABNORMAL LOW (ref 150–400)
RBC: 2.92 MIL/uL — ABNORMAL LOW (ref 3.87–5.11)
RDW: 15 % (ref 11.5–15.5)
WBC: 5.4 10*3/uL (ref 4.0–10.5)
nRBC: 0 % (ref 0.0–0.2)

## 2019-10-18 LAB — HEPATIC FUNCTION PANEL
ALT: 45 U/L — ABNORMAL HIGH (ref 0–44)
AST: 59 U/L — ABNORMAL HIGH (ref 15–41)
Albumin: 3.9 g/dL (ref 3.5–5.0)
Alkaline Phosphatase: 148 U/L — ABNORMAL HIGH (ref 38–126)
Bilirubin, Direct: 2.9 mg/dL — ABNORMAL HIGH (ref 0.0–0.2)
Indirect Bilirubin: 2.8 mg/dL — ABNORMAL HIGH (ref 0.3–0.9)
Total Bilirubin: 5.7 mg/dL — ABNORMAL HIGH (ref 0.3–1.2)
Total Protein: 7.5 g/dL (ref 6.5–8.1)

## 2019-10-18 LAB — ACETAMINOPHEN LEVEL: Acetaminophen (Tylenol), Serum: <10 ug/mL — ABNORMAL LOW (ref 10–30)

## 2019-10-18 LAB — SARS CORONAVIRUS 2 (TAT 6-24 HRS): SARS Coronavirus 2: NEGATIVE

## 2019-10-18 LAB — URINALYSIS, COMPLETE (UACMP) WITH MICROSCOPIC
Bilirubin Urine: NEGATIVE
Glucose, UA: NEGATIVE mg/dL
Hgb urine dipstick: NEGATIVE
Ketones, ur: 5 mg/dL — AB
Leukocytes,Ua: NEGATIVE
Nitrite: NEGATIVE
Protein, ur: NEGATIVE mg/dL
Specific Gravity, Urine: 1.009 (ref 1.005–1.030)
pH: 7 (ref 5.0–8.0)

## 2019-10-18 LAB — TSH: TSH: 1.638 u[IU]/mL (ref 0.350–4.500)

## 2019-10-18 LAB — TYPE AND SCREEN
ABO/RH(D): A POS
Antibody Screen: NEGATIVE

## 2019-10-18 LAB — PROTIME-INR
INR: 1.1 (ref 0.8–1.2)
Prothrombin Time: 14.4 seconds (ref 11.4–15.2)

## 2019-10-18 LAB — PREGNANCY, URINE: Preg Test, Ur: NEGATIVE

## 2019-10-18 LAB — MAGNESIUM: Magnesium: 1.7 mg/dL (ref 1.7–2.4)

## 2019-10-18 LAB — PHOSPHORUS: Phosphorus: 2.2 mg/dL — ABNORMAL LOW (ref 2.5–4.6)

## 2019-10-18 MED ORDER — OCTREOTIDE LOAD VIA INFUSION
50.0000 ug | Freq: Once | INTRAVENOUS | Status: AC
  Administered 2019-10-18: 50 ug via INTRAVENOUS
  Filled 2019-10-18 (×4): qty 25

## 2019-10-18 MED ORDER — SODIUM CHLORIDE 0.9 % IV SOLN
80.0000 mg | Freq: Once | INTRAVENOUS | Status: DC
  Filled 2019-10-18 (×2): qty 80

## 2019-10-18 MED ORDER — SENNOSIDES-DOCUSATE SODIUM 8.6-50 MG PO TABS: 1.0000 | ORAL_TABLET | Freq: Every evening | ORAL | Status: DC | PRN

## 2019-10-18 MED ORDER — SODIUM CHLORIDE 0.9 % IV SOLN
8.0000 mg/h | INTRAVENOUS | Status: DC
  Filled 2019-10-18: qty 80

## 2019-10-18 MED ORDER — MORPHINE SULFATE (PF) 4 MG/ML IV SOLN
4.0000 mg | Freq: Once | INTRAVENOUS | Status: AC
  Administered 2019-10-18: 4 mg via INTRAVENOUS
  Filled 2019-10-18: qty 1

## 2019-10-18 MED ORDER — POTASSIUM CHLORIDE CRYS ER 20 MEQ PO TBCR
40.0000 meq | EXTENDED_RELEASE_TABLET | ORAL | Status: AC
  Administered 2019-10-18 (×2): 40 meq via ORAL
  Filled 2019-10-18 (×2): qty 2

## 2019-10-18 MED ORDER — SODIUM CHLORIDE 0.9 % IV SOLN
1.0000 g | INTRAVENOUS | Status: DC
  Administered 2019-10-19: 1 g via INTRAVENOUS
  Filled 2019-10-18: qty 1

## 2019-10-18 MED ORDER — SODIUM CHLORIDE 0.9 % IV SOLN
50.0000 ug/h | INTRAVENOUS | Status: DC
  Administered 2019-10-18 – 2019-10-19 (×2): 50 ug/h via INTRAVENOUS
  Filled 2019-10-18 (×7): qty 5

## 2019-10-18 MED ORDER — PANTOPRAZOLE SODIUM 40 MG IV SOLR
40.0000 mg | Freq: Two times a day (BID) | INTRAVENOUS | Status: DC
  Administered 2019-10-18 – 2019-10-19 (×2): 40 mg via INTRAVENOUS
  Filled 2019-10-18 (×2): qty 40

## 2019-10-18 MED ORDER — OXYCODONE HCL 5 MG PO TABS
5.0000 mg | ORAL_TABLET | Freq: Four times a day (QID) | ORAL | Status: DC | PRN
  Administered 2019-10-18 – 2019-10-19 (×4): 5 mg via ORAL
  Filled 2019-10-18 (×4): qty 1

## 2019-10-18 MED ORDER — ONDANSETRON HCL 4 MG PO TABS
4.0000 mg | ORAL_TABLET | Freq: Four times a day (QID) | ORAL | Status: DC | PRN
  Administered 2019-10-18 (×2): 4 mg via ORAL
  Filled 2019-10-18 (×2): qty 1

## 2019-10-18 MED ORDER — SODIUM CHLORIDE 0.9 % IV BOLUS
1000.0000 mL | Freq: Once | INTRAVENOUS | Status: AC
  Administered 2019-10-18: 1000 mL via INTRAVENOUS

## 2019-10-18 MED ORDER — POTASSIUM CHLORIDE CRYS ER 20 MEQ PO TBCR
40.0000 meq | EXTENDED_RELEASE_TABLET | Freq: Once | ORAL | Status: AC
  Administered 2019-10-18: 40 meq via ORAL
  Filled 2019-10-18: qty 2

## 2019-10-18 MED ORDER — BISACODYL 5 MG PO TBEC: 5.0000 mg | DELAYED_RELEASE_TABLET | Freq: Every day | ORAL | Status: DC | PRN

## 2019-10-18 MED ORDER — PANTOPRAZOLE SODIUM 40 MG IV SOLR: 40.0000 mg | Freq: Two times a day (BID) | INTRAVENOUS | Status: DC

## 2019-10-18 MED ORDER — ONDANSETRON HCL 4 MG/2ML IJ SOLN
4.0000 mg | Freq: Four times a day (QID) | INTRAMUSCULAR | Status: DC | PRN
  Administered 2019-10-18 – 2019-10-19 (×2): 4 mg via INTRAVENOUS
  Filled 2019-10-18 (×2): qty 2

## 2019-10-18 MED ORDER — MAGNESIUM SULFATE 2 GM/50ML IV SOLN
2.0000 g | Freq: Once | INTRAVENOUS | Status: AC
  Administered 2019-10-18: 2 g via INTRAVENOUS
  Filled 2019-10-18: qty 50

## 2019-10-18 MED ORDER — SODIUM CHLORIDE 0.9 % IV SOLN
2.0000 g | Freq: Once | INTRAVENOUS | Status: AC
  Administered 2019-10-18: 2 g via INTRAVENOUS
  Filled 2019-10-18: qty 20

## 2019-10-18 MED ORDER — ONDANSETRON HCL 4 MG/2ML IJ SOLN
4.0000 mg | Freq: Once | INTRAMUSCULAR | Status: AC
  Administered 2019-10-18: 4 mg via INTRAVENOUS
  Filled 2019-10-18: qty 2

## 2019-10-18 MED ORDER — POTASSIUM PHOSPHATES 15 MMOLE/5ML IV SOLN
20.0000 mmol | Freq: Once | INTRAVENOUS | Status: AC
  Administered 2019-10-18: 20 mmol via INTRAVENOUS
  Filled 2019-10-18: qty 6.67

## 2019-10-18 NOTE — ED Provider Notes (Signed)
[facility] Emergency Department Provider Note  ____________________________________________   First MD Initiated Contact with Patient 10/18/19 [phone]     (approximate)  I have reviewed the triage vital signs and the nursing notes.  History  Chief Complaint Abdominal Pain    HPI Lindsey Huang is a 38 y.o. female with history of liver failure/cirrhosis (recent extensive admission at [facility] in October) who presents to the emergency department for generalized malaise, nausea, vomiting, diarrhea.  Patient states since her discharge she was feeling improved until a few days ago when she developed the above symptoms.  She reports multiple episodes of emesis and has noted some bright red blood in her emesis.  She has been on steroids since her discharge, recently completed her taper.  She denies any heavy NSAID or alcohol use.  She denies any endoscopy during her hospitalization or known varices.  She denies any fevers or sick contacts.  She reports some upper abdominal discomfort.  Patient reports her ascites is improved compared to her hospitalization.   Past Medical Hx Past Medical History:  Diagnosis Date  . Blood transfusion without reported diagnosis 2010   misscarriage  . Hx of incompetent cervix, currently pregnant   . Medical history non-contributory     Problem List Patient Active Problem List   Diagnosis Date Noted  . Liver failure ([place]) 09/11/2019  . Indication for care in labor or delivery 12/25/2013  . NVD (normal vaginal delivery) 12/25/2013    Past Surgical Hx Past Surgical History:  Procedure Laterality Date  . CERVICAL CERCLAGE     x4    Medications Prior to Admission medications   Not on File    Allergies Patient has no known allergies.  Family Hx Family History  Problem Relation Age of Onset  . Depression Maternal Aunt   . Alcohol abuse Neg Hx   . Arthritis Neg Hx   . Asthma Neg Hx   . Birth defects Neg Hx   . Cancer Neg Hx    . COPD Neg Hx   . Drug abuse Neg Hx   . Early death Neg Hx   . Diabetes Neg Hx   . Hearing loss Neg Hx   . Heart disease Neg Hx   . Hyperlipidemia Neg Hx   . Hypertension Neg Hx   . Kidney disease Neg Hx   . Learning disabilities Neg Hx   . Mental illness Neg Hx   . Mental retardation Neg Hx   . Miscarriages / Stillbirths Neg Hx   . Stroke Neg Hx   . Vision loss Neg Hx     Social Hx Social History   Tobacco Use  . Smoking status: Current Every Day Smoker    Types: Cigarettes    Last attempt to quit: 07/12/2013    Years since quitting: 6.2  . Smokeless tobacco: Never Used  Substance Use Topics  . Alcohol use: Yes    Comment: occ  . Drug use: No     Review of Systems  Constitutional: Negative for fever, chills. + malaise Eyes: Negative for visual changes. ENT: Negative for sore throat. Cardiovascular: Negative for chest pain. Respiratory: Negative for shortness of breath. Gastrointestinal: + for nausea, vomiting, diarrhea.  Genitourinary: Negative for dysuria. Musculoskeletal: Negative for leg swelling. Skin: Negative for rash. Neurological: Negative for for headaches.   Physical Exam  Vital Signs: ED Triage Vitals  Enc Vitals Group     BP 10/18/19 0604 122/67     Pulse Rate 10/18/19  0604 (!) 126     Resp 10/18/19 0604 18     Temp 10/18/19 0604 97.9 F (36.6 C)     Temp Source 10/18/19 0604 Oral     SpO2 10/18/19 0604 100 %     Weight 10/18/19 0559 160 lb (72.6 kg)     Height 10/18/19 0559 4\' 11"  (1.499 m)     Head Circumference --      Peak Flow --      Pain Score 10/18/19 0559 9     Pain Loc --      Pain Edu? --      Excl. in GC? --     Constitutional: Alert and oriented. Appears uncomfortable.  Head: Normocephalic. Atraumatic. Eyes: Conjunctivae clear. Sclera significantly icteric.  Nose: No congestion. No rhinorrhea. Mouth/Throat: Wearing mask.  Neck: No stridor.   Cardiovascular: Tachycardic.  Extremities well perfused. Respiratory:  Normal respiratory effort.  Lungs CTAB. Gastrointestinal: Soft. Mild upper abdominal tenderness. No significant ascites or fluid wave.  Musculoskeletal: No lower extremity edema. No deformities. Neurologic:  Normal speech and language. No gross focal neurologic deficits are appreciated.  Skin: Skin is warm, dry and intact. No rash noted. Psychiatric: Mood and affect are appropriate for situation.  EKG  Personally reviewed.   Rate: 115 Rhythm: sinus Axis: LAD Intervals: WNl Sinus tachycardia, no acute ischemic changes No STEMI    Radiology  N/A   Procedures  Procedure(s) performed (including critical care):  .Critical Care Performed by: 10/20/19., MD Authorized by: Miguel Aschoff., MD   Critical care provider statement:    Critical care time (minutes):  45   Critical care was necessary to treat or prevent imminent or life-threatening deterioration of the following conditions: GIB in cirrhosis patient.   Critical care was time spent personally by me on the following activities:  Discussions with consultants, evaluation of patient's response to treatment, examination of patient, ordering and performing treatments and interventions, ordering and review of laboratory studies, ordering and review of radiographic studies, pulse oximetry, re-evaluation of patient's condition, obtaining history from patient or surrogate and review of old charts     Initial Impression / Assessment and Plan / ED Course  38 y.o. female who presents to the ED for nausea, vomiting (w/ blood), diarrhea, generalized malaise. Hx of liver failure/cirrhosis, recently admitted at [facility] in October. Recently completed steroid taper.  Ddx: GI bleed 2/2 ulcer (from steroid use) vs variceal bleed vs worsening liver failure, dehydration   Tachycardic, but normotensive.  Mild upper abdominal tenderness, but no significant distention, fluid wave, fever suggestive of SBP.  Labs reveal stable H&H, does not  require transfusion at this time.  Bilirubin elevated, but improved from prior.  Normal INR.  Electrolytes consistent with dehydration, hyponatremia and hypochloremia, significant hypokalemia.  Receiving IVF, potassium repletion.  Given concern for potential GI bleed in setting of cirrhosis, will give ceftriaxone, octreotide, PPI.  Discussed need for admission, patient is agreeable.  Discussed with hospitalist for admission.   Final Clinical Impression(s) / ED Diagnosis  Final diagnoses:  Vomiting in adult  Hepatic cirrhosis, unspecified hepatic cirrhosis type, unspecified whether ascites present (HCC)  Hypokalemia       Note:  This document was prepared using Dragon voice recognition software and may include unintentional dictation errors.   November., MD 10/18/19 [phone]

## 2019-10-18 NOTE — ED Triage Notes (Signed)
Patient ambulatory to triage with steady gait, without difficulty or distress noted, skin & sclera jaundiced; pt c/o rt upper abd pain x 2 days accomp by V/D; st noted blood in emesis this morning; tx to [facility] 10/19 for acute liver failure secondary to theraflu & acetaminophen

## 2019-10-18 NOTE — Consult Note (Signed)
[place] for Electrolyte Monitoring and Replacement   Recent Labs: Potassium (mmol/L)  Date Value  10/18/2019 2.5 (LL)  08/07/2012 3.8   Magnesium (mg/dL)  Date Value  10/18/2019 1.7   Calcium (mg/dL)  Date Value  10/18/2019 8.6 (L)   Calcium, Total (mg/dL)  Date Value  08/07/2012 9.0   Albumin (g/dL)  Date Value  10/18/2019 3.9  08/07/2012 4.4   Phosphorus (mg/dL)  Date Value  10/18/2019 2.2 (L)   Sodium (mmol/L)  Date Value  10/18/2019 134 (L)  08/07/2012 137   Corrected Ca: 8.7 mg/dL  Assessment: 38 y.o. female with medical history significant of alcohol induced hepatitis/cirrhosis recently admitted to [place] system for decompensated liver failure presents to the hospital with abdominal discomfort, hematemesis, nausea and diarrhea.  Goal of Therapy:  Electrolytes WNL  Plan:   Potassium replaced with a total of 120 mEq oral KCl by Dr Reesa Chew  Magnesium borderline low: replace with 2 grams IV magnesium sulfate  20 mmol IV potassium phosphate: this provides an additional 29 mEq potassium  CMP, magnesium and phosphorous level in am  Dallie Piles ,PharmD Clinical Pharmacist 10/18/2019 1:18 PM

## 2019-10-18 NOTE — H&P (Addendum)
History and Physical    Lindsey Huang WUJ:[id number] DOB: [birth year] DOA: 10/18/2019  PCP: System, Provider Not In Patient coming from: Home  Chief Complaint: Abdominal discomfort  HPI: Lindsey Huang is a 38 y.o. female with medical history significant of alcohol induced hepatitis/cirrhosis recently admitted to [facility] system for decompensated liver failure presents to the hospital with abdominal discomfort, hematemesis, nausea and diarrhea.  Patient had been experiencing the symptoms over the course the last 2-4 days but due to worsening of her weakness she decided to come to the ER for further evaluation.  Patient was admitted there with decompensated liver failure initially thought to be due to Tylenol overdose and then alcohol use.  Briefly required N-acetylcysteine which was stopped by hepatology.  Had large volume paracentesis performed and 3 L was removed which was negative.  Started on prednisolone on 10/26 for 28 days followed by a short taper course.  For coagulopathy also received vitamin K.  Followed up outpatient where her diuretics were adjusted.  Hepatitis screen, HIV, ceruloplasmin were negative.  Echocardiogram showed ejection fraction greater than 55%.  Liver ultrasound with Dopplers showed cirrhosis, splenomegaly but no clots.  MRI of the abdomen was also consistent with hepatomegaly, splenomegaly, no evidence of HCC.  Follows at the transplant clinic.  Earlier in the month due to fluid overload, her Lasix and Aldactone doses were increased.  By the time she came to the ER her hematemesis, nausea and diarrhea had resolved.  Reported of mild generalized abdominal discomfort.  She was started on octreotide drip, PPI drip.  Medical team was requested to admit the patient.   Review of Systems: As per HPI otherwise 10 point review of systems negative.  Review of Systems Otherwise negative except as per HPI, including: General: Denies fever, chills, night sweats or unintended  weight loss. Resp: Denies cough, wheezing, shortness of breath. Cardiac: Denies chest pain, palpitations, orthopnea, paroxysmal nocturnal dyspnea. GI: Denies  vomiting,constipation GU: Denies dysuria, frequency, hesitancy or incontinence MS: Denies muscle aches, joint pain or swelling Neuro: Denies headache, neurologic deficits (focal weakness, numbness, tingling), abnormal gait Psych: Denies anxiety, depression, SI/HI/AVH Skin: Denies new rashes or lesions ID: Denies sick contacts, exotic exposures, travel  Past Medical History:  Diagnosis Date  . Blood transfusion without reported diagnosis 2010   misscarriage  . Hx of incompetent cervix, currently pregnant   . Medical history non-contributory     Past Surgical History:  Procedure Laterality Date  . CERVICAL CERCLAGE     x4    SOCIAL HISTORY:  reports that she has been smoking cigarettes. She has never used smokeless tobacco. She reports current alcohol use. She reports that she does not use drugs.  No Known Allergies  FAMILY HISTORY: Family History  Problem Relation Age of Onset  . Depression Maternal Aunt   . Alcohol abuse Neg Hx   . Arthritis Neg Hx   . Asthma Neg Hx   . Birth defects Neg Hx   . Cancer Neg Hx   . COPD Neg Hx   . Drug abuse Neg Hx   . Early death Neg Hx   . Diabetes Neg Hx   . Hearing loss Neg Hx   . Heart disease Neg Hx   . Hyperlipidemia Neg Hx   . Hypertension Neg Hx   . Kidney disease Neg Hx   . Learning disabilities Neg Hx   . Mental illness Neg Hx   . Mental retardation Neg Hx   . Miscarriages / Stillbirths Neg  Hx   . Stroke Neg Hx   . Vision loss Neg Hx      Prior to Admission medications   Not on File    Physical Exam: Vitals:   10/18/19 0800 10/18/19 0900 10/18/19 0930 10/18/19 1005  BP: 118/69 112/70 111/70 (!) 103/55  Pulse: (!) 113 (!) 110 (!) 114 (!) 114  Resp:  14 14 20   Temp:    98.4 F (36.9 C)  TempSrc:    Oral  SpO2: 99% 96% 98% 100%  Weight:      Height:           Constitutional: NAD, calm, comfortable Eyes: Scleral icterus Neck: normal, supple, no masses, no thyromegaly Respiratory: clear to auscultation bilaterally, no wheezing, no crackles. Normal respiratory effort. No accessory muscle use.  Cardiovascular: Regular rate and rhythm, no murmurs / rubs / gallops. No extremity edema. 2+ pedal pulses. No carotid bruits.  Abdomen: Distended abdomen with slight fluid wave shift Musculoskeletal: no clubbing / cyanosis. No joint deformity upper and lower extremities. Good ROM, no contractures. Normal muscle tone.  Skin: Jaundiced skin Neurologic: CN 2-12 grossly intact. Sensation intact, DTR normal. Strength 5/5 in all 4.  Psychiatric: Normal judgment and insight. Alert and oriented x 3. Normal mood.     Labs on Admission: I have personally reviewed following labs and imaging studies  CBC: Recent Labs  Lab 10/18/19 0649  WBC 5.4  HGB 10.4*  HCT 29.8*  MCV 102.1*  PLT 86*   Basic Metabolic Panel: Recent Labs  Lab 10/18/19 0649  NA 134*  K 2.5*  CL 96*  CO2 24  GLUCOSE 136*  BUN <5*  CREATININE 0.57  CALCIUM 8.6*   GFR: Estimated Creatinine Clearance: 81.2 mL/min (by C-G formula based on SCr of 0.57 mg/dL). Liver Function Tests: Recent Labs  Lab 10/18/19 0649  AST 59*  ALT 45*  ALKPHOS 148*  BILITOT 5.7*  PROT 7.5  ALBUMIN 3.9   No results for input(s): LIPASE, AMYLASE in the last 168 hours. No results for input(s): AMMONIA in the last 168 hours. Coagulation Profile: Recent Labs  Lab 10/18/19 0649  INR 1.1   Cardiac Enzymes: No results for input(s): CKTOTAL, CKMB, CKMBINDEX, TROPONINI in the last 168 hours. BNP (last 3 results) No results for input(s): PROBNP in the last 8760 hours. HbA1C: No results for input(s): HGBA1C in the last 72 hours. CBG: No results for input(s): GLUCAP in the last 168 hours. Lipid Profile: No results for input(s): CHOL, HDL, LDLCALC, TRIG, CHOLHDL, LDLDIRECT in the last 72  hours. Thyroid Function Tests: No results for input(s): TSH, T4TOTAL, FREET4, T3FREE, THYROIDAB in the last 72 hours. Anemia Panel: No results for input(s): VITAMINB12, FOLATE, FERRITIN, TIBC, IRON, RETICCTPCT in the last 72 hours. Urine analysis:    Component Value Date/Time   COLORURINE ORANGE (A) 09/10/2019 1928   APPEARANCEUR HAZY (A) 09/10/2019 1928   APPEARANCEUR Turbid 08/07/2012 0746   LABSPEC 1.012 09/10/2019 1928   LABSPEC 1.026 08/07/2012 0746   PHURINE  09/10/2019 1928    TEST NOT REPORTED DUE TO COLOR INTERFERENCE OF URINE PIGMENT   GLUCOSEU (A) 09/10/2019 1928    TEST NOT REPORTED DUE TO COLOR INTERFERENCE OF URINE PIGMENT   GLUCOSEU Negative 08/07/2012 0746   HGBUR (A) 09/10/2019 1928    TEST NOT REPORTED DUE TO COLOR INTERFERENCE OF URINE PIGMENT   BILIRUBINUR (A) 09/10/2019 1928    TEST NOT REPORTED DUE TO COLOR INTERFERENCE OF URINE PIGMENT   BILIRUBINUR Negative 08/07/2012  9242   KETONESUR (A) 09/10/2019 1928    TEST NOT REPORTED DUE TO COLOR INTERFERENCE OF URINE PIGMENT   PROTEINUR (A) 09/10/2019 1928    TEST NOT REPORTED DUE TO COLOR INTERFERENCE OF URINE PIGMENT   NITRITE (A) 09/10/2019 1928    TEST NOT REPORTED DUE TO COLOR INTERFERENCE OF URINE PIGMENT   LEUKOCYTESUR (A) 09/10/2019 1928    TEST NOT REPORTED DUE TO COLOR INTERFERENCE OF URINE PIGMENT   LEUKOCYTESUR Negative 08/07/2012 0746   Sepsis Labs: !!!!!!!!!!!!!!!!!!!!!!!!!!!!!!!!!!!!!!!!!!!! @LABRCNTIP (procalcitonin:4,lacticidven:4) )No results found for this or any previous visit (from the past 240 hour(s)).   Radiological Exams on Admission: No results found.   All images have been reviewed by me personally.    Assessment/Plan Principal Problem:   Decompensated hepatic cirrhosis (HCC) Active Problems:   Hypokalemia   Coagulopathy (HCC)   Total bilirubin, elevated   Hematemesis   Hematemesis, improved Acute decompensated liver cirrhosis; EToH? -Admit patient to the hospital.  PPI  IV twice daily -Already on octreotide drip, should be able to discontinue it.  Will await GI input. -GI consulted. -Supportive care.  Monitor hemoglobin. -Empiric IV Rocephin. -N.p.o. for now in case she needs endoscopic evaluation. -Follows outpatient transplant team at [facility]  *patient continues to claim she has completed her course of Lasix, aldactone and prednisone. She states her LE swelling has now resolved so she doesn't take diuretics anymore. According to Care everywhere she is still suppose to be on last few days of Prednisone taper.*  Hypokalemia -Repletion ordered  Transaminitis/elevated total bilirubin -Continue to trend and monitor her labs.  Macrocytic anemia -Check B12, folate, TSH  DVT prophylaxis: SCD Code Status:  full code Family Communication: None Disposition Plan: To be determined Consults called: Gastroenterology Admission status: Inpatient admission to MedSurg for decompensated liver cirrhosis.   Time Spent: 65 minutes.  >50% of the time was devoted to discussing the patients care, assessment, plan and disposition with other care givers along with counseling the patient about the risks and benefits of treatment.    Katlyne Nishida [facility] MD [facility]  If 7PM-7AM, please contact night-coverage   10/18/2019, 10:24 AM

## 2019-10-18 NOTE — Consult Note (Signed)
Lindsey Huang , MD [address], [address], [place], [place], [id number] [address], [address], [place], [place], [address] Phone: [phone]  Fax: [phone]  Consultation  Referring Provider: Dr Nelson ChimesAmin  Primary Care Physician:  Patient, No Pcp Per Primary Gastroenterologist:  [facility]          Reason for Consultation:     Decompensated liver disease  Date of Admission:  10/18/2019 Date of Consultation:  10/18/2019         HPI:   Lindsey Huang is a 38 y.o. female with a history of alcoholic liver cirrhosis , decompensated with ascites , ongoing alcohol use - [facility] -09/21/2019 with alcoholic hepatitis .Commenced on Prednisolone on 09/17/2019 for 28 days .[facility].   She presented to the ER earlier today with malaise,nausea,vomiting and diarrhea , some bright red emesis .Denies any recent alcohol use.  She states that the nausea and vomiting began about 2 days but gradually got worse and saw a tiny amount of blood in her vomitus bright red in color.  Denies any melena.  Since coming to the hospital she does not have any vomiting has no nausea but has some mild abdominal pain which seems to be getting significantly better.  Denies any NSAID use.  Not drink any alcohol.  Hb is infact higher than what it was a month back today at 10.4 grams whereas was 9.4 grams previously. Creatinine is double of what it was 1 month back,low chloride and potassium. INR 1.1 , acetaminophen level normal range.   Hepatic functions are better today than 1 month back. No prior EGD.    Past Medical History:  Diagnosis Date  . Blood transfusion without reported diagnosis 2010   misscarriage  . Hx of incompetent cervix, currently pregnant   . Medical history non-contributory     Past Surgical History:  Procedure Laterality Date  . CERVICAL CERCLAGE     x4    Prior to Admission medications   Not on File    Family History  Problem  Relation Age of Onset  . Depression Maternal Aunt   . Alcohol abuse Neg Hx   . Arthritis Neg Hx   . Asthma Neg Hx   . Birth defects Neg Hx   . Cancer Neg Hx   . COPD Neg Hx   . Drug abuse Neg Hx   . Early death Neg Hx   . Diabetes Neg Hx   . Hearing loss Neg Hx   . Heart disease Neg Hx   . Hyperlipidemia Neg Hx   . Hypertension Neg Hx   . Kidney disease Neg Hx   . Learning disabilities Neg Hx   . Mental illness Neg Hx   . Mental retardation Neg Hx   . Miscarriages / Stillbirths Neg Hx   . Stroke Neg Hx   . Vision loss Neg Hx      Social History   Tobacco Use  . Smoking status: Current Every Day Smoker    Types: Cigarettes    Last attempt to quit: 07/12/2013    Years since quitting: 6.2  . Smokeless tobacco: Never Used  Substance Use Topics  . Alcohol use: Yes    Comment: occ  . Drug use: No    Allergies as of 10/18/2019  . (No Known Allergies)    Review of Systems:    All systems reviewed and negative except where noted in HPI.  Physical Exam:  Vital signs in last 24 hours: Temp:  [97.9 F (36.6 C)] 97.9 F (36.6 C) (11/26 0604) Pulse Rate:  [118-126] 120 (11/26 0700) Resp:  [14-18] 18 (11/26 0700) BP: (119-122)/(67-72) 119/72 (11/26 0700) SpO2:  [97 %-100 %] 100 % (11/26 0700) Weight:  [72.6 kg] 72.6 kg (11/26 0559)   General:   Pleasant, cooperative in NAD Head:  Normocephalic and atraumatic. Eyes:   No icterus.   Conjunctiva pink. PERRLA. Ears:  Normal auditory acuity. Neck:  Supple; no masses or thyroidomegaly Lungs: Respirations even and unlabored. Lungs clear to auscultation bilaterally.   No wheezes, crackles, or rhonchi.  Heart:  Regular rate and rhythm;  Without murmur, clicks, rubs or gallops Abdomen:  Soft, nondistended, nontender. Normal bowel sounds. No appreciable masses or hepatomegaly.  No rebound or guarding.  Neurologic:  Alert and oriented x3;  grossly normal neurologically. Skin:  Intact without significant lesions or rashes.  Cervical Nodes:  No significant cervical adenopathy. Psych:  Alert and cooperative. Normal affect.  LAB RESULTS: Recent Labs    10/18/19 0649  WBC 5.4  HGB 10.4*  HCT 29.8*  PLT 86*   BMET Recent Labs    10/18/19 0649  NA 134*  K 2.5*  CL 96*  CO2 24  GLUCOSE 136*  BUN <5*  CREATININE 0.57  CALCIUM 8.6*   LFT Recent Labs    10/18/19 0649  PROT 7.5  ALBUMIN 3.9  AST 59*  ALT 45*  ALKPHOS 148*  BILITOT 5.7*  BILIDIR 2.9*  IBILI 2.8*   PT/INR Recent Labs    10/18/19 0649  LABPROT 14.4  INR 1.1    STUDIES: No results found.  MELD-Na score: 17 at 10/18/2019  6:49 AM MELD score: 14 at 10/18/2019  6:49 AM Calculated from: Serum Creatinine: 0.57 mg/dL (Rounded to 1 mg/dL) at 34/19/6222  9:79 AM Serum Sodium: 134 mmol/L at 10/18/2019  6:49 AM Total Bilirubin: 5.7 mg/dL at 89/[birth year]1941  7:40 AM INR(ratio): 1.1 at 10/18/2019  6:49 AM Age: 81 years   Impression / Plan:   Lindsey Huang is a 38 y.o. y/o female with with a history of alcoholic liver cirrhosis decompensated with ascites.  Recently admitted and discharged in October 2020 with alcoholic hepatitis on 28-day course of prednisolone.  Evaluated at [facility] for liver transplant.  She is here today with malaise nausea vomiting and some possible hematemesis.  Hemoglobin has been stable rather in fact higher than what it was in October 2020.  Hepatic function panel is much improved since October.  INR is 1.1.  Normal BUN/creatinine ratio suggest unlikely upper GI bleed.  In fact the only thing I can find abnormal at this point of time on her labs is her hypokalemia, creatinine which is doubled from baseline a month back probably suggesting acute kidney injury.  Her history of hematemesis is more supportive of Mallory-Weiss tear or effect of gastritis and less likely a variceal bleed.  Nausea, vomiting, diarrhea have resolved.  Plan 1.  IV PPI twice daily 2.  If having diarrhea send stool for C. difficile and GI PCR 3.   Monitor CBC and transfuse if needed. 4.  Rehydration due to AKI. 5.  Plan for EGD tomorrow 6.  If abdominal pain does not resolve or continues then would need abdominal imaging. looking into the gallbladder.  I have discussed alternative options, risks & benefits,  which include, but are not limited to, bleeding, infection, perforation,respiratory complication & drug reaction.  The patient agrees with  this plan & written consent will be obtained.     Thank you for involving me in the care of this patient.      LOS: 0 days   Jonathon Bellows, MD  10/18/2019, 8:12 AM

## 2019-10-18 NOTE — ED Notes (Signed)
Report given to Terri, RN.

## 2019-10-18 NOTE — ED Notes (Signed)
Per lab- pt potassium 2.5- will notify MD

## 2019-10-19 ENCOUNTER — Inpatient Hospital Stay: Payer: Medicaid Other | Admitting: Anesthesiology

## 2019-10-19 ENCOUNTER — Encounter
Admission: EM | Disposition: A | Discharge status: Home / Self Care | Payer: Self-pay | Source: Home / Self Care | Attending: Internal Medicine

## 2019-10-19 ENCOUNTER — Encounter: Payer: Self-pay | Admitting: Anesthesiology

## 2019-10-19 DIAGNOSIS — D696 Thrombocytopenia, unspecified: Secondary | ICD-10-CM

## 2019-10-19 DIAGNOSIS — R17 Unspecified jaundice: Secondary | ICD-10-CM

## 2019-10-19 DIAGNOSIS — D539 Nutritional anemia, unspecified: Secondary | ICD-10-CM

## 2019-10-19 HISTORY — PX: ESOPHAGOGASTRODUODENOSCOPY (EGD) WITH PROPOFOL: SHX5813

## 2019-10-19 LAB — CBC
HCT: 28.6 % — ABNORMAL LOW (ref 36.0–46.0)
Hemoglobin: 9.4 g/dL — ABNORMAL LOW (ref 12.0–15.0)
MCH: 34.3 pg — ABNORMAL HIGH (ref 26.0–34.0)
MCHC: 32.9 g/dL (ref 30.0–36.0)
MCV: 104.4 fL — ABNORMAL HIGH (ref 80.0–100.0)
Platelets: 101 10*3/uL — ABNORMAL LOW (ref 150–400)
RBC: 2.74 MIL/uL — ABNORMAL LOW (ref 3.87–5.11)
RDW: 15.6 % — ABNORMAL HIGH (ref 11.5–15.5)
WBC: 4.2 10*3/uL (ref 4.0–10.5)
nRBC: 0 % (ref 0.0–0.2)

## 2019-10-19 LAB — COMPREHENSIVE METABOLIC PANEL
ALT: 41 U/L (ref 0–44)
AST: 65 U/L — ABNORMAL HIGH (ref 15–41)
Albumin: 3.6 g/dL (ref 3.5–5.0)
Alkaline Phosphatase: 130 U/L — ABNORMAL HIGH (ref 38–126)
Anion gap: 10 (ref 5–15)
BUN: <5 mg/dL — ABNORMAL LOW (ref 6–20)
CO2: 24 mmol/L (ref 22–32)
Calcium: 8.3 mg/dL — ABNORMAL LOW (ref 8.9–10.3)
Chloride: 104 mmol/L (ref 98–111)
Creatinine, Ser: 0.46 mg/dL (ref 0.44–1.00)
GFR calc Af Amer: >60 mL/min (ref >60)
GFR calc non Af Amer: >60 mL/min (ref >60)
Glucose, Bld: 115 mg/dL — ABNORMAL HIGH (ref 70–99)
Potassium: 3.7 mmol/L (ref 3.5–5.1)
Sodium: 138 mmol/L (ref 135–145)
Total Bilirubin: 5.3 mg/dL — ABNORMAL HIGH (ref 0.3–1.2)
Total Protein: 7 g/dL (ref 6.5–8.1)

## 2019-10-19 LAB — MAGNESIUM: Magnesium: 2.5 mg/dL — ABNORMAL HIGH (ref 1.7–2.4)

## 2019-10-19 LAB — TSH: TSH: 0.706 u[IU]/mL (ref 0.350–4.500)

## 2019-10-19 LAB — PROTIME-INR
INR: 1.2 (ref 0.8–1.2)
Prothrombin Time: 15.4 seconds — ABNORMAL HIGH (ref 11.4–15.2)

## 2019-10-19 LAB — APTT: aPTT: 40 seconds — ABNORMAL HIGH (ref 24–36)

## 2019-10-19 LAB — PHOSPHORUS: Phosphorus: 2.1 mg/dL — ABNORMAL LOW (ref 2.5–4.6)

## 2019-10-19 LAB — HIV ANTIBODY (ROUTINE TESTING W REFLEX): HIV Screen 4th Generation wRfx: NONREACTIVE

## 2019-10-19 SURGERY — ESOPHAGOGASTRODUODENOSCOPY (EGD) WITH PROPOFOL
Anesthesia: General

## 2019-10-19 MED ORDER — LIDOCAINE HCL (PF) 2 % IJ SOLN
INTRAMUSCULAR | Status: AC
  Filled 2019-10-19: qty 10

## 2019-10-19 MED ORDER — NADOLOL 20 MG PO TABS: 20.0000 mg | ORAL_TABLET | Freq: Every day | ORAL | 0 refills | Status: DC

## 2019-10-19 MED ORDER — PROPOFOL 500 MG/50ML IV EMUL
INTRAVENOUS | Status: DC | PRN
  Administered 2019-10-19: 150 ug/kg/min via INTRAVENOUS

## 2019-10-19 MED ORDER — SODIUM CHLORIDE 0.9% FLUSH: 10.0000 mL | INTRAVENOUS | Status: DC | PRN

## 2019-10-19 MED ORDER — PROPOFOL 10 MG/ML IV BOLUS
INTRAVENOUS | Status: DC | PRN
  Administered 2019-10-19 (×2): 40 mg via INTRAVENOUS
  Administered 2019-10-19: 30 mg via INTRAVENOUS
  Administered 2019-10-19: 50 mg via INTRAVENOUS

## 2019-10-19 MED ORDER — OMEPRAZOLE 40 MG PO CPDR: 40.0000 mg | DELAYED_RELEASE_CAPSULE | Freq: Every day | ORAL | 1 refills | Status: DC

## 2019-10-19 MED ORDER — POTASSIUM PHOSPHATES 15 MMOLE/5ML IV SOLN
20.0000 mmol | Freq: Once | INTRAVENOUS | Status: AC
  Administered 2019-10-19: 20 mmol via INTRAVENOUS
  Filled 2019-10-19: qty 6.67

## 2019-10-19 MED ORDER — LACTATED RINGERS IV SOLN
INTRAVENOUS | Status: DC | PRN
  Administered 2019-10-19: 11:00:00 via INTRAVENOUS

## 2019-10-19 NOTE — Anesthesia Postprocedure Evaluation (Signed)
Anesthesia Post Note  Patient: Lindsey Huang  Procedure(s) Performed: ESOPHAGOGASTRODUODENOSCOPY (EGD) WITH PROPOFOL (N/A )  Patient location during evaluation: PACU Anesthesia Type: General     Last Vitals:  Vitals:   10/19/19 1137 10/19/19 1159  BP: 113/84 107/80  Pulse: 97 (!) 101  Resp: 16   Temp: 37.1 C 36.9 C  SpO2: 99% 99%    Last Pain:  Vitals:   10/19/19 1159  TempSrc: Oral  PainSc:                  [name]

## 2019-10-19 NOTE — Consult Note (Signed)
[facility] for Electrolyte Monitoring and Replacement   Recent Labs: Potassium (mmol/L)  Date Value  10/19/2019 3.7  08/07/2012 3.8   Magnesium (mg/dL)  Date Value  10/19/2019 2.5 (H)   Calcium (mg/dL)  Date Value  10/19/2019 8.3 (L)   Calcium, Total (mg/dL)  Date Value  08/07/2012 9.0   Albumin (g/dL)  Date Value  10/19/2019 3.6  08/07/2012 4.4   Phosphorus (mg/dL)  Date Value  10/19/2019 2.1 (L)   Sodium (mmol/L)  Date Value  10/19/2019 138  08/07/2012 137   Corrected Ca: 8.7 mg/dL  Assessment: 38 y.o. female with medical history significant of alcohol induced hepatitis/cirrhosis recently admitted to [facility] system for decompensated liver failure presents to the hospital with abdominal discomfort, hematemesis, nausea and diarrhea.  Goal of Therapy:  Electrolytes WNL  Plan:  K 3.7  Mag 2.5  Phos 2.1  Scr 0.46  Na 138  Will order 20 mmol IV potassium phosphate x 1  Will recheck phos/K at 1800  CMP and phosphorous level in am  Noralee Space ,PharmD Clinical Pharmacist 10/19/2019 7:44 AM

## 2019-10-19 NOTE — H&P (Signed)
Lindsey Bellows, MD [address] [place], [place], [place], [address] [address], [place], [place], [place], [address] Phone: [phone]  Fax: [phone]  Primary Care Physician:  System, Provider Not In   Pre-Procedure History & Physical: HPI:  Lindsey Huang is a 38 y.o. female is here for an endoscopy    Past Medical History:  Diagnosis Date  . Blood transfusion without reported diagnosis 2010   misscarriage  . Hx of incompetent cervix, currently pregnant   . Medical history non-contributory     Past Surgical History:  Procedure Laterality Date  . CERVICAL CERCLAGE     x4    Prior to Admission medications   Medication Sig Start Date End Date Taking? Authorizing Provider  Cholecalciferol (VITAMIN D-1000 MAX ST) 25 MCG (1000 UT) tablet Take 1,000 mg by mouth daily. 09/22/19 10/22/19 Yes [provider]  ferrous sulfate 325 (65 FE) MG tablet Take 325 mg by mouth daily with breakfast.   Yes [provider]  folic acid (FOLVITE) 1 MG tablet Take 1 mg by mouth daily. 09/22/19 09/21/20 Yes [provider]  Multiple Vitamins-Minerals (MULTIVITAMIN WOMEN) TABS Take 1 tablet by mouth daily.   Yes [provider]  thiamine (VITAMIN B-1) 100 MG tablet Take 100 mg by mouth daily. 09/26/19 09/25/20 Yes [provider]    Allergies as of 10/18/2019  . (No Known Allergies)    Family History  Problem Relation Age of Onset  . Depression Maternal Aunt   . Alcohol abuse Neg Hx   . Arthritis Neg Hx   . Asthma Neg Hx   . Birth defects Neg Hx   . Cancer Neg Hx   . COPD Neg Hx   . Drug abuse Neg Hx   . Early death Neg Hx   . Diabetes Neg Hx   . Hearing loss Neg Hx   . Heart disease Neg Hx   . Hyperlipidemia Neg Hx   . Hypertension Neg Hx   . Kidney disease Neg Hx   . Learning disabilities Neg Hx   . Mental illness Neg Hx   . Mental retardation Neg Hx   . Miscarriages / Stillbirths Neg Hx   . Stroke Neg Hx   . Vision loss  Neg Hx     Social History   Socioeconomic History  . Marital status: Single    Spouse name: Not on file  . Number of children: Not on file  . Years of education: Not on file  . Highest education level: Not on file  Occupational History  . Not on file  Social Needs  . Financial resource strain: Not on file  . Food insecurity    Worry: Not on file    Inability: Not on file  . Transportation needs    Medical: Not on file    Non-medical: Not on file  Tobacco Use  . Smoking status: Current Every Day Smoker    Types: Cigarettes    Last attempt to quit: 07/12/2013    Years since quitting: 6.2  . Smokeless tobacco: Never Used  Substance and Sexual Activity  . Alcohol use: Yes    Comment: occ  . Drug use: No  . Sexual activity: Yes    Birth control/protection: None  Lifestyle  . Physical activity    Days per week: Not on file    Minutes per session: Not on file  . Stress: Not on file  Relationships  . Social connections  Talks on phone: Not on file    Gets together: Not on file    Attends religious service: Not on file    Active member of club or organization: Not on file    Attends meetings of clubs or organizations: Not on file    Relationship status: Not on file  . Intimate partner violence    Fear of current or ex partner: Not on file    Emotionally abused: Not on file    Physically abused: Not on file    Forced sexual activity: Not on file  Other Topics Concern  . Not on file  Social History Narrative  . Not on file    Review of Systems: See HPI, otherwise negative ROS  Physical Exam: BP 111/68   Pulse (!) 112   Temp 98.2 F (36.8 C)   Resp 18   Ht 4\' 11"  (1.499 m)   Wt 72.1 kg   LMP 09/07/2018 (Approximate)   SpO2 100%   BMI 32.11 kg/m  General:   Alert,  pleasant and cooperative in NAD Head:  Normocephalic and atraumatic. Neck:  Supple; no masses or thyromegaly. Lungs:  Clear throughout to auscultation, normal respiratory effort.    Heart:   +S1, +S2, Regular rate and rhythm, No edema. Abdomen:  Soft, nontender and nondistended. Normal bowel sounds, without guarding, and without rebound.   Neurologic:  Alert and  oriented x4;  grossly normal neurologically.  Impression/Plan: Lindsey Huang is here for an endoscopy  to be performed for  evaluation of GI bleed    Risks, benefits, limitations, and alternatives regarding endoscopy have been reviewed with the patient.  Questions have been answered.  All parties agreeable.   [name], MD  10/19/2019, 10:43 AM

## 2019-10-19 NOTE — Transfer of Care (Signed)
Immediate Anesthesia Transfer of Care Note  Patient: Lindsey Huang  Procedure(s) Performed: ESOPHAGOGASTRODUODENOSCOPY (EGD) WITH PROPOFOL (N/A )  Patient Location: PACU  Anesthesia Type:General  Level of Consciousness: awake and oriented  Airway & Oxygen Therapy: Patient Spontanous Breathing and Patient connected to nasal cannula oxygen  Post-op Assessment: Report given to RN and Post -op Vital signs reviewed and stable  Post vital signs: Reviewed and stable  Last Vitals:  Vitals Value Taken Time  BP 105/73 10/19/19 1108  Temp    Pulse 97 10/19/19 1112  Resp 15 10/19/19 1112  SpO2 100 % 10/19/19 1112  Vitals shown include unvalidated device data.  Last Pain:  Vitals:   10/19/19 0949  TempSrc: Oral  PainSc:          Complications: No apparent anesthesia complications

## 2019-10-19 NOTE — Op Note (Signed)
[facility] Gastroenterology Patient Name: Lindsey SeveranceKelly Huang Procedure Date: 10/19/2019 10:51 AM MRN: [id number] Account #: [id number] Date of Birth: [birth year] Admit Type: Outpatient Age: 38 Room: [facility] ENDO ROOM 4 Gender: Female Note Status: Finalized Procedure:             Upper GI endoscopy Indications:           Hematemesis Providers:             Wyline MoodKiran Foster Frericks MD, MD Referring MD:          Wyline MoodKiran Tyrees Chopin MD, MD (Referring MD) Medicines:             Monitored Anesthesia Care Complications:         No immediate complications. Procedure:             Pre-Anesthesia Assessment:                        - Prior to the procedure, a History and Physical was                         performed, and patient medications, allergies and                         sensitivities were reviewed. The patient's tolerance                         of previous anesthesia was reviewed.                        - The risks and benefits of the procedure and the                         sedation options and risks were discussed with the                         patient. All questions were answered and informed                         consent was obtained.                        - ASA Grade Assessment: III - A patient with severe                         systemic disease.                        After obtaining informed consent, the endoscope was                         passed under direct vision. Throughout the procedure,                         the patient's blood pressure, pulse, and oxygen                         saturations were monitored continuously. The Endoscope                         was introduced through the mouth, and advanced  to the                         third part of duodenum. The upper GI endoscopy was                         accomplished with ease. The patient tolerated the                         procedure well. Findings:      The examined duodenum was normal.      The esophagus was  normal.      Moderate portal hypertensive gastropathy was found in the entire       examined stomach.      The cardia and gastric fundus were normal on retroflexion. Impression:            - Normal examined duodenum.                        - Normal esophagus.                        - Portal hypertensive gastropathy.                        - No specimens collected. Recommendation:        - Return patient to hospital ward for ongoing care.                        - Advance diet as tolerated.                        - Use Prilosec (omeprazole) 40 mg PO daily for 6 weeks.                        - Commence on nadolol 20 mg daily at discharge for                         gastropathy Procedure Code(s):     --- Professional ---                        [id number], Esophagogastroduodenoscopy, flexible,                         transoral; diagnostic, including collection of                         specimen(s) by brushing or washing, when performed                         (separate procedure) Diagnosis Code(s):     --- Professional ---                        K76.6, Portal hypertension                        K31.89, Other diseases of stomach and duodenum                        K92.0, Hematemesis CPT copyright 2019 American Medical Association. All  rights reserved. The codes documented in this report are preliminary and upon coder review may  be revised to meet current compliance requirements. Wyline Mood, MD Wyline Mood MD, MD 10/19/2019 11:04:44 AM This report has been signed electronically. Number of Addenda: 0 Note Initiated On: 10/19/2019 10:51 AM Estimated Blood Loss:  Estimated blood loss: none.      [facility]

## 2019-10-19 NOTE — Anesthesia Post-op Follow-up Note (Signed)
Anesthesia QCDR form completed.        

## 2019-10-19 NOTE — Discharge Summary (Signed)
Physician Discharge Summary  Lindsey SeveranceKelly Stroh ZOX:[id number] DOB: Dec 22, 1980 DOA: 10/18/2019  PCP: System, Provider Not In  Admit date: 10/18/2019 Discharge date: 10/19/2019  Recommendations for Outpatient Follow-up:  1. Follow-up portal hypertensive gastropathy, cirrhosis  Follow-up Information    Segovia, Suella GroveMaria Cristina, MD Follow up today.   Specialty: Gastroenterology Why: Keep scheduled appointment in December 2. Contact information: [address] [place] [place] [address] [phone]            Discharge Diagnoses: Principal diagnosis is #1 1. Hematemesis 2. Portal hypertensive gastropathy 3. Alcoholic liver cirrhosis, complications in past including ascites, thrombocytopenia 4. Thrombocytopenia 5. Hypokalemia, hypophosphatemia 6. 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  Admitted for further evaluation.  Hospital Course:  She is seen by gastroenterology, had no further bleeding.  She underwent EGD which showed portal hypertensive gastropathy.  Gastroenterology recommending Prilosec and nadolol on discharge.  I discussed with Dr. Tobi BastosAnna who felt the patient was stable for discharge this afternoon if she tolerated her diet.  Patient tolerated diet.  Discussed in detail with patient.  Individual issues as below.  Hematemesis in setting of known decompensated liver disease, cirrhosis.  Admitted for further evaluation, placed on octreotide infusion and seen by gastroenterology.  Hemoglobin higher than October 2020.  Hepatic function panel improved over October 2020.  INR  normal at 1.1. --Hemoglobin stable at 9.4  --status post EGD November 27, normal duodenum, esophagus.  Portal hypertensive gastropathy seen. --Gastroenterology recommended advance diet as tolerated.  Prilosec 40 mg daily for 6 weeks.  Nadolol 20 mg daily at discharge for gastropathy.  Alcoholic liver cirrhosis, complications in past including ascites, thrombocytopenia now.  [facility] discharged on prednisolone for 28 days. --AST improved.  ALT within normal limits.  Total bilirubin significantly better (5.3) compared to October 20 (21.7). --Continue sodium 2 g diet, 1.5 L fluid restriction, lactulose.  Thrombocytopenia, stable.  Likely related to liver disease.  Follow-up as an outpatient.  Hypokalemia, hypophosphatemia --Potassium within normal limits.  Phosphorus repletion today.  Magnesium within normal limits.  Macrocytic anemia --Stable.  Hemoglobin 11/4 was 9.3 in care everywhere.  Platelets were 124.  [facility] for [facility] level [facility] --Has follow-up with [facility] December 2.  Significant Hospital Events    11/26 admitted for hematemesis  Consults:   Gastroenterology  Procedures:   EGD showing portal hypertensive gastropathy  Significant Diagnostic Tests:     Micro Data:   SARS-CoV-2 negative  Today's assessment: S: Feels well, no significant pain.  No nausea or vomiting.  Just ate a hamburger for lunch.  No bleeding noted. O: Vitals:  Vitals:   10/19/19 1137 10/19/19 1159  BP: 113/84 107/80  Pulse: 97 (!) 101  Resp: 16   Temp: 98.8 F (37.1 C) 98.5 F (36.9 C)  SpO2: 99% 99%    Constitutional:  Appears calm, comfortable. Respiratory.  Clear to auscultation bilaterally.  No wheezes, rales or rhonchi.  Normal respiratory effort. Cardiovascular.  Regular rate and rhythm.  No murmur, rub or gallop.  No lower extremity edema. Abdomen.  Soft. Psychiatric.  Grossly  normal mood and affect.  Speech fluent and appropriate.  BMP unremarkable.  Phosphorus 2.1.  Magnesium 2.5. AST 65 and trending down.  ALT within normal limits.  Total bilirubin stable at 5.3. Hemoglobin stable at 9.4. Platelets 101 and stable.   Discharge Instructions  Discharge Instructions    Diet - low sodium heart healthy   Complete by: As directed    Discharge instructions   Complete by: As directed    Call your physician or seek immediate medical attention for bleeding, pain, swelling, shortness of breath, uncontrolled vomiting or worsening of condition.   Increase activity slowly   Complete by: As directed    Nursing communication   Complete by: As directed    Can d/c home at 1640 if no n/v after lunch.     Allergies as of 10/19/2019   No Known Allergies     Medication List    TAKE these medications   ferrous sulfate 325 (65 FE) MG tablet Take 325 mg by mouth daily with breakfast.   folic acid 1 MG tablet Commonly known as: FOLVITE Take 1 mg by mouth daily.   Multivitamin Women Tabs Take 1 tablet by mouth daily.   nadolol 20 MG tablet Commonly known as: Corgard Take 1 tablet (20 mg total) by mouth daily.   omeprazole 40 MG capsule Commonly known as: PriLOSEC Take 1 capsule (40 mg total) by mouth daily.   thiamine 100 MG tablet Commonly known as: VITAMIN B-1 Take 100 mg by mouth daily.   Vitamin D-1000 Max St 25 MCG (1000 UT) tablet Generic drug: Cholecalciferol Take 1,000 mg by mouth daily.      No Known Allergies  The results of significant diagnostics from this hospitalization (including imaging, microbiology, ancillary and laboratory) are listed below for reference.    Significant Diagnostic Studies: No results found.  Microbiology: Recent Results (from the past 240 hour(s))  SARS CORONAVIRUS 2 (TAT 6-24 HRS) Nasopharyngeal Nasopharyngeal Swab     Status: None   Collection Time: 10/18/19  7:57 AM   Specimen: Nasopharyngeal Swab  Result  Value Ref Range Status   SARS Coronavirus 2 NEGATIVE NEGATIVE Final    Comment: (NOTE) SARS-CoV-2 target nucleic acids are NOT DETECTED. The SARS-CoV-2 RNA is generally detectable in upper and lower respiratory specimens during the acute phase of infection. Negative results do not preclude SARS-CoV-2 infection, do not rule out co-infections with other pathogens, and should not be used as the sole basis for treatment or other patient management decisions. Negative results must be combined with clinical observations, patient history, and epidemiological information. The expected result is Negative. Fact Sheet for Patients: SugarRoll.be Fact Sheet for Healthcare Providers: https://www.woods-mathews.com/ This test is not yet approved or cleared by the [place] FDA and  has been authorized for detection and/or diagnosis of SARS-CoV-2 by FDA under an Emergency Use Authorization (EUA). This EUA will remain  in effect (meaning this test can be used) for the duration of the COVID-19 declaration under Section 56 4(b)(1) of the Act, 21 U.S.C. section 360bbb-3(b)(1), unless the authorization is terminated or revoked sooner. Performed at [facility], [place] [address], [place], [place] [address]      Labs: Basic Metabolic Panel: Recent Labs  Lab 10/18/19 6213 10/18/19 1246 10/19/19 0639  NA 134*  --  138  K 2.5*  --  3.7  CL 96*  --  104  CO2 24  --  24  GLUCOSE 136*  --  115*  BUN <5*  --  <  5*  CREATININE 0.57  --  0.46  CALCIUM 8.6*  --  8.3*  MG  --  1.7 2.5*  PHOS  --  2.2* 2.1*   Liver Function Tests: Recent Labs  Lab 10/18/19 0649 10/19/19 0639  AST 59* 65*  ALT 45* 41  ALKPHOS 148* 130*  BILITOT 5.7* 5.3*  PROT 7.5 7.0  ALBUMIN 3.9 3.6   CBC: Recent Labs  Lab 10/18/19 0649 10/19/19 0639  WBC 5.4 4.2  HGB 10.4* 9.4*  HCT 29.8* 28.6*  MCV 102.1* 104.4*  PLT 86* 101*    Principal Problem:   Decompensated  hepatic cirrhosis (HCC) Active Problems:   Hypokalemia   Coagulopathy (HCC)   Total bilirubin, elevated   Hematemesis   Time coordinating discharge: 35 minutes  Signed:  Brendia Sacks, MD  [facility]  10/19/2019, 2:50 PM

## 2019-10-19 NOTE — Progress Notes (Signed)
MD ordered patient to be discharged home.  Discharge instructions were reviewed with the patient and she voiced understanding.  Follow-up appointment was already made.  Prescriptions sent to the patients pharmacy  IV and midline were removed with catheter intact.  All patients questions were answered.  Patient drove herself to the hospital and she did not have anyone that could pick her up so she was insistent on driving herself home.  She left via wheelchair escorted by NT.

## 2019-10-19 NOTE — Anesthesia Preprocedure Evaluation (Signed)
Anesthesia Evaluation  Patient identified by MRN, date of birth, ID band Patient awake    Reviewed: Allergy & Precautions, H&P , NPO status , Patient's Chart, lab work & pertinent test results  History of Anesthesia Complications Negative for: history of anesthetic complications  Airway Mallampati: III  TM Distance: <3 FB Neck ROM: full    Dental  (+) Chipped, Poor Dentition   Pulmonary neg shortness of breath, Current Smoker,           Cardiovascular Exercise Tolerance: Good (-) angina(-) Past MI and (-) Peripheral Vascular Disease negative cardio ROS       Neuro/Psych negative neurological ROS  negative psych ROS   GI/Hepatic negative GI ROS, Neg liver ROS, neg GERD  ,  Endo/Other  negative endocrine ROS  Renal/GU negative Renal ROS  negative genitourinary   Musculoskeletal   Abdominal   Peds  Hematology negative hematology ROS (+)   Anesthesia Other Findings Patient is NPO appropriate and reports no nausea or vomiting today.  Past Medical History: 2010: Blood transfusion without reported diagnosis     Comment:  misscarriage No date: Hx of incompetent cervix, currently pregnant No date: Medical history non-contributory  Past Surgical History: No date: CERVICAL CERCLAGE     Comment:  x4  BMI    Body Mass Index: 32.11 kg/m      Reproductive/Obstetrics negative OB ROS                             Anesthesia Physical Anesthesia Plan  ASA: II  Anesthesia Plan: General   Post-op Pain Management:    Induction: Intravenous  PONV Risk Score and Plan: Propofol infusion and TIVA  Airway Management Planned: Natural Airway and Nasal Cannula  Additional Equipment:   Intra-op Plan:   Post-operative Plan:   Informed Consent: I have reviewed the patients History and Physical, chart, labs and discussed the procedure including the risks, benefits and alternatives for the  proposed anesthesia with the patient or authorized representative who has indicated his/her understanding and acceptance.     Dental Advisory Given  Plan Discussed with: Anesthesiologist, CRNA and Surgeon  Anesthesia Plan Comments: (Patient consented for risks of anesthesia including but not limited to:  - adverse reactions to medications - risk of intubation if required - damage to teeth, lips or other oral mucosa - sore throat or hoarseness - Damage to heart, brain, lungs or loss of life  Patient voiced understanding.)        Anesthesia Quick Evaluation

## 2019-10-22 ENCOUNTER — Encounter: Payer: Self-pay | Admitting: Gastroenterology

## 2020-03-03 ENCOUNTER — Other Ambulatory Visit: Payer: Self-pay

## 2020-05-26 ENCOUNTER — Other Ambulatory Visit: Payer: Self-pay

## 2020-05-26 ENCOUNTER — Emergency Department: Payer: Medicaid Other

## 2020-05-26 ENCOUNTER — Inpatient Hospital Stay
Admission: EM | Admit: 2020-05-26 | Discharge: 2020-06-01 | DRG: 372 | Disposition: A | Discharge status: Home / Self Care | Payer: Medicaid Other | Attending: Internal Medicine | Admitting: Internal Medicine

## 2020-05-26 DIAGNOSIS — R609 Edema, unspecified: Secondary | ICD-10-CM

## 2020-05-26 DIAGNOSIS — D6959 Other secondary thrombocytopenia: Secondary | ICD-10-CM | POA: Diagnosis present

## 2020-05-26 DIAGNOSIS — K7031 Alcoholic cirrhosis of liver with ascites: Secondary | ICD-10-CM | POA: Diagnosis present

## 2020-05-26 DIAGNOSIS — I8510 Secondary esophageal varices without bleeding: Secondary | ICD-10-CM | POA: Diagnosis present

## 2020-05-26 DIAGNOSIS — R17 Unspecified jaundice: Secondary | ICD-10-CM

## 2020-05-26 DIAGNOSIS — E8770 Fluid overload, unspecified: Secondary | ICD-10-CM | POA: Diagnosis not present

## 2020-05-26 DIAGNOSIS — E669 Obesity, unspecified: Secondary | ICD-10-CM | POA: Diagnosis present

## 2020-05-26 DIAGNOSIS — N179 Acute kidney failure, unspecified: Secondary | ICD-10-CM | POA: Diagnosis present

## 2020-05-26 DIAGNOSIS — D649 Anemia, unspecified: Secondary | ICD-10-CM | POA: Diagnosis present

## 2020-05-26 DIAGNOSIS — E876 Hypokalemia: Secondary | ICD-10-CM | POA: Diagnosis present

## 2020-05-26 DIAGNOSIS — K766 Portal hypertension: Secondary | ICD-10-CM | POA: Diagnosis present

## 2020-05-26 DIAGNOSIS — I959 Hypotension, unspecified: Secondary | ICD-10-CM | POA: Diagnosis not present

## 2020-05-26 DIAGNOSIS — Z818 Family history of other mental and behavioral disorders: Secondary | ICD-10-CM

## 2020-05-26 DIAGNOSIS — K3189 Other diseases of stomach and duodenum: Secondary | ICD-10-CM | POA: Diagnosis present

## 2020-05-26 DIAGNOSIS — K652 Spontaneous bacterial peritonitis: Secondary | ICD-10-CM | POA: Diagnosis present

## 2020-05-26 DIAGNOSIS — Z20822 Contact with and (suspected) exposure to covid-19: Secondary | ICD-10-CM | POA: Diagnosis present

## 2020-05-26 DIAGNOSIS — F17210 Nicotine dependence, cigarettes, uncomplicated: Secondary | ICD-10-CM | POA: Diagnosis present

## 2020-05-26 DIAGNOSIS — K8020 Calculus of gallbladder without cholecystitis without obstruction: Secondary | ICD-10-CM

## 2020-05-26 DIAGNOSIS — R188 Other ascites: Secondary | ICD-10-CM

## 2020-05-26 DIAGNOSIS — Z6834 Body mass index (BMI) 34.0-34.9, adult: Secondary | ICD-10-CM

## 2020-05-26 DIAGNOSIS — E871 Hypo-osmolality and hyponatremia: Secondary | ICD-10-CM | POA: Diagnosis present

## 2020-05-26 DIAGNOSIS — R1084 Generalized abdominal pain: Secondary | ICD-10-CM

## 2020-05-26 LAB — COMPREHENSIVE METABOLIC PANEL
ALT: 29 U/L (ref 0–44)
AST: 86 U/L — ABNORMAL HIGH (ref 15–41)
Albumin: 2.2 g/dL — ABNORMAL LOW (ref 3.5–5.0)
Alkaline Phosphatase: 175 U/L — ABNORMAL HIGH (ref 38–126)
Anion gap: 17 — ABNORMAL HIGH (ref 5–15)
BUN: 21 mg/dL — ABNORMAL HIGH (ref 6–20)
CO2: 29 mmol/L (ref 22–32)
Calcium: 8.4 mg/dL — ABNORMAL LOW (ref 8.9–10.3)
Chloride: 73 mmol/L — ABNORMAL LOW (ref 98–111)
Creatinine, Ser: UNDETERMINED mg/dL (ref 0.44–1.00)
Glucose, Bld: 122 mg/dL — ABNORMAL HIGH (ref 70–99)
Potassium: <2 mmol/L — CL (ref 3.5–5.1)
Sodium: 119 mmol/L — CL (ref 135–145)
Total Bilirubin: 27.3 mg/dL (ref 0.3–1.2)
Total Protein: 6.4 g/dL — ABNORMAL LOW (ref 6.5–8.1)

## 2020-05-26 LAB — CBC
HCT: 24.5 % — ABNORMAL LOW (ref 36.0–46.0)
Hemoglobin: 9.1 g/dL — ABNORMAL LOW (ref 12.0–15.0)
MCH: 36.1 pg — ABNORMAL HIGH (ref 26.0–34.0)
MCHC: 37.1 g/dL — ABNORMAL HIGH (ref 30.0–36.0)
MCV: 97.2 fL (ref 80.0–100.0)
Platelets: 90 10*3/uL — ABNORMAL LOW (ref 150–400)
RBC: 2.52 MIL/uL — ABNORMAL LOW (ref 3.87–5.11)
RDW: 15.4 % (ref 11.5–15.5)
WBC: 21.6 10*3/uL — ABNORMAL HIGH (ref 4.0–10.5)
nRBC: 0 % (ref 0.0–0.2)

## 2020-05-26 LAB — LIPASE, BLOOD: Lipase: 33 U/L (ref 11–51)

## 2020-05-26 MED ORDER — IOHEXOL 9 MG/ML PO SOLN
500.0000 mL | Freq: Two times a day (BID) | ORAL | Status: DC | PRN
  Administered 2020-05-26 (×2): 500 mL via ORAL

## 2020-05-26 MED ORDER — SODIUM CHLORIDE 0.9 % IV BOLUS
1000.0000 mL | Freq: Once | INTRAVENOUS | Status: AC
  Administered 2020-05-26: 1000 mL via INTRAVENOUS

## 2020-05-26 MED ORDER — PIPERACILLIN-TAZOBACTAM 3.375 G IVPB 30 MIN
3.3750 g | Freq: Once | INTRAVENOUS | Status: AC
  Administered 2020-05-27: 3.375 g via INTRAVENOUS
  Filled 2020-05-26: qty 50

## 2020-05-26 MED ORDER — MORPHINE SULFATE (PF) 4 MG/ML IV SOLN
4.0000 mg | Freq: Once | INTRAVENOUS | Status: AC
  Administered 2020-05-26: 4 mg via INTRAVENOUS
  Filled 2020-05-26: qty 1

## 2020-05-26 MED ORDER — ONDANSETRON HCL 4 MG/2ML IJ SOLN
4.0000 mg | Freq: Once | INTRAMUSCULAR | Status: AC
  Administered 2020-05-26: 4 mg via INTRAVENOUS
  Filled 2020-05-26: qty 2

## 2020-05-26 NOTE — ED Provider Notes (Signed)
[facility] Emergency Department Provider Note   ____________________________________________   First MD Initiated Contact with Patient 05/26/20 2328     (approximate)  I have reviewed the triage vital signs and the nursing notes.   HISTORY  Chief Complaint Abdominal Pain    HPI Lindsey Huang is a 39 y.o. female who presents to the ED from home with a chief complaint of generalized abdominal pain and nausea.  Patient has a history of alcoholic cirrhosis and reports abdominal distention resulting in shortness of breath.  Has had paracentesis previously and feels like she needs it again.  Denies active EtOH.  Has noted yellowing of her skin.  Denies fever, chills, cough, chest pain, shortness of breath, vomiting, dysuria, diarrhea.  Denies recent travel or trauma.       Past Medical History:  Diagnosis Date   Blood transfusion without reported diagnosis 2010   misscarriage   Cirrhosis (HCC)    Hx of incompetent cervix, currently pregnant    Medical history non-contributory    Portal hypertensive gastropathy (HCC)    Thrombocytopenia (HCC)     Patient Active Problem List   Diagnosis Date Noted   Decompensated hepatic cirrhosis (HCC) 10/18/2019   Hypokalemia 10/18/2019   Coagulopathy (HCC) 10/18/2019   Total bilirubin, elevated 10/18/2019   Hematemesis 10/18/2019   Liver failure (HCC) 09/11/2019   Indication for care in labor or delivery 12/25/2013   NVD (normal vaginal delivery) 12/25/2013    Past Surgical History:  Procedure Laterality Date   CERVICAL CERCLAGE     x4   ESOPHAGOGASTRODUODENOSCOPY (EGD) WITH PROPOFOL N/A 10/19/2019   Procedure: ESOPHAGOGASTRODUODENOSCOPY (EGD) WITH PROPOFOL;  Surgeon: Wyline Mood, MD;  Location: [facility] ENDOSCOPY;  Service: Gastroenterology;  Laterality: N/A;    Prior to Admission medications   Medication Sig Start Date End Date Taking? Authorizing Provider  ferrous sulfate 325 (65 FE) MG  tablet Take 325 mg by mouth daily with breakfast.    [provider]  folic acid (FOLVITE) 1 MG tablet Take 1 mg by mouth daily. 09/22/19 09/21/20  [provider]  Multiple Vitamins-Minerals (MULTIVITAMIN WOMEN) TABS Take 1 tablet by mouth daily.    [provider]  nadolol (CORGARD) 20 MG tablet Take 1 tablet (20 mg total) by mouth daily. 10/19/19 11/18/19  Standley Brooking, MD  omeprazole (PRILOSEC) 40 MG capsule Take 1 capsule (40 mg total) by mouth daily. 10/19/19 12/18/19  Standley Brooking, MD  thiamine (VITAMIN B-1) 100 MG tablet Take 100 mg by mouth daily. 09/26/19 09/25/20  [provider]    Allergies Patient has no known allergies.  Family History  Problem Relation Age of Onset   Depression Maternal Aunt    Alcohol abuse Neg Hx    Arthritis Neg Hx    Asthma Neg Hx    Birth defects Neg Hx    Cancer Neg Hx    COPD Neg Hx    Drug abuse Neg Hx    Early death Neg Hx    Diabetes Neg Hx    Hearing loss Neg Hx    Heart disease Neg Hx    Hyperlipidemia Neg Hx    Hypertension Neg Hx    Kidney disease Neg Hx    Learning disabilities Neg Hx    Mental illness Neg Hx    Mental retardation Neg Hx    Miscarriages / Stillbirths Neg Hx    Stroke Neg Hx    Vision loss Neg Hx  Social History Social History   Tobacco Use   Smoking status: Current Every Day Smoker    Types: Cigarettes    Last attempt to quit: 07/12/2013    Years since quitting: 6.8   Smokeless tobacco: Never Used  Vaping Use   Vaping Use: Never assessed  Substance Use Topics   Alcohol use: Yes    Comment: occ   Drug use: No    Review of Systems  Constitutional: No fever/chills Eyes: No visual changes. ENT: No sore throat. Cardiovascular: Denies chest pain. Respiratory: Denies shortness of breath. Gastrointestinal: Positive for abdominal pain and nausea, no vomiting.  No diarrhea.  No constipation. Genitourinary: Negative for  dysuria. Musculoskeletal: Negative for back pain. Skin: Positive for jaundice.  Negative for rash. Neurological: Negative for headaches, focal weakness or numbness.   ____________________________________________   PHYSICAL EXAM:  VITAL SIGNS: ED Triage Vitals  Enc Vitals Group     BP 05/26/20 2124 (!) 98/55     Pulse Rate 05/26/20 2124 94     Resp 05/26/20 2124 18     Temp 05/26/20 2124 98.3 F (36.8 C)     Temp Source 05/26/20 2124 Oral     SpO2 05/26/20 2124 99 %     Weight 05/26/20 2124 132 lb (59.9 kg)     Height 05/26/20 2124 4\' 11"  (1.499 m)     Head Circumference --      Peak Flow --      Pain Score 05/26/20 2130 10     Pain Loc --      Pain Edu? --      Excl. in GC? --     Constitutional: Alert and oriented.  Chronically ill appearing and in mild acute distress. Eyes: Conjunctivae are normal. PERRL. EOMI. Head: Atraumatic. Nose: No congestion/rhinnorhea. Mouth/Throat: Mucous membranes are moist.  Oropharynx non-erythematous. Neck: No stridor.   Cardiovascular: Normal rate, regular rhythm. Grossly normal heart sounds.  Good peripheral circulation. Respiratory: Normal respiratory effort.  No retractions. Lungs CTAB. Gastrointestinal: Soft with diffuse tenderness to palpation without rebound or guarding.  Hepatomegaly.  Moderate distention. No abdominal bruits. No CVA tenderness. Musculoskeletal: No lower extremity tenderness nor edema.  No joint effusions. Neurologic:  Normal speech and language. No gross focal neurologic deficits are appreciated.  Skin:  Skin is warm, dry and intact. No rash noted.  No petechiae.   Psychiatric: Mood and affect are normal. Speech and behavior are normal.  ____________________________________________   LABS (all labs ordered are listed, but only abnormal results are displayed)  Labs Reviewed  COMPREHENSIVE METABOLIC PANEL - Abnormal; Notable for the following components:      Result Value   Sodium 119 (*)    Potassium <2.0  (*)    Chloride 73 (*)    Glucose, Bld 122 (*)    BUN 21 (*)    Calcium 8.4 (*)    Total Protein 6.4 (*)    Albumin 2.2 (*)    AST 86 (*)    Alkaline Phosphatase 175 (*)    Total Bilirubin 27.3 (*)    Anion gap 17 (*)    All other components within normal limits  CBC - Abnormal; Notable for the following components:   WBC 21.6 (*)    RBC 2.52 (*)    Hemoglobin 9.1 (*)    HCT 24.5 (*)    MCH 36.1 (*)    MCHC 37.1 (*)    Platelets 90 (*)    All other components within normal limits  PROTIME-INR - Abnormal; Notable for the following components:   Prothrombin Time 30.2 (*)    INR 3.0 (*)    All other components within normal limits  LACTIC ACID, PLASMA - Abnormal; Notable for the following components:   Lactic Acid, Venous 2.3 (*)    All other components within normal limits  COMPREHENSIVE METABOLIC PANEL - Abnormal; Notable for the following components:   Sodium 117 (*)    Potassium <2.0 (*)    Chloride 74 (*)    Glucose, Bld 137 (*)    BUN 23 (*)    Calcium 7.9 (*)    Total Protein 5.9 (*)    Albumin 2.1 (*)    AST 78 (*)    Alkaline Phosphatase 151 (*)    Total Bilirubin 29.6 (*)    Anion gap 16 (*)    All other components within normal limits  CBC WITH DIFFERENTIAL/PLATELET - Abnormal; Notable for the following components:   WBC 21.2 (*)    RBC 2.53 (*)    Hemoglobin 9.2 (*)    HCT 24.8 (*)    MCH 36.4 (*)    MCHC 37.1 (*)    RDW 15.6 (*)    Platelets 84 (*)    Neutro Abs 18.3 (*)    Monocytes Absolute 1.2 (*)    Abs Immature Granulocytes 0.79 (*)    All other components within normal limits  SARS CORONAVIRUS 2 BY RT PCR (HOSPITAL ORDER, PERFORMED IN [facility])  CULTURE, BLOOD (ROUTINE X 2)  CULTURE, BLOOD (ROUTINE X 2)  LIPASE, BLOOD  URINALYSIS, COMPLETE (UACMP) WITH MICROSCOPIC  LACTIC ACID, PLASMA  COMPREHENSIVE METABOLIC PANEL  POC URINE PREG, ED   ____________________________________________  EKG  ED ECG REPORT I, Keandra Medero J,  the attending physician, personally viewed and interpreted this ECG.   Date: 05/26/2020  EKG Time: 2319  Rate: 90  Rhythm: normal EKG, normal sinus rhythm  Axis: Normal  Intervals:none  ST&T Change: Nonspecific  ____________________________________________  RADIOLOGY  ED MD interpretation: No acute cardiopulmonary process; CT consistent with changes of cirrhosis, splenomegaly, ascites and varices, cholelithiasis; ultrasound demonstrates gallstone, sludge, gallbladder wall thickening  Official radiology report(s): CT Abdomen Pelvis W Contrast  Result Date: 05/27/2020 CLINICAL DATA:  Abdominal pain for several days with nausea, initial encounter EXAM: CT ABDOMEN AND PELVIS WITH CONTRAST TECHNIQUE: Multidetector CT imaging of the abdomen and pelvis was performed using the standard protocol following bolus administration of intravenous contrast. CONTRAST:  49mL OMNIPAQUE IOHEXOL 300 MG/ML  SOLN COMPARISON:  None. FINDINGS: Lower chest: No acute abnormality. Hepatobiliary: Some patchy areas of decreased attenuation are noted within the liver. Underlying metastatic disease could not be totally excluded. These changes could be related to underlying cirrhosis. Recanalization of the umbilical vein is noted. Generalized ascites is seen. Gallbladder demonstrates dependent density consistent with small stones. Pancreas: Pancreas is within normal limits. Spleen: Spleen is enlarged without focal mass consistent with underlying portal hypertension. Adrenals/Urinary Tract: Adrenal glands are within normal limits. Kidneys demonstrate a normal enhancement pattern. No calculi are seen. Delayed images demonstrate no significant excretion of contrast which may be related to some underlying renal failure. Stomach/Bowel: Colon is well visualized. Diverticular changes noted without diverticulitis. No obstructive or inflammatory changes are seen. The appendix is not well visualized. Small bowel is unremarkable. Stomach is  within normal limits. 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 Vascular/Lymphatic: Aortic atherosclerosis. No enlarged abdominal or pelvic lymph nodes. Reproductive: Uterus is within normal limits.  Prominence of the ovarian veins is seen consistent with some pelvic varices. No adnexal mass is noted. Other: Moderate ascites is seen consistent with the underlying cirrhosis. Musculoskeletal: No acute or significant osseous findings. IMPRESSION: Changes consistent with cirrhosis of the liver with portal hypertension. Associated splenomegaly, ascites and varices are noted. Multiple areas of decreased attenuation within the liver incompletely evaluated on this exam. These may represent changes of cirrhosis and regenerating nodules. Underlying neoplasm could not be totally excluded. Cholelithiasis. Diverticulosis without diverticulitis. Electronically Signed   By: Alcide Clever M.D.   On: 05/27/2020 02:20   DG Chest Port 1 View  Result Date: 05/27/2020 CLINICAL DATA:  Shortness of breath EXAM: PORTABLE CHEST 1 VIEW COMPARISON:  09/10/2019 FINDINGS: The heart size and mediastinal contours are within normal limits. Both lungs are clear. The visualized skeletal structures are unremarkable. IMPRESSION: No active disease. Electronically Signed   By: Alcide Clever M.D.   On: 05/27/2020 00:13   US ABDOMEN LIMITED RUQ  Result Date: 05/27/2020 CLINICAL DATA:  Cirrhosis.  Ascites.  Cholelithiasis. EXAM: ULTRASOUND ABDOMEN LIMITED RIGHT UPPER QUADRANT COMPARISON:  CT 04/27/2020. FINDINGS: Gallbladder: 4.5 mm gallstone noted. Prominent amount of sludge noted. Prominent gallbladder wall thickening to 7.5 mm. This could be from hypoproteinemia and or cholecystitis. Common bile duct: Diameter: 3.6 mm Liver: Heterogeneous nodular parenchymal pattern consistent with cirrhosis. No mass noted. Reversal of flow is noted in the portal vein. A portion of the portal vein appears to be thrombosed.  The umbilical vein appears to be recanalized. Other: Ascites. IMPRESSION: 1. 4.5 mm gallstone. Prominent amount of sludge. Gallbladder wall thickening to 7.5 mm. This could be from hypoproteinemia and or cholecystitis. No biliary distention. 2. Heterogeneous nodular hepatic parenchymal pattern consistent with cirrhosis. No hepatic mass noted. 3. Reversal of flow is noted in the portal vein. A portion of the portal vein appears to be thrombosed. The umbilical vein appears to be recanalized. 4.  Ascites. Electronically Signed   By: Maisie Fus  Register   On: 05/27/2020 06:15    ____________________________________________   PROCEDURES  Procedure(s) performed (including Critical Care):  .1-3 Lead EKG Interpretation Performed by: Irean Hong, MD Authorized by: Irean Hong, MD     Interpretation: normal     ECG rate:  82   ECG rate assessment: normal     Rhythm: sinus rhythm     Ectopy: none     Conduction: normal   Comments:     Patient placed on cardiac monitor to evaluate for arrhythmias    CRITICAL CARE Performed by: Irean Hong   Total critical care time: 45 minutes  Critical care time was exclusive of separately billable procedures and treating other patients.  Critical care was necessary to treat or prevent imminent or life-threatening deterioration.  Critical care was time spent personally by me on the following activities: development of treatment plan with patient and/or surrogate as well as nursing, discussions with consultants, evaluation of patient's response to treatment, examination of patient, obtaining history from patient or surrogate, ordering and performing treatments and interventions, ordering and review of laboratory studies, ordering and review of radiographic studies, pulse oximetry and re-evaluation of patient's condition.  ____________________________________________   INITIAL IMPRESSION / ASSESSMENT AND PLAN / ED COURSE  As part of my medical decision  making, I reviewed the following data within the electronic MEDICAL RECORD NUMBER Nursing notes reviewed and incorporated, Labs reviewed, EKG interpreted, Old chart reviewed, Radiograph reviewed and Notes from prior ED visits  Lindsey Huang was evaluated in Emergency Department on 05/27/2020 for the symptoms described in the history of present illness. She was evaluated in the context of the global COVID-19 pandemic, which necessitated consideration that the patient might be at risk for infection with the SARS-CoV-2 virus that causes COVID-19. Institutional protocols and algorithms that pertain to the evaluation of patients at risk for COVID-19 are in a state of rapid change based on information released by regulatory bodies including the CDC and federal and state organizations. These policies and algorithms were followed during the patient's care in the ED.    39 year old female with alcoholic cirrhosis presenting with abdominal pain and nausea. Differential diagnosis includes, but is not limited to, biliary disease (biliary colic, acute cholecystitis, cholangitis, choledocholithiasis, etc), intrathoracic causes for epigastric abdominal pain including ACS, gastritis, duodenitis, pancreatitis, small bowel or large bowel obstruction, abdominal aortic aneurysm, hernia, and ulcer(s).  Patient is jaundiced, with leukocytosis, hyponatremia, hypokalemia with bilirubin 27.3.  Obvious ascites on clinical exam.  Will repeat metabolic panel although I suspect these are accurate numbers.  Replete IV potassium.  Obtain CT abdomen/pelvis.  Initiate broad-spectrum IV antibiotics.   Clinical Course as of May 27 699  Tue May 27, 2020  0230 Spoke with [facility] transfer center and Dr. Kalman Drape; no stepdown beds available.  She will be added to the wait list and they will call once a bed becomes available. No formal acceptance until [facility] calls back with available bed.   [JS]  0250 Updated patient on all test results.  We had a  lengthy discussion because patient just wanted a paracentesis and to be discharged home.  I strongly recommended transfer given her electrolyte imbalance and coagulopathy in addition to her decompensated cirrhosis.  Ultimately patient is agreeable and requests transfer to [facility] as this is closer to where she lives.  Will contact transfer center.   [JS]  [id number] Patient now also wishes for me to contact [facility] to get on the waitlist there.  [facility] transfer center called.   [JS]  [id number] No callback yet from [facility]. Will recheck metabolic panel after completion of IV potassium.   [JS]  [phone] Discussed with [facility] hospitalist Dr. Dimas Aguas who suggests diagnostic and therapeutic paracentesis prior to transfer if possible.  Before formally accepting the patient, he does recommend repeating metabolic panel.  This is already been ordered.  He does advise vitamin K 10 mg IV.  Will try to obtain IR paracentesis.   [JS]  0659 At this time care is transferred to Dr. Darnelle Catalan at change of shift.  Patient is pending repeat metabolic panel, IR paracentesis.  Would call back both [facility] as well as [facility] with most recent electrolytes for formal acceptance to their waiting list for transfer.   [JS]    Clinical Course User Index [JS] Irean Hong, MD     ____________________________________________   FINAL CLINICAL IMPRESSION(S) / ED DIAGNOSES  Final diagnoses:  Generalized abdominal pain  Cirrhosis of liver with ascites, unspecified hepatic cirrhosis type (HCC)  Jaundice  Hyponatremia  Hypokalemia  AKI (acute kidney injury) (HCC)  Swelling  Ascites     ED Discharge Orders    None       Note:  This document was prepared using Dragon voice recognition software and may include unintentional dictation errors.   Irean Hong, MD 05/27/20 0700

## 2020-05-26 NOTE — ED Triage Notes (Signed)
Pt arrives to ED via POV from home with c/o abdominal pain since last Friday. Pt reports generalized abdominal pain; (+) nausea, but denies V/D. Pt denies CP, but reports (+) SHOB. Pt reports h/x of liver issues, and pt's skin appears jaundiced. Pt is A&O, in NAD; RR even, regular, and unlabored.

## 2020-05-27 ENCOUNTER — Emergency Department: Payer: Medicaid Other

## 2020-05-27 ENCOUNTER — Encounter: Payer: Self-pay | Admitting: Radiology

## 2020-05-27 DIAGNOSIS — Z20822 Contact with and (suspected) exposure to covid-19: Secondary | ICD-10-CM | POA: Diagnosis present

## 2020-05-27 DIAGNOSIS — E871 Hypo-osmolality and hyponatremia: Secondary | ICD-10-CM | POA: Diagnosis present

## 2020-05-27 DIAGNOSIS — E669 Obesity, unspecified: Secondary | ICD-10-CM | POA: Diagnosis present

## 2020-05-27 DIAGNOSIS — E876 Hypokalemia: Secondary | ICD-10-CM

## 2020-05-27 DIAGNOSIS — K7031 Alcoholic cirrhosis of liver with ascites: Secondary | ICD-10-CM | POA: Diagnosis present

## 2020-05-27 DIAGNOSIS — Z6834 Body mass index (BMI) 34.0-34.9, adult: Secondary | ICD-10-CM | POA: Diagnosis not present

## 2020-05-27 DIAGNOSIS — K766 Portal hypertension: Secondary | ICD-10-CM | POA: Diagnosis present

## 2020-05-27 DIAGNOSIS — D649 Anemia, unspecified: Secondary | ICD-10-CM

## 2020-05-27 DIAGNOSIS — K3189 Other diseases of stomach and duodenum: Secondary | ICD-10-CM | POA: Diagnosis present

## 2020-05-27 DIAGNOSIS — I8510 Secondary esophageal varices without bleeding: Secondary | ICD-10-CM | POA: Diagnosis present

## 2020-05-27 DIAGNOSIS — D6959 Other secondary thrombocytopenia: Secondary | ICD-10-CM | POA: Diagnosis present

## 2020-05-27 DIAGNOSIS — E8770 Fluid overload, unspecified: Secondary | ICD-10-CM | POA: Diagnosis not present

## 2020-05-27 DIAGNOSIS — I959 Hypotension, unspecified: Secondary | ICD-10-CM | POA: Diagnosis not present

## 2020-05-27 DIAGNOSIS — K652 Spontaneous bacterial peritonitis: Principal | ICD-10-CM

## 2020-05-27 DIAGNOSIS — N179 Acute kidney failure, unspecified: Secondary | ICD-10-CM | POA: Diagnosis present

## 2020-05-27 DIAGNOSIS — Z818 Family history of other mental and behavioral disorders: Secondary | ICD-10-CM | POA: Diagnosis not present

## 2020-05-27 DIAGNOSIS — F17210 Nicotine dependence, cigarettes, uncomplicated: Secondary | ICD-10-CM | POA: Diagnosis present

## 2020-05-27 LAB — CBC WITH DIFFERENTIAL/PLATELET
Abs Immature Granulocytes: 0.79 10*3/uL — ABNORMAL HIGH (ref 0.00–0.07)
Basophils Absolute: 0.1 10*3/uL (ref 0.0–0.1)
Basophils Relative: 1 %
Eosinophils Absolute: 0.1 10*3/uL (ref 0.0–0.5)
Eosinophils Relative: 0 %
HCT: 24.8 % — ABNORMAL LOW (ref 36.0–46.0)
Hemoglobin: 9.2 g/dL — ABNORMAL LOW (ref 12.0–15.0)
Immature Granulocytes: 4 %
Lymphocytes Relative: 4 %
Lymphs Abs: 0.8 10*3/uL (ref 0.7–4.0)
MCH: 36.4 pg — ABNORMAL HIGH (ref 26.0–34.0)
MCHC: 37.1 g/dL — ABNORMAL HIGH (ref 30.0–36.0)
MCV: 98 fL (ref 80.0–100.0)
Monocytes Absolute: 1.2 10*3/uL — ABNORMAL HIGH (ref 0.1–1.0)
Monocytes Relative: 6 %
Neutro Abs: 18.3 10*3/uL — ABNORMAL HIGH (ref 1.7–7.7)
Neutrophils Relative %: 85 %
Platelets: 84 10*3/uL — ABNORMAL LOW (ref 150–400)
RBC: 2.53 MIL/uL — ABNORMAL LOW (ref 3.87–5.11)
RDW: 15.6 % — ABNORMAL HIGH (ref 11.5–15.5)
WBC: 21.2 10*3/uL — ABNORMAL HIGH (ref 4.0–10.5)
nRBC: 0 % (ref 0.0–0.2)

## 2020-05-27 LAB — COMPREHENSIVE METABOLIC PANEL
ALT: 22 U/L (ref 0–44)
ALT: 22 U/L (ref 0–44)
AST: 78 U/L — ABNORMAL HIGH (ref 15–41)
AST: 64 U/L — ABNORMAL HIGH (ref 15–41)
Albumin: 2.1 g/dL — ABNORMAL LOW (ref 3.5–5.0)
Albumin: 2 g/dL — ABNORMAL LOW (ref 3.5–5.0)
Alkaline Phosphatase: 151 U/L — ABNORMAL HIGH (ref 38–126)
Alkaline Phosphatase: 131 U/L — ABNORMAL HIGH (ref 38–126)
Anion gap: 16 — ABNORMAL HIGH (ref 5–15)
Anion gap: 15 (ref 5–15)
BUN: 23 mg/dL — ABNORMAL HIGH (ref 6–20)
BUN: 24 mg/dL — ABNORMAL HIGH (ref 6–20)
CO2: 27 mmol/L (ref 22–32)
CO2: 25 mmol/L (ref 22–32)
Calcium: 7.9 mg/dL — ABNORMAL LOW (ref 8.9–10.3)
Calcium: 7.4 mg/dL — ABNORMAL LOW (ref 8.9–10.3)
Chloride: 74 mmol/L — ABNORMAL LOW (ref 98–111)
Chloride: 77 mmol/L — ABNORMAL LOW (ref 98–111)
Creatinine, Ser: UNDETERMINED mg/dL (ref 0.44–1.00)
Creatinine, Ser: UNDETERMINED mg/dL (ref 0.44–1.00)
Glucose, Bld: 137 mg/dL — ABNORMAL HIGH (ref 70–99)
Glucose, Bld: 110 mg/dL — ABNORMAL HIGH (ref 70–99)
Potassium: <2 mmol/L — CL (ref 3.5–5.1)
Potassium: 2.2 mmol/L — CL (ref 3.5–5.1)
Sodium: 117 mmol/L — CL (ref 135–145)
Sodium: 117 mmol/L — CL (ref 135–145)
Total Bilirubin: 29.6 mg/dL (ref 0.3–1.2)
Total Bilirubin: 28.4 mg/dL (ref 0.3–1.2)
Total Protein: 5.9 g/dL — ABNORMAL LOW (ref 6.5–8.1)
Total Protein: 5.3 g/dL — ABNORMAL LOW (ref 6.5–8.1)

## 2020-05-27 LAB — OSMOLALITY, URINE: Osmolality, Ur: 126 mOsm/kg — ABNORMAL LOW (ref 300–900)

## 2020-05-27 LAB — NA AND K (SODIUM & POTASSIUM), RAND UR
Potassium Urine: 12 mmol/L
Sodium, Ur: <10 mmol/L

## 2020-05-27 LAB — BODY FLUID CELL COUNT WITH DIFFERENTIAL
Eos, Fluid: 0 %
Lymphs, Fluid: 8 %
Monocyte-Macrophage-Serous Fluid: 4 %
Neutrophil Count, Fluid: 88 %
Total Nucleated Cell Count, Fluid: 17084 cu mm

## 2020-05-27 LAB — SARS CORONAVIRUS 2 BY RT PCR (HOSPITAL ORDER, PERFORMED IN ~~LOC~~ HOSPITAL LAB): SARS Coronavirus 2: NEGATIVE

## 2020-05-27 LAB — LACTIC ACID, PLASMA
Lactic Acid, Venous: 2.3 mmol/L (ref 0.5–1.9)
Lactic Acid, Venous: 2.6 mmol/L (ref 0.5–1.9)

## 2020-05-27 LAB — URINALYSIS, COMPLETE (UACMP) WITH MICROSCOPIC
Glucose, UA: NEGATIVE mg/dL
Ketones, ur: NEGATIVE mg/dL
Leukocytes,Ua: NEGATIVE
Nitrite: NEGATIVE
Protein, ur: NEGATIVE mg/dL
Specific Gravity, Urine: 1.016 (ref 1.005–1.030)
pH: 5 (ref 5.0–8.0)

## 2020-05-27 LAB — PROTEIN, PLEURAL OR PERITONEAL FLUID: Total protein, fluid: <3 g/dL

## 2020-05-27 LAB — PROTIME-INR
INR: 3 — ABNORMAL HIGH (ref 0.8–1.2)
Prothrombin Time: 30.2 seconds — ABNORMAL HIGH (ref 11.4–15.2)

## 2020-05-27 LAB — LACTATE DEHYDROGENASE, PLEURAL OR PERITONEAL FLUID: LD, Fluid: 74 U/L — ABNORMAL HIGH (ref 3–23)

## 2020-05-27 LAB — GLUCOSE, PLEURAL OR PERITONEAL FLUID: Glucose, Fluid: 99 mg/dL

## 2020-05-27 LAB — GRAM STAIN

## 2020-05-27 LAB — BASIC METABOLIC PANEL
Anion gap: 11 (ref 5–15)
BUN: 25 mg/dL — ABNORMAL HIGH (ref 6–20)
CO2: 25 mmol/L (ref 22–32)
Calcium: 7.4 mg/dL — ABNORMAL LOW (ref 8.9–10.3)
Chloride: 84 mmol/L — ABNORMAL LOW (ref 98–111)
Creatinine, Ser: UNDETERMINED mg/dL (ref 0.44–1.00)
Glucose, Bld: 135 mg/dL — ABNORMAL HIGH (ref 70–99)
Potassium: 2.5 mmol/L — CL (ref 3.5–5.1)
Sodium: 120 mmol/L — ABNORMAL LOW (ref 135–145)

## 2020-05-27 LAB — AMYLASE, PLEURAL OR PERITONEAL FLUID: Amylase, Fluid: 12 U/L

## 2020-05-27 LAB — ALBUMIN, PLEURAL OR PERITONEAL FLUID: Albumin, Fluid: <1 g/dL

## 2020-05-27 LAB — PATHOLOGIST SMEAR REVIEW

## 2020-05-27 LAB — MAGNESIUM: Magnesium: 2 mg/dL (ref 1.7–2.4)

## 2020-05-27 MED ORDER — HYDROMORPHONE HCL 1 MG/ML IJ SOLN: 0.5000 mg | INTRAMUSCULAR | Status: DC | PRN

## 2020-05-27 MED ORDER — SODIUM CHLORIDE 3 % IV BOLUS
100.0000 mL | Freq: Once | INTRAVENOUS | Status: AC
  Administered 2020-05-27: 100 mL via INTRAVENOUS
  Filled 2020-05-27 (×2): qty 100

## 2020-05-27 MED ORDER — POTASSIUM CHLORIDE 10 MEQ/100ML IV SOLN
10.0000 meq | Freq: Once | INTRAVENOUS | Status: AC
  Administered 2020-05-27: 10 meq via INTRAVENOUS
  Filled 2020-05-27: qty 100

## 2020-05-27 MED ORDER — ONDANSETRON HCL 4 MG PO TABS: 4.0000 mg | ORAL_TABLET | Freq: Four times a day (QID) | ORAL | Status: DC | PRN

## 2020-05-27 MED ORDER — POTASSIUM CHLORIDE 10 MEQ/100ML IV SOLN
INTRAVENOUS | Status: AC
  Administered 2020-05-27: 10 meq via INTRAVENOUS
  Filled 2020-05-27: qty 100

## 2020-05-27 MED ORDER — SODIUM CHLORIDE 0.9 % IV BOLUS
1000.0000 mL | Freq: Once | INTRAVENOUS | Status: AC
  Administered 2020-05-27: 1000 mL via INTRAVENOUS

## 2020-05-27 MED ORDER — ONDANSETRON HCL 4 MG/2ML IJ SOLN
4.0000 mg | Freq: Once | INTRAMUSCULAR | Status: AC
  Administered 2020-05-27: 4 mg via INTRAVENOUS
  Filled 2020-05-27: qty 2

## 2020-05-27 MED ORDER — POTASSIUM CHLORIDE 10 MEQ/100ML IV SOLN
10.0000 meq | Freq: Once | INTRAVENOUS | Status: AC
  Administered 2020-05-27: 10 meq via INTRAVENOUS

## 2020-05-27 MED ORDER — MORPHINE SULFATE (PF) 4 MG/ML IV SOLN
4.0000 mg | Freq: Once | INTRAVENOUS | Status: AC
  Administered 2020-05-27: 4 mg via INTRAVENOUS
  Filled 2020-05-27: qty 1

## 2020-05-27 MED ORDER — VITAMIN K1 10 MG/ML IJ SOLN
10.0000 mg | Freq: Every day | INTRAVENOUS | Status: AC
  Administered 2020-05-28 – 2020-05-30 (×3): 10 mg via INTRAVENOUS
  Filled 2020-05-27 (×4): qty 1

## 2020-05-27 MED ORDER — ONDANSETRON HCL 4 MG/2ML IJ SOLN: 4.0000 mg | Freq: Four times a day (QID) | INTRAMUSCULAR | Status: DC | PRN

## 2020-05-27 MED ORDER — IOHEXOL 300 MG/ML  SOLN
75.0000 mL | Freq: Once | INTRAMUSCULAR | Status: AC | PRN
  Administered 2020-05-27: 75 mL via INTRAVENOUS

## 2020-05-27 MED ORDER — VITAMIN B-12 1000 MCG PO TABS
1000.0000 ug | ORAL_TABLET | Freq: Every day | ORAL | Status: DC
  Administered 2020-05-28 – 2020-06-01 (×5): 1000 ug via ORAL
  Filled 2020-05-27 (×5): qty 1

## 2020-05-27 MED ORDER — POTASSIUM CHLORIDE 20 MEQ/15ML (10%) PO SOLN
40.0000 meq | Freq: Once | ORAL | Status: AC
  Administered 2020-05-27: 40 meq via ORAL
  Filled 2020-05-27: qty 30

## 2020-05-27 MED ORDER — MORPHINE SULFATE (PF) 2 MG/ML IV SOLN
2.0000 mg | INTRAVENOUS | Status: DC | PRN
  Administered 2020-05-27 – 2020-05-30 (×8): 2 mg via INTRAVENOUS
  Filled 2020-05-27 (×8): qty 1

## 2020-05-27 MED ORDER — SODIUM CHLORIDE 0.9 % IV SOLN
2.0000 g | Freq: Three times a day (TID) | INTRAVENOUS | Status: DC
  Administered 2020-05-27 – 2020-05-28 (×3): 2 g via INTRAVENOUS
  Filled 2020-05-27 (×5): qty 2

## 2020-05-27 MED ORDER — SODIUM CHLORIDE 0.9 % IV SOLN: Freq: Once | INTRAVENOUS | Status: AC

## 2020-05-27 MED ORDER — POTASSIUM CHLORIDE CRYS ER 20 MEQ PO TBCR
40.0000 meq | EXTENDED_RELEASE_TABLET | Freq: Once | ORAL | Status: AC
  Administered 2020-05-27: 40 meq via ORAL
  Filled 2020-05-27: qty 2

## 2020-05-27 MED ORDER — VITAMIN K1 10 MG/ML IJ SOLN
10.0000 mg | Freq: Once | INTRAVENOUS | Status: AC
  Administered 2020-05-27: 10 mg via INTRAVENOUS
  Filled 2020-05-27: qty 1

## 2020-05-27 MED ORDER — ADULT MULTIVITAMIN W/MINERALS CH
1.0000 | ORAL_TABLET | Freq: Every day | ORAL | Status: DC
  Administered 2020-05-27 – 2020-06-01 (×6): 1 via ORAL
  Filled 2020-05-27 (×6): qty 1

## 2020-05-27 MED ORDER — THIAMINE HCL 100 MG PO TABS
100.0000 mg | ORAL_TABLET | Freq: Every day | ORAL | Status: DC
  Administered 2020-05-27 – 2020-06-01 (×6): 100 mg via ORAL
  Filled 2020-05-27 (×6): qty 1

## 2020-05-27 MED ORDER — HYDROMORPHONE HCL 1 MG/ML IJ SOLN
0.5000 mg | Freq: Once | INTRAMUSCULAR | Status: AC
  Administered 2020-05-27: 0.5 mg via INTRAVENOUS
  Filled 2020-05-27: qty 1

## 2020-05-27 NOTE — ED Notes (Signed)
Attempted second IV start/blood draw x2, will have another RN attempt

## 2020-05-27 NOTE — ED Notes (Signed)
Sandwich tray and beverage provided to pt, approved per EDP.

## 2020-05-27 NOTE — H&P (Signed)
[facility] History and Physical  Fredderick SeveranceKelly Giovanni ZOX:[id number] DOB: 1981-10-02 DOA: 05/26/2020   PCP: System, Provider Not In  Specialists: Followed by providers at [facility] including the transplant team  Chief Complaint: Worsening abdominal distention with pain since last week  HPI: Fredderick SeveranceKelly Huang is a 39 y.o. female with a past medical history of alcoholic liver cirrhosis followed at [facility].  She apparently is also being followed by the transplant clinic.  Patient mentions that she has been taking her medications including furosemide 20 mg twice a day.  About a week or so ago she started noticing that her abdomen was getting more distended.  She started having a lot of pain in the abdomen which was 7 out of 10 in intensity.  Described as a dull achy pain.  No radiation.  No aggravating or relieving factors.  Some nausea but no vomiting.  No fever or chills.  She has had regular bowel movements.  She denies any lower extremity edema.  Due to worsening symptoms she decided to come to the emergency department.  In the emergency department she was found to have significant hyponatremia.  She was found to have significant ascites.  She underwent paracentesis.  Cell counts raised concern for SBP.  She was started on antibiotics.  She was also given 100 mL of 3% saline for hyponatremia.  [facility] [place].  Subsequently the case was discussed with gastroenterology here who advised that patient may be treated in this facility.  Home Medications: Prior to Admission medications   Medication Sig Start Date End Date Taking? Authorizing Provider  ferrous sulfate 325 (65 FE) MG tablet Take 325 mg by mouth daily with breakfast.   Yes [provider]  furosemide (LASIX) 20 MG tablet Take 20 mg by mouth 2 (two) times daily.   Yes [provider]  magnesium oxide (MAG-OX) 400 MG tablet Take 400 mg by mouth daily.   Yes  [provider]  MILK THISTLE PO Take 1 capsule by mouth daily.   Yes [provider]  Multiple Vitamin (MULTIVITAMIN WITH MINERALS) TABS tablet Take 1 tablet by mouth daily.   Yes [provider]  Potassium 99 MG TABS Take 99 mg by mouth daily.   Yes [provider]  thiamine (VITAMIN B-1) 100 MG tablet Take 100 mg by mouth daily. 09/26/19 09/25/20 Yes [provider]  vitamin B-12 (CYANOCOBALAMIN) 1000 MCG tablet Take 1,000 mcg by mouth daily.   Yes [provider]    Allergies: No Known Allergies  Past Medical History: Past Medical History:  Diagnosis Date  . Blood transfusion without reported diagnosis 2010   misscarriage  . Cirrhosis (HCC)   . Hx of incompetent cervix, currently pregnant   . Medical history non-contributory   . Portal hypertensive gastropathy (HCC)   . Thrombocytopenia (HCC)     Past Surgical History:  Procedure Laterality Date  . CERVICAL CERCLAGE     x4  . ESOPHAGOGASTRODUODENOSCOPY (EGD) WITH PROPOFOL N/A 10/19/2019   Procedure: ESOPHAGOGASTRODUODENOSCOPY (EGD) WITH PROPOFOL;  Surgeon: Wyline MoodAnna, Kiran, MD;  [facility];  Service: Gastroenterology;  Laterality: N/A;    Social History: She lives with her children.  She quit smoking and alcohol consumption in October 2020.   Family History:  Family History  Problem Relation Age of Onset  . Depression Maternal Aunt   . Alcohol abuse Neg Hx   . Arthritis Neg Hx   .  Asthma Neg Hx   . Birth defects Neg Hx   . Cancer Neg Hx   . COPD Neg Hx   . Drug abuse Neg Hx   . Early death Neg Hx   . Diabetes Neg Hx   . Hearing loss Neg Hx   . Heart disease Neg Hx   . Hyperlipidemia Neg Hx   . Hypertension Neg Hx   . Kidney disease Neg Hx   . Learning disabilities Neg Hx   . Mental illness Neg Hx   . Mental retardation Neg Hx   . Miscarriages / Stillbirths Neg Hx   . Stroke Neg Hx   . Vision loss Neg Hx     Review of Systems - History obtained  from the patient General ROS: positive for  - fatigue Psychological ROS: negative Ophthalmic ROS: negative ENT ROS: negative Allergy and Immunology ROS: negative Hematological and Lymphatic ROS: negative Endocrine ROS: negative Respiratory ROS: no cough, shortness of breath, or wheezing Cardiovascular ROS: no chest pain or dyspnea on exertion Gastrointestinal ROS: As in HPI Genito-Urinary ROS: no dysuria, trouble voiding, or hematuria Musculoskeletal ROS: negative Neurological ROS: no TIA or stroke symptoms Dermatological ROS: negative  Physical Examination  Vitals:   05/27/20 1100 05/27/20 1200 05/27/20 1300 05/27/20 1400  BP: (!) 94/54 (!) 92/57 (!) 105/59 107/69  Pulse: 80 84 82 84  Resp:   13 17  Temp:      TempSrc:      SpO2: 97% 100% 97% 100%  Weight:      Height:        BP 107/69   Pulse 84   Temp 98.3 F (36.8 C) (Oral)   Resp 17   Ht  (1.499 m)   Wt 59.9 kg   LMP 09/07/2018 (Approximate)   SpO2 100%   BMI 26.66 kg/m   General appearance: alert, cooperative, appears stated age, icteric and no distress Head: Normocephalic, without obvious abnormality, atraumatic Eyes: conjunctivae/corneas clear. PERRL, EOM's intact.  Throat: lips, mucosa, and tongue normal; teeth and gums normal Neck: no adenopathy, no carotid bruit, no JVD, supple, symmetrical, trachea midline and thyroid not enlarged, symmetric, no tenderness/mass/nodules Resp: clear to auscultation bilaterally Cardio: regular rate and rhythm, S1, S2 normal, no murmur, click, rub or gallop GI: Abdomen is distended.  Fluid thrill is present.  Diffusely tender without any rebound rigidity.  Abdomen is tense however.  Able to percuss liver below the costal margin.  No splenomegaly appreciated. Extremities: extremities normal, atraumatic, no cyanosis or edema Pulses: 2+ and symmetric Skin: Skin color, texture, turgor normal. No rashes or lesions Lymph nodes: Cervical, supraclavicular, and axillary nodes  normal. Neurologic: Alert and oriented x3.  No obvious focal neurological deficits.    Labs on Admission: I have personally reviewed following labs and imaging studies  CBC: Recent Labs  Lab 05/26/20 2133  WBC 21.2*  21.6*  NEUTROABS 18.3*  HGB 9.2*  9.1*  HCT 24.8*  24.5*  MCV 98.0  97.2  PLT 84*  90*   Basic Metabolic Panel: Recent Labs  Lab 05/26/20 2133 05/26/20 2359 05/27/20 0751 05/27/20 1546  NA 119* 117* 117* 120*  K <2.0* <2.0* 2.2* 2.5*  CL 73* 74* 77* 84*  CO2 GLUCOSE 122* 137* 110* 135*  BUN 21* 23* 24* 25*  CREATININE UNABLE TO REPORT DUE TO ICTERUS UNABLE TO REPORT DUE TO ICTERUS  UNABLE TO REPORT DUE TO ICTERUS UNABLE TO REPORT DUE TO ICTERUS  CALCIUM  8.4* 7.9* 7.4* 7.4*  MG  --   --   --  2.0   GFR: CrCl cannot be calculated (This lab value cannot be used to calculate CrCl because it is not a number: UNABLE TO REPORT DUE TO ICTERUS). Liver Function Tests: Recent Labs  Lab 05/26/20 2133 05/26/20 2359 05/27/20 0751  AST 86* 78* 64*  ALT 29 22 22   ALKPHOS 175* 151* 131*  BILITOT 27.3* 29.6* 28.4*  PROT 6.4* 5.9* 5.3*  ALBUMIN 2.2* 2.1* 2.0*   Recent Labs  Lab 05/26/20 2133  LIPASE 33   Coagulation Profile: Recent Labs  Lab 05/26/20 2338  INR 3.0*     Radiological Exams on Admission: CT Abdomen Pelvis W Contrast  Result Date: 05/27/2020 CLINICAL DATA:  Abdominal pain for several days with nausea, initial encounter EXAM: CT ABDOMEN AND PELVIS WITH CONTRAST TECHNIQUE: Multidetector CT imaging of the abdomen and pelvis was performed using the standard protocol following bolus administration of intravenous contrast. CONTRAST:  62mL OMNIPAQUE IOHEXOL 300 MG/ML  SOLN COMPARISON:  None. FINDINGS: Lower chest: No acute abnormality. Hepatobiliary: Some patchy areas of decreased attenuation are noted within the liver. Underlying metastatic disease could not be totally excluded. These changes could be related to underlying cirrhosis.  Recanalization of the umbilical vein is noted. Generalized ascites is seen. Gallbladder demonstrates dependent density consistent with small stones. Pancreas: Pancreas is within normal limits. Spleen: Spleen is enlarged without focal mass consistent with underlying portal hypertension. Adrenals/Urinary Tract: Adrenal glands are within normal limits. Kidneys demonstrate a normal enhancement pattern. No calculi are seen. Delayed images demonstrate no significant excretion of contrast which may be related to some underlying renal failure. Stomach/Bowel: Colon is well visualized. Diverticular changes noted without diverticulitis. No obstructive or inflammatory changes are seen. The appendix is not well visualized. Small bowel is unremarkable. Stomach is within normal limits. 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 Vascular/Lymphatic: Aortic atherosclerosis. No enlarged abdominal or pelvic lymph nodes. Reproductive: Uterus is within normal limits. Prominence of the ovarian veins is seen consistent with some pelvic varices. No adnexal mass is noted. Other: Moderate ascites is seen consistent with the underlying cirrhosis. Musculoskeletal: No acute or significant osseous findings. IMPRESSION: Changes consistent with cirrhosis of the liver with portal hypertension. Associated splenomegaly, ascites and varices are noted. Multiple areas of decreased attenuation within the liver incompletely evaluated on this exam. These may represent changes of cirrhosis and regenerating nodules. Underlying neoplasm could not be totally excluded. Cholelithiasis. Diverticulosis without diverticulitis. Electronically Signed   By: 70m M.D.   On: 05/27/2020 02:20   07/28/2020 Paracentesis  Result Date: 05/27/2020 INDICATION: History of alcoholic cirrhosis, now with symptomatic intra-abdominal ascites. Please perform ultrasound-guided paracentesis for diagnostic therapeutic purposes. EXAM:  ULTRASOUND-GUIDED PARACENTESIS COMPARISON:  CT abdomen and pelvis-05/27/2020 MEDICATIONS: None. COMPLICATIONS: None immediate. TECHNIQUE: Informed written consent was obtained from the patient after a discussion of the risks, benefits and alternatives to treatment. A timeout was performed prior to the initiation of the procedure. Initial ultrasound scanning demonstrates a large amount of ascites within the right lower abdomen which was subsequently prepped and draped in the usual sterile fashion. 1% lidocaine with epinephrine was used for local anesthesia. An ultrasound image was saved for documentation purposed. An 8 Fr Safe-T-Centesis catheter was introduced. The paracentesis was performed. The catheter was removed and a dressing was applied. The patient tolerated the procedure well without immediate post procedural complication. FINDINGS: A total of approximately 4 liters of dark yellow  serous fluid was removed. Samples were sent to the laboratory as requested by the clinical team. IMPRESSION: Successful ultrasound-guided paracentesis yielding 4 liters of peritoneal fluid. Electronically Signed   By: Simonne Come M.D.   On: 05/27/2020 10:12   DG Chest Port 1 View  Result Date: 05/27/2020 CLINICAL DATA:  Shortness of breath EXAM: PORTABLE CHEST 1 VIEW COMPARISON:  09/10/2019 FINDINGS: The heart size and mediastinal contours are within normal limits. Both lungs are clear. The visualized skeletal structures are unremarkable. IMPRESSION: No active disease. Electronically Signed   By: Alcide Clever M.D.   On: 05/27/2020 00:13   US ABDOMINAL PELVIC ART/VENT FLOW DOPPLER LIMITED  Result Date: 05/27/2020 CLINICAL DATA:  Cirrhosis.  Ascites.  Cholelithiasis. EXAM: ULTRASOUND ABDOMEN LIMITED RIGHT UPPER QUADRANT COMPARISON:  CT 04/27/2020. FINDINGS: Gallbladder: 4.5 mm gallstone noted. Prominent amount of sludge noted. Prominent gallbladder wall thickening to 7.5 mm. This could be from hypoproteinemia and or  cholecystitis. Common bile duct: Diameter: 3.6 mm Liver: Heterogeneous nodular parenchymal pattern consistent with cirrhosis. No mass noted. Reversal of flow is noted in the portal vein. A portion of the portal vein appears to be thrombosed. The umbilical vein appears to be recanalized. Other: Ascites. IMPRESSION: 1. 4.5 mm gallstone. Prominent amount of sludge. Gallbladder wall thickening to 7.5 mm. This could be from hypoproteinemia and or cholecystitis. No biliary distention. 2. Heterogeneous nodular hepatic parenchymal pattern consistent with cirrhosis. No hepatic mass noted. 3. Reversal of flow is noted in the portal vein. A portion of the portal vein appears to be thrombosed. The umbilical vein appears to be recanalized. 4.  Ascites. Electronically Signed   By: Maisie Fus  Register   On: 05/27/2020 06:15   US ABDOMEN LIMITED RUQ  Result Date: 05/27/2020 CLINICAL DATA:  Cirrhosis.  Ascites.  Cholelithiasis. EXAM: ULTRASOUND ABDOMEN LIMITED RIGHT UPPER QUADRANT COMPARISON:  CT 04/27/2020. FINDINGS: Gallbladder: 4.5 mm gallstone noted. Prominent amount of sludge noted. Prominent gallbladder wall thickening to 7.5 mm. This could be from hypoproteinemia and or cholecystitis. Common bile duct: Diameter: 3.6 mm Liver: Heterogeneous nodular parenchymal pattern consistent with cirrhosis. No mass noted. Reversal of flow is noted in the portal vein. A portion of the portal vein appears to be thrombosed. The umbilical vein appears to be recanalized. Other: Ascites. IMPRESSION: 1. 4.5 mm gallstone. Prominent amount of sludge. Gallbladder wall thickening to 7.5 mm. This could be from hypoproteinemia and or cholecystitis. No biliary distention. 2. Heterogeneous nodular hepatic parenchymal pattern consistent with cirrhosis. No hepatic mass noted. 3. Reversal of flow is noted in the portal vein. A portion of the portal vein appears to be thrombosed. The umbilical vein appears to be recanalized. 4.  Ascites. Electronically  Signed   By: Maisie Fus  Register   On: 05/27/2020 06:15    My interpretation of Electrocardiogram: Sinus rhythm at 90 bpm.  Normal axis.  Intervals are normal.  No concerning ST or T wave changes.   Problem List  Active Problems:   Hypokalemia   Alcoholic cirrhosis of liver with ascites (HCC)   SBP (spontaneous bacterial peritonitis) (HCC)   Hyponatremia   Hyperbilirubinemia   Normocytic anemia   Assessment: This is a 39 year old Caucasian female who has a previous history of alcohol abuse and has alcoholic liver cirrhosis who comes in with abdominal pain and distention.  She has significant ascites.  She underwent paracentesis and cell count is high raising concern for spontaneous bacterial peritonitis.  Also noted to have significant hyponatremia and hypokalemia.  Plan:  1. Spontaneous  bacterial peritonitis in a patient with known history of alcoholic liver cirrhosis: Follow-up on cultures.  They do not have cefotaxime in this facility.  Patient is on cefepime which will be continued.  Patient will benefit from diuretics as well but will wait for her potassium level to improve.  2.  Severe hyponatremia and hypokalemia: She was given 100 mL of 3% saline in the emergency department.  Patient will be given potassium.  Recheck her potassium level this evening.  Magnesium was normal.  Sodium level is low most likely due to hypovolemia from liver cirrhosis.  Her previous sodium levels have been low but not this low.  She will be placed on a fluid restriction.  Check urine osmolality and urine electrolytes.  Once her potassium level has improved we will give her diuretics.  Recheck her sodium level tonight and tomorrow morning.  May need to get nephrology assistance as well.  3.  Normocytic anemia: Hemoglobin seems to be close to baseline.  No evidence of overt bleeding.  4.  Leukocytosis: This is most likely due to infection.  There could also be a reactive component.  Monitor labs.  5.   History of alcoholic liver cirrhosis with portal hypertension/ascites/hyperbilirubinemia: Apparently followed at [facility].  They were contacted for patient transfer however they did not have any beds available.  Her acute decompensation could be related to the SBP.  We will treat her SBP monitor labs closely.  May get assistance from our gastroenterologist as well.  Her INR is noted to be elevated.  She has been given vitamin K which will be repeated.  Continue with thiamine and multivitamins.  Fluid restriction.  Check ammonia level.  Resume diuretics when electrolytes are better.  CT scan did not show any other acute findings.  DVT Prophylaxis: SCDs Code Status: Full code Family Communication: Discussed with the patient Disposition: Hopefully home when improved Consults called: None as of now Admission Status: Status is: Inpatient  Remains inpatient appropriate because:Ongoing diagnostic testing needed not appropriate for outpatient work up and Inpatient level of care appropriate due to severity of illness   Dispo: The patient is from: Home              Anticipated d/c is to: Home              Anticipated d/c date is: 2 days              Patient currently is not medically stable to d/c.      Severity of Illness: The appropriate patient status for this patient is INPATIENT. Inpatient status is judged to be reasonable and necessary in order to provide the required intensity of service to ensure the patient's safety. The patient's presenting symptoms, physical exam findings, and initial radiographic and laboratory data in the context of their chronic comorbidities is felt to place them at high risk for further clinical deterioration. Furthermore, it is not anticipated that the patient will be medically stable for discharge from the hospital within 2 midnights of admission. The following factors support the patient status of inpatient.   " The patient's presenting symptoms include abdominal  distention and pain. " The worrisome physical exam findings include ascites. " The initial radiographic and laboratory data are worrisome because of SBP. " The chronic co-morbidities include alcoholic liver cirrhosis.   * I certify that at the point of admission it is my clinical judgment that the patient will require inpatient hospital care spanning beyond 2 midnights from  the point of admission due to high intensity of service, high risk for further deterioration and high frequency of surveillance required.*    Further management decisions will depend on results of further testing and patient's response to treatment.  Derek Huneycutt [employer]  [facility] [employer] on [email or web address]  05/27/2020, 6:18 PM

## 2020-05-27 NOTE — Procedures (Signed)
Pre Procedural Dx: Symptomatic Ascites Post Procedural Dx: Same  Successful US guided paracentesis yielding 4 L of dark yellow serous ascitic fluid. Sample sent to laboratory as requested.  EBL: None  Complications: None immediate  Katherina Right, MD Pager #: [phone]

## 2020-05-27 NOTE — ED Notes (Signed)
Called [facility] and [facility] pt is still awaiting a bed assignment at both facilities.

## 2020-05-27 NOTE — ED Notes (Signed)
Lunch tray provided to pt.

## 2020-05-27 NOTE — ED Provider Notes (Signed)
Evaluated the patient.  She is in no distress.  Fully awake and alert with normal mental status.  Currently ambulatory.  She has evidence of spontaneous bacterial peritonitis.  Her electrolytes are improving and her mental status is normal.  At this point, she is showing improvement in the emergency department with treatments and has been started on antibiotic by Dr. Juliette Alcide.  I reviewed her case with our gastroenterologist Dr. Servando Snare, and neither here I see compelling reason that this patient would require transfer to [facility] or [facility] at this point.  Neither [facility] or [facility] have available beds, and the patient reports that she would prefer to stay at [facility] as well.    Plan to discuss with our hospitalist, patient understanding and agreeable with plan for admission.   Patient admitted to hospitalist service.  Discussed with Dr. Ellender Hose, MD 05/27/20 [phone]

## 2020-05-27 NOTE — ED Provider Notes (Signed)
Patient's second electrolyte set comes back her potassium is better sodium is no better.  I relayed this information to both [facility] and [facility] she still on the waiting list to both places.  Patient goes for paracentesis.  Patient is results are consistent with spontaneous bacterial peritonitis although her belly is now not tender.  Up-to-date recommends Claforan but apparently we do not have that here so I have ordered Maxipime instead.  Additionally I have given the patient 1 run of 100 cc of 3% normal saline and some regular normal saline.  I have allowed her to eat and given her 40 mEq of potassium by mouth.  We are going to repeat the lites again.  Patient is still on a waiting list.  She looks good and feels a lot better.  She still very jaundiced however of course I am signing the patient out to Dr. Deneen Harts, Bebe Shaggy, MD 05/27/20 1535

## 2020-05-28 ENCOUNTER — Other Ambulatory Visit: Payer: Self-pay

## 2020-05-28 ENCOUNTER — Encounter: Payer: Self-pay | Admitting: Internal Medicine

## 2020-05-28 DIAGNOSIS — K652 Spontaneous bacterial peritonitis: Secondary | ICD-10-CM | POA: Diagnosis present

## 2020-05-28 LAB — BASIC METABOLIC PANEL
Anion gap: 14 (ref 5–15)
Anion gap: 9 (ref 5–15)
Anion gap: 11 (ref 5–15)
BUN: 27 mg/dL — ABNORMAL HIGH (ref 6–20)
BUN: 24 mg/dL — ABNORMAL HIGH (ref 6–20)
BUN: 22 mg/dL — ABNORMAL HIGH (ref 6–20)
CO2: 22 mmol/L (ref 22–32)
CO2: 26 mmol/L (ref 22–32)
CO2: 25 mmol/L (ref 22–32)
Calcium: 8 mg/dL — ABNORMAL LOW (ref 8.9–10.3)
Calcium: 8.1 mg/dL — ABNORMAL LOW (ref 8.9–10.3)
Calcium: 8.2 mg/dL — ABNORMAL LOW (ref 8.9–10.3)
Chloride: 86 mmol/L — ABNORMAL LOW (ref 98–111)
Chloride: 88 mmol/L — ABNORMAL LOW (ref 98–111)
Chloride: 89 mmol/L — ABNORMAL LOW (ref 98–111)
Creatinine, Ser: UNDETERMINED mg/dL (ref 0.44–1.00)
Creatinine, Ser: UNDETERMINED mg/dL (ref 0.44–1.00)
Creatinine, Ser: UNDETERMINED mg/dL (ref 0.44–1.00)
Glucose, Bld: 108 mg/dL — ABNORMAL HIGH (ref 70–99)
Glucose, Bld: 134 mg/dL — ABNORMAL HIGH (ref 70–99)
Glucose, Bld: 110 mg/dL — ABNORMAL HIGH (ref 70–99)
Potassium: 2.8 mmol/L — ABNORMAL LOW (ref 3.5–5.1)
Potassium: 3 mmol/L — ABNORMAL LOW (ref 3.5–5.1)
Potassium: 2.9 mmol/L — ABNORMAL LOW (ref 3.5–5.1)
Sodium: 122 mmol/L — ABNORMAL LOW (ref 135–145)
Sodium: 123 mmol/L — ABNORMAL LOW (ref 135–145)
Sodium: 125 mmol/L — ABNORMAL LOW (ref 135–145)

## 2020-05-28 LAB — COMPREHENSIVE METABOLIC PANEL
ALT: 24 U/L (ref 0–44)
AST: 56 U/L — ABNORMAL HIGH (ref 15–41)
Albumin: 2.3 g/dL — ABNORMAL LOW (ref 3.5–5.0)
Alkaline Phosphatase: 143 U/L — ABNORMAL HIGH (ref 38–126)
Anion gap: 9 (ref 5–15)
BUN: 27 mg/dL — ABNORMAL HIGH (ref 6–20)
CO2: 24 mmol/L (ref 22–32)
Calcium: 7.7 mg/dL — ABNORMAL LOW (ref 8.9–10.3)
Chloride: 87 mmol/L — ABNORMAL LOW (ref 98–111)
Creatinine, Ser: UNDETERMINED mg/dL (ref 0.44–1.00)
Glucose, Bld: 139 mg/dL — ABNORMAL HIGH (ref 70–99)
Potassium: 2.4 mmol/L — CL (ref 3.5–5.1)
Sodium: 120 mmol/L — ABNORMAL LOW (ref 135–145)
Total Bilirubin: 28.6 mg/dL (ref 0.3–1.2)
Total Protein: 5.5 g/dL — ABNORMAL LOW (ref 6.5–8.1)

## 2020-05-28 LAB — CBC
HCT: 21.6 % — ABNORMAL LOW (ref 36.0–46.0)
Hemoglobin: 7.8 g/dL — ABNORMAL LOW (ref 12.0–15.0)
MCH: 35.8 pg — ABNORMAL HIGH (ref 26.0–34.0)
MCHC: 36.1 g/dL — ABNORMAL HIGH (ref 30.0–36.0)
MCV: 99.1 fL (ref 80.0–100.0)
Platelets: 94 10*3/uL — ABNORMAL LOW (ref 150–400)
RBC: 2.18 MIL/uL — ABNORMAL LOW (ref 3.87–5.11)
RDW: 15.1 % (ref 11.5–15.5)
WBC: 21.6 10*3/uL — ABNORMAL HIGH (ref 4.0–10.5)
nRBC: 0 % (ref 0.0–0.2)

## 2020-05-28 LAB — MAGNESIUM: Magnesium: 2.1 mg/dL (ref 1.7–2.4)

## 2020-05-28 LAB — PROTIME-INR
INR: 3.1 — ABNORMAL HIGH (ref 0.8–1.2)
Prothrombin Time: 31.1 seconds — ABNORMAL HIGH (ref 11.4–15.2)

## 2020-05-28 LAB — PROTEIN, BODY FLUID (OTHER): Total Protein, Body Fluid Other: 0.7 g/dL

## 2020-05-28 MED ORDER — MIDODRINE HCL 5 MG PO TABS
2.5000 mg | ORAL_TABLET | Freq: Three times a day (TID) | ORAL | Status: AC
  Administered 2020-05-28: 2.5 mg via ORAL

## 2020-05-28 MED ORDER — ALBUMIN HUMAN 25 % IV SOLN
25.0000 g | Freq: Once | INTRAVENOUS | Status: AC
  Administered 2020-05-28: 25 g via INTRAVENOUS
  Filled 2020-05-28: qty 100

## 2020-05-28 MED ORDER — POTASSIUM CHLORIDE CRYS ER 20 MEQ PO TBCR
40.0000 meq | EXTENDED_RELEASE_TABLET | ORAL | Status: AC
  Administered 2020-05-28 – 2020-05-29 (×2): 40 meq via ORAL
  Filled 2020-05-28 (×2): qty 2

## 2020-05-28 MED ORDER — MIDODRINE HCL 5 MG PO TABS: 2.5000 mg | ORAL_TABLET | Freq: Three times a day (TID) | ORAL | Status: DC

## 2020-05-28 MED ORDER — LACTULOSE 10 GM/15ML PO SOLN
10.0000 g | Freq: Three times a day (TID) | ORAL | Status: DC
  Administered 2020-05-28 – 2020-05-30 (×6): 10 g via ORAL
  Filled 2020-05-28 (×6): qty 30

## 2020-05-28 MED ORDER — FUROSEMIDE 10 MG/ML IJ SOLN
20.0000 mg | Freq: Once | INTRAMUSCULAR | Status: AC
  Administered 2020-05-28: 20 mg via INTRAVENOUS
  Filled 2020-05-28: qty 2

## 2020-05-28 MED ORDER — SODIUM CHLORIDE 0.9 % IV SOLN
2.0000 g | INTRAVENOUS | Status: DC
  Administered 2020-05-28 – 2020-06-01 (×5): 2 g via INTRAVENOUS
  Filled 2020-05-28 (×2): qty 20
  Filled 2020-05-28 (×4): qty 2

## 2020-05-28 MED ORDER — MIDODRINE HCL 5 MG PO TABS
5.0000 mg | ORAL_TABLET | Freq: Three times a day (TID) | ORAL | Status: DC
  Administered 2020-05-28 – 2020-05-29 (×2): 5 mg via ORAL
  Filled 2020-05-28 (×3): qty 1

## 2020-05-28 MED ORDER — POTASSIUM CHLORIDE CRYS ER 20 MEQ PO TBCR
40.0000 meq | EXTENDED_RELEASE_TABLET | ORAL | Status: AC
  Administered 2020-05-28 (×2): 40 meq via ORAL
  Filled 2020-05-28 (×2): qty 2

## 2020-05-28 MED ORDER — SODIUM CHLORIDE 0.9 % IV BOLUS
250.0000 mL | Freq: Once | INTRAVENOUS | Status: AC
  Administered 2020-05-28: 250 mL via INTRAVENOUS

## 2020-05-28 MED ORDER — POTASSIUM CHLORIDE 10 MEQ/100ML IV SOLN
10.0000 meq | INTRAVENOUS | Status: AC
  Administered 2020-05-28 (×4): 10 meq via INTRAVENOUS
  Filled 2020-05-28 (×2): qty 100

## 2020-05-28 MED ORDER — POTASSIUM CHLORIDE CRYS ER 20 MEQ PO TBCR
40.0000 meq | EXTENDED_RELEASE_TABLET | Freq: Once | ORAL | Status: AC
  Administered 2020-05-28: 06:00:00 40 meq via ORAL
  Filled 2020-05-28: qty 2

## 2020-05-28 NOTE — Progress Notes (Signed)
Report called to RN on 1C. Asked to wait to send until BP is a bit better. Waiting for albumin to infuse and will recheck.

## 2020-05-28 NOTE — ED Notes (Addendum)
This RN informed Lindsey Huang (floor  of the pt's low potassium

## 2020-05-28 NOTE — Progress Notes (Signed)
[facility] PROGRESS NOTE   Lindsey Huang WEX:[id number] DOB: October 26, 1981 DOA: 05/26/2020  PCP: System, Provider Not In  Brief History/Interval Summary: Lindsey Huang is a 39 y.o. female with a past medical history of alcoholic liver cirrhosis followed at [facility].  She apparently is also being followed by the transplant clinic.  Patient mentions that she has been taking her medications including furosemide 20 mg twice a day.  About a week or so ago she started noticing that her abdomen was getting more distended.  She started having a lot of pain in the abdomen which was 7 out of 10 in intensity.  Described as a dull achy pain.  No radiation.  No aggravating or relieving factors.  Some nausea but no vomiting.  No fever or chills.  She has had regular bowel movements.  She denies any lower extremity edema.  Due to worsening symptoms she decided to come to the emergency department.  In the emergency department she was found to have significant hyponatremia.  She was found to have significant ascites.  She underwent paracentesis.  Cell counts raised concern for SBP.  She was started on antibiotics.  She was also given 100 mL of 3% saline for hyponatremia.  [facility] well as [facility] in [place] were contacted for transfer.  Subsequently the case was discussed with gastroenterology here who advised that patient may be treated in this facility.  Reason for Visit: Hyponatremia.  Alcoholic liver cirrhosis.  Consultants: None yet  Procedures: None  Antibiotics: Anti-infectives (From admission, onward)   Start     Dose/Rate Route Frequency Ordered Stop   05/27/20 1400  ceFEPIme (MAXIPIME) 2 g in sodium chloride 0.9 % 100 mL IVPB     Discontinue     2 g 200 mL/hr over 30 Minutes Intravenous Every 8 hours 05/27/20 1347     05/26/20 2345  piperacillin-tazobactam (ZOSYN) IVPB 3.375 g        3.375 g 100 mL/hr over 30 Minutes Intravenous  Once 05/26/20 2342 05/27/20 0259        Subjective/Interval History: Patient states that she is feeling slightly better this morning.  Abdominal pain has improved.  Denies any nausea or vomiting.  No dizziness or lightheadedness.  ROS: No chest pain or shortness of breath    Assessment/Plan:  Spontaneous bacterial peritonitis in a patient with a known history of alcoholic liver cirrhosis and portal hypertension Patient is currently on cefepime which will be continued.  Follow-up on cultures.  Abdominal pain has improved.  WBC remains elevated.  Hyponatremia and hypokalemia Magnesium level was 2.1.  Potassium level has improved though remains low.  This will be repleted.  Sodium level has also improved.  This is most likely due to excessive fluid intake in the setting of liver cirrhosis.  Urine osmolality was 126.  Urine potassium 12.  Urine sodium less than 10.  She may also benefit from diuresis.  She is already on a fluid restriction.  Blood pressure noted to be low.  She has been started on midodrine.  We will give her albumin with Lasix x1 today.  Hold off on nephrology consultation as her sodium level has been improved.  Hypotension Likely due to liver cirrhosis.  Midodrine has been initiated.  Normocytic anemia Drop in hemoglobin is noted.  No evidence of overt bleeding.  Transfuse if it goes below 7.  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  Stable.  History of alcoholic liver cirrhosis with portal hypertension/ascites/hyperbilirubinemia Patient  is followed at [facility].  She is also followed by the transplant team there.  They were contacted yesterday for transfer however they did not have any beds available.  Acute decompensation likely due to SBP.  She is getting treated as discussed above.  INR is also elevated.  Vitamin K is being given.  Continue with fluid restriction.  She tells me that she takes lactulose at home which will be resumed.  CT scan of the abdomen did not show any other acute findings.   No biliary ductal dilatation noted on ultrasound.    DVT Prophylaxis: SCDs Code Status: Full code Family Communication: Discussed with the patient Disposition Plan:  Status is: Inpatient  Remains inpatient appropriate because:Persistent severe electrolyte disturbances   Dispo: The patient is from: Home              Anticipated d/c is to: Home              Anticipated d/c date is: 3 days              Patient currently is not medically stable to d/c.     Medications:  Scheduled: . midodrine  2.5 mg Oral TID WC  . multivitamin with minerals  1 tablet Oral Daily  . thiamine  100 mg Oral Daily  . vitamin B-12  1,000 mcg Oral Daily   Continuous: . ceFEPime (MAXIPIME) IV 2 g (05/28/20 0828)  . phytonadione (VITAMIN K) IV     VFI:EPPIRJJO injection, ondansetron **OR** ondansetron (ZOFRAN) IV   Objective:  Vital Signs  Vitals:   05/28/20 0330 05/28/20 0439 05/28/20 0812 05/28/20 1155  BP: (!) 90/54 (!) 99/56 96/60 (!) 90/49  Pulse: 82 81 85 86  Resp: Temp:  98 F (36.7 C) 97.8 F (36.6 C) 98.1 F (36.7 C)  TempSrc:  Oral Oral Oral  SpO2: 97% 100% 98% 100%  Weight:      Height:        Intake/Output Summary (Last 24 hours) at 05/28/2020 1159 Last data filed at 05/28/2020 0600 Gross per 24 hour  Intake 89.84 ml  Output --  Net 89.84 ml   Filed Weights   05/26/20 2124  Weight: 59.9 kg    General appearance: Awake alert.  In no distress Resp: Clear to auscultation bilaterally.  Normal effort Cardio: S1-S2 is normal regular.  No S3-S4.  No rubs murmurs or bruit GI: Abdomen is distended.  Tense ascites.  Nontender.  Bowel sounds present. Extremities: No edema.  Full range of motion of lower extremities. Neurologic: Alert and oriented x3.  No focal neurological deficits.    Lab Results:  Data Reviewed: I have personally reviewed following labs and imaging studies  CBC: Recent Labs  Lab 05/26/20 2133 05/28/20 0326  WBC 21.2*  21.6* 21.6*   NEUTROABS 18.3*  --   HGB 9.2*  9.1* 7.8*  HCT 24.8*  24.5* 21.6*  MCV 98.0  97.2 99.1  PLT 84*  90* 94*    Basic Metabolic Panel: Recent Labs  Lab 05/27/20 0751 05/27/20 1546 05/28/20 0326 05/28/20 0525 05/28/20 1128  NA 117* 120* 120* 122* 123*  K 2.2* 2.5* 2.4* 2.8* 3.0*  CL 77* 84* 87* 86* 88*  CO2 GLUCOSE 110* 135* 139* 108* 134*  BUN 24* 25* 27* 27* 24*  CREATININE UNABLE TO REPORT DUE TO ICTERUS UNABLE TO REPORT DUE TO ICTERUS UNABLE TO REPORT DUE TO ICTERUS UNABLE TO  REPORT DUE TO ICTERUS PENDING  CALCIUM 7.4* 7.4* 7.7* 8.0* 8.1*  MG  --  2.0 2.1  --   --     GFR: CrCl cannot be calculated (This lab value cannot be used to calculate CrCl because it is not a number: PENDING).  Liver Function Tests: Recent Labs  Lab 05/26/20 2133 05/26/20 2359 05/27/20 0751 05/28/20 0326  AST 86* 78* 64* 56*  ALT 29 22 22 24   ALKPHOS 175* 151* 131* 143*  BILITOT 27.3* 29.6* 28.4* 28.6*  PROT 6.4* 5.9* 5.3* 5.5*  ALBUMIN 2.2* 2.1* 2.0* 2.3*    Recent Labs  Lab 05/26/20 2133  LIPASE 33    Coagulation Profile: Recent Labs  Lab 05/26/20 2338 05/28/20 0326  INR 3.0* 3.1*      Recent Results (from the past 240 hour(s))  SARS Coronavirus 2 by RT PCR (hospital order, performed in [facility] hospital lab) Nasopharyngeal Nasopharyngeal Swab     Status: None   Collection Time: 05/26/20 11:38 PM   Specimen: Nasopharyngeal Swab  Result Value Ref Range Status   SARS Coronavirus 2 NEGATIVE NEGATIVE Final    Comment: (NOTE) SARS-CoV-2 target nucleic acids are NOT DETECTED.  The SARS-CoV-2 RNA is generally detectable in upper and lower respiratory specimens during the acute phase of infection. The lowest concentration of SARS-CoV-2 viral copies this assay can detect is 250 copies / mL. A negative result does not preclude SARS-CoV-2 infection and should not be used as the sole basis for treatment or other patient management decisions.  A negative  result may occur with improper specimen collection / handling, submission of specimen other than nasopharyngeal swab, presence of viral mutation(s) within the areas targeted by this assay, and inadequate number of viral copies (<250 copies / mL). A negative result must be combined with clinical observations, patient history, and epidemiological information.  Fact Sheet for Patients:   07/27/20  Fact Sheet for Healthcare Providers: BoilerBrush.com.cy  This test is not yet approved or  cleared by the https://pope.com/ FDA and has been authorized for detection and/or diagnosis of SARS-CoV-2 by FDA under an Emergency Use Authorization (EUA).  This EUA will remain in effect (meaning this test can be used) for the duration of the COVID-19 declaration under Section 564(b)(1) of the Act, 21 U.S.C. section 360bbb-3(b)(1), unless the authorization is terminated or revoked sooner.  Performed at [facility], [address] [place], [place]   Culture, blood (routine x 2)     Status: None (Preliminary result)   Collection Time: 05/26/20 11:59 PM   Specimen: Right Antecubital; Blood  Result Value Ref Range Status   Specimen Description RIGHT ANTECUBITAL  Final   Special Requests   Final    BOTTLES DRAWN AEROBIC AND ANAEROBIC Blood Culture adequate volume   Culture   Final    NO GROWTH 1 DAY Performed at [facility], [address] [place], [place]    Report Status PENDING  Incomplete  Culture, blood (routine x 2)     Status: None (Preliminary result)   Collection Time: 05/26/20 11:59 PM   Specimen: Right Antecubital; Blood  Result Value Ref Range Status   Specimen Description RIGHT ANTECUBITAL  Final   Special Requests   Final    BOTTLES DRAWN AEROBIC AND ANAEROBIC Blood Culture adequate volume   Culture   Final    NO GROWTH 1 DAY Performed at [facility], [address],  [place], [place]  Report Status PENDING  Incomplete  Gram stain     Status: None   Collection Time: 05/27/20  9:40 AM   Specimen: PATH Cytology Peritoneal fluid  Result Value Ref Range Status   Specimen Description PERITONEAL  Final   Special Requests NONE  Final   Gram Stain   Final    WBC SEEN NO ORGANISMS SEEN Performed at [facility], [address] [place], [place] [address]    Report Status 05/27/2020 FINAL  Final  Body fluid culture     Status: None (Preliminary result)   Collection Time: 05/27/20  9:40 AM   Specimen: PATH Cytology Peritoneal fluid  Result Value Ref Range Status   Specimen Description   Final    PERITONEAL Performed at [facility], [address] [place], [place] [address]    Special Requests   Final    NONE Performed at [facility], [address] [place], [place] [address]    Gram Stain   Final    ABUNDANT WBC PRESENT,BOTH PMN AND MONONUCLEAR NO ORGANISMS SEEN    Culture   Final    NO GROWTH < 24 HOURS Performed at [facility], [address] [place], [place] [address]    Report Status PENDING  Incomplete      Radiology Studies: CT Abdomen Pelvis W Contrast  Result Date: 05/27/2020 CLINICAL DATA:  Abdominal pain for several days with nausea, initial encounter EXAM: CT ABDOMEN AND PELVIS WITH CONTRAST TECHNIQUE: Multidetector CT imaging of the abdomen and pelvis was performed using the standard protocol following bolus administration of intravenous contrast. CONTRAST:  75mL OMNIPAQUE IOHEXOL 300 MG/ML  SOLN COMPARISON:  None. FINDINGS: Lower chest: No acute abnormality. Hepatobiliary: Some patchy areas of decreased attenuation are noted within the liver. Underlying metastatic disease could not be totally excluded. These changes could be related to underlying cirrhosis. Recanalization of the umbilical vein is noted. Generalized ascites is seen. Gallbladder demonstrates dependent density consistent with  small stones. Pancreas: Pancreas is within normal limits. Spleen: Spleen is enlarged without focal mass consistent with underlying portal hypertension. Adrenals/Urinary Tract: Adrenal glands are within normal limits. Kidneys demonstrate a normal enhancement pattern. No calculi are seen. Delayed images demonstrate no significant excretion of contrast which may be related to some underlying renal failure. Stomach/Bowel: Colon is well visualized. Diverticular changes noted without diverticulitis. No obstructive or inflammatory changes are seen. The appendix is not well visualized. Small bowel is unremarkable. Stomach is within normal limits. 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 Vascular/Lymphatic: Aortic atherosclerosis. No enlarged abdominal or pelvic lymph nodes. Reproductive: Uterus is within normal limits. Prominence of the ovarian veins is seen consistent with some pelvic varices. No adnexal mass is noted. Other: Moderate ascites is seen consistent with the underlying cirrhosis. Musculoskeletal: No acute or significant osseous findings. IMPRESSION: Changes consistent with cirrhosis of the liver with portal hypertension. Associated splenomegaly, ascites and varices are noted. Multiple areas of decreased attenuation within the liver incompletely evaluated on this exam. These may represent changes of cirrhosis and regenerating nodules. Underlying neoplasm could not be totally excluded. Cholelithiasis. Diverticulosis without diverticulitis. Electronically Signed   By: Alcide Clever M.D.   On: 05/27/2020 02:20   US Paracentesis  Result Date: 05/27/2020 INDICATION: History of alcoholic cirrhosis, now with symptomatic intra-abdominal ascites. Please perform ultrasound-guided paracentesis for diagnostic therapeutic purposes. EXAM: ULTRASOUND-GUIDED PARACENTESIS COMPARISON:  CT abdomen and pelvis-05/27/2020 MEDICATIONS: None. COMPLICATIONS: None immediate. TECHNIQUE:  Informed written consent was  obtained from the patient after a discussion of the risks, benefits and alternatives to treatment. A timeout was performed prior to the initiation of the procedure. Initial ultrasound scanning demonstrates a large amount of ascites within the right lower abdomen which was subsequently prepped and draped in the usual sterile fashion. 1% lidocaine with epinephrine was used for local anesthesia. An ultrasound image was saved for documentation purposed. An 8 Fr Safe-T-Centesis catheter was introduced. The paracentesis was performed. The catheter was removed and a dressing was applied. The patient tolerated the procedure well without immediate post procedural complication. FINDINGS: A total of approximately 4 liters of dark yellow serous fluid was removed. Samples were sent to the laboratory as requested by the clinical team. IMPRESSION: Successful ultrasound-guided paracentesis yielding 4 liters of peritoneal fluid. Electronically Signed   By: Simonne ComeJohn  Watts M.D.   On: 05/27/2020 10:12   DG Chest Port 1 View  Result Date: 05/27/2020 CLINICAL DATA:  Shortness of breath EXAM: PORTABLE CHEST 1 VIEW COMPARISON:  09/10/2019 FINDINGS: The heart size and mediastinal contours are within normal limits. Both lungs are clear. The visualized skeletal structures are unremarkable. IMPRESSION: No active disease. Electronically Signed   By: Alcide CleverMark  Lukens M.D.   On: 05/27/2020 00:13   US ABDOMINAL PELVIC ART/VENT FLOW DOPPLER LIMITED  Result Date: 05/27/2020 CLINICAL DATA:  Cirrhosis.  Ascites.  Cholelithiasis. EXAM: ULTRASOUND ABDOMEN LIMITED RIGHT UPPER QUADRANT COMPARISON:  CT 04/27/2020. FINDINGS: Gallbladder: 4.5 mm gallstone noted. Prominent amount of sludge noted. Prominent gallbladder wall thickening to 7.5 mm. This could be from hypoproteinemia and or cholecystitis. Common bile duct: Diameter: 3.6 mm Liver: Heterogeneous nodular parenchymal pattern consistent with cirrhosis. No mass noted.  Reversal of flow is noted in the portal vein. A portion of the portal vein appears to be thrombosed. The umbilical vein appears to be recanalized. Other: Ascites. IMPRESSION: 1. 4.5 mm gallstone. Prominent amount of sludge. Gallbladder wall thickening to 7.5 mm. This could be from hypoproteinemia and or cholecystitis. No biliary distention. 2. Heterogeneous nodular hepatic parenchymal pattern consistent with cirrhosis. No hepatic mass noted. 3. Reversal of flow is noted in the portal vein. A portion of the portal vein appears to be thrombosed. The umbilical vein appears to be recanalized. 4.  Ascites. Electronically Signed   By: Maisie Fushomas  Register   On: 05/27/2020 06:15   US ABDOMEN LIMITED RUQ  Result Date: 05/27/2020 CLINICAL DATA:  Cirrhosis.  Ascites.  Cholelithiasis. EXAM: ULTRASOUND ABDOMEN LIMITED RIGHT UPPER QUADRANT COMPARISON:  CT 04/27/2020. FINDINGS: Gallbladder: 4.5 mm gallstone noted. Prominent amount of sludge noted. Prominent gallbladder wall thickening to 7.5 mm. This could be from hypoproteinemia and or cholecystitis. Common bile duct: Diameter: 3.6 mm Liver: Heterogeneous nodular parenchymal pattern consistent with cirrhosis. No mass noted. Reversal of flow is noted in the portal vein. A portion of the portal vein appears to be thrombosed. The umbilical vein appears to be recanalized. Other: Ascites. IMPRESSION: 1. 4.5 mm gallstone. Prominent amount of sludge. Gallbladder wall thickening to 7.5 mm. This could be from hypoproteinemia and or cholecystitis. No biliary distention. 2. Heterogeneous nodular hepatic parenchymal pattern consistent with cirrhosis. No hepatic mass noted. 3. Reversal of flow is noted in the portal vein. A portion of the portal vein appears to be thrombosed. The umbilical vein appears to be recanalized. 4.  Ascites. Electronically Signed   By: Maisie Fushomas  Register   On: 05/27/2020 06:15       LOS: 1 day   Osvaldo ShipperGokul Nastasha Reising  [facility] Pager on  [email or web address]  05/28/2020, 11:59 AM

## 2020-05-28 NOTE — Progress Notes (Signed)
Patients BP 75/54 and HR 82. Notified Manuela Schwartz the covering provider.

## 2020-05-28 NOTE — ED Notes (Signed)
Charge informed of floor's acceptance of report but refusal due to BP at this time

## 2020-05-29 LAB — COMPREHENSIVE METABOLIC PANEL
ALT: 20 U/L (ref 0–44)
AST: 61 U/L — ABNORMAL HIGH (ref 15–41)
Albumin: 2.6 g/dL — ABNORMAL LOW (ref 3.5–5.0)
Alkaline Phosphatase: 149 U/L — ABNORMAL HIGH (ref 38–126)
Anion gap: 9 (ref 5–15)
BUN: 20 mg/dL (ref 6–20)
CO2: 25 mmol/L (ref 22–32)
Calcium: 8.4 mg/dL — ABNORMAL LOW (ref 8.9–10.3)
Chloride: 94 mmol/L — ABNORMAL LOW (ref 98–111)
Creatinine, Ser: UNDETERMINED mg/dL (ref 0.44–1.00)
Glucose, Bld: 109 mg/dL — ABNORMAL HIGH (ref 70–99)
Potassium: 4.1 mmol/L (ref 3.5–5.1)
Sodium: 128 mmol/L — ABNORMAL LOW (ref 135–145)
Total Bilirubin: 30.3 mg/dL (ref 0.3–1.2)
Total Protein: 6 g/dL — ABNORMAL LOW (ref 6.5–8.1)

## 2020-05-29 LAB — CBC
HCT: 23.4 % — ABNORMAL LOW (ref 36.0–46.0)
Hemoglobin: 8.6 g/dL — ABNORMAL LOW (ref 12.0–15.0)
MCH: 35.8 pg — ABNORMAL HIGH (ref 26.0–34.0)
MCHC: 36.8 g/dL — ABNORMAL HIGH (ref 30.0–36.0)
MCV: 97.5 fL (ref 80.0–100.0)
Platelets: 106 10*3/uL — ABNORMAL LOW (ref 150–400)
RBC: 2.4 MIL/uL — ABNORMAL LOW (ref 3.87–5.11)
RDW: 15.4 % (ref 11.5–15.5)
WBC: 18.2 10*3/uL — ABNORMAL HIGH (ref 4.0–10.5)
nRBC: 0 % (ref 0.0–0.2)

## 2020-05-29 LAB — AMMONIA: Ammonia: 63 umol/L — ABNORMAL HIGH (ref 9–35)

## 2020-05-29 MED ORDER — FUROSEMIDE 10 MG/ML IJ SOLN
20.0000 mg | Freq: Once | INTRAMUSCULAR | Status: AC
  Administered 2020-05-29: 12:00:00 20 mg via INTRAVENOUS
  Filled 2020-05-29: qty 2

## 2020-05-29 MED ORDER — ALBUMIN HUMAN 25 % IV SOLN
25.0000 g | Freq: Once | INTRAVENOUS | Status: AC
  Administered 2020-05-29: 12:00:00 25 g via INTRAVENOUS
  Filled 2020-05-29: qty 100

## 2020-05-29 MED ORDER — MIDODRINE HCL 5 MG PO TABS
10.0000 mg | ORAL_TABLET | Freq: Three times a day (TID) | ORAL | Status: DC
  Administered 2020-05-29 – 2020-06-01 (×9): 10 mg via ORAL
  Filled 2020-05-29 (×8): qty 2

## 2020-05-29 NOTE — Progress Notes (Signed)
[facility] PROGRESS NOTE   Lindsey Huang YIR:[id number] DOB: 18-Mar-1981 DOA: 05/26/2020  PCP: System, Provider Not In  Brief History/Interval Summary: Lindsey Huang is a 39 y.o. female with a past medical history of alcoholic liver cirrhosis followed at [facility].  She apparently is also being followed by the transplant clinic.  Patient mentions that she has been taking her medications including furosemide 20 mg twice a day.  About a week or so ago she started noticing that her abdomen was getting more distended.  She started having a lot of pain in the abdomen which was 7 out of 10 in intensity.  Described as a dull achy pain.  No radiation.  No aggravating or relieving factors.  Some nausea but no vomiting.  No fever or chills.  She has had regular bowel movements.  She denies any lower extremity edema.  Due to worsening symptoms she decided to come to the emergency department.  In the emergency department she was found to have significant hyponatremia.  She was found to have significant ascites.  She underwent paracentesis.  Cell counts raised concern for SBP.  She was started on antibiotics.  She was also given 100 mL of 3% saline for hyponatremia.  [facility] as well as [facility] in [place] were contacted for transfer.  Subsequently the case was discussed with gastroenterology here who advised that patient may be treated in this facility.  Reason for Visit: Hyponatremia.  Alcoholic liver cirrhosis.  Consultants: None yet  Procedures: None  Antibiotics: Anti-infectives (From admission, onward)   Start     Dose/Rate Route Frequency Ordered Stop   05/28/20 1500  cefTRIAXone (ROCEPHIN) 2 g in sodium chloride 0.9 % 100 mL IVPB     Discontinue     2 g 200 mL/hr over 30 Minutes Intravenous Every 24 hours 05/28/20 1401     05/27/20 1400  ceFEPIme (MAXIPIME) 2 g in sodium chloride 0.9 % 100 mL IVPB  Status:  Discontinued        2 g 200 mL/hr over 30 Minutes  Intravenous Every 8 hours 05/27/20 1347 05/28/20 1401   05/26/20 2345  piperacillin-tazobactam (ZOSYN) IVPB 3.375 g        3.375 g 100 mL/hr over 30 Minutes Intravenous  Once 05/26/20 2342 05/27/20 0259      Subjective/Interval History: Patient states that she is feeling better.  She feels as if her abdomen is more distended today.  Pain has improved.  Denies nausea vomiting.  No dizziness or lightheadedness.    ROS: No chest pain or shortness of breath    Assessment/Plan:  Spontaneous bacterial peritonitis in a patient with a known history of alcoholic liver cirrhosis and portal hypertension Cefepime.  Changed over to ceftriaxone yesterday.  Follow-up on culture data.  Abdominal pain is improved.  WBC is slightly better today.    Hyponatremia and hypokalemia Hyponatremia was most likely due to hypervolemia from excessive fluid intake as well as liver disease.  She was placed on fluid restriction.  She was given dose of Lasix yesterday.  Sodium level has improved from 119 at admission to 8 today.  Continue to monitor. Urine osmolality was 126.  Urine potassium 12.  Urine sodium less than 10.  S Potassium level has also improved and is normal today.  Magnesium level was 2.1.    Hypotension Likely due to liver cirrhosis.  She does have low blood pressure at baseline to begin with.  She was started on midodrine the  dose of which has been increased.  Not particularly symptomatic.  Normocytic anemia No overt bleeding noted.  Hemoglobin is stable.  Transfuse if it goes below 7.  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  Stable.  History of alcoholic liver cirrhosis with portal hypertension/ascites/hyperbilirubinemia Patient is followed at [facility].  She is also followed by the transplant team there.  They were contacted yesterday for transfer however they did not have any beds available.   Acute decompensation likely due to SBP.  She is getting treated as discussed above.  INR is  also elevated.  Vitamin K is being given.  Continue with fluid restriction.  Continue with lactulose.  Ammonia level 63 although she is not overtly encephalopathic.   CT scan of the abdomen did not show any other acute findings.  No biliary ductal dilatation noted on ultrasound. She underwent paracentesis the time of admission.  Abdomen is a little bit more distended today.  Will give her more albumin and furosemide today.  Will try to avoid paracentesis with her low blood pressures.  May need to repeat it in a day or so.    DVT Prophylaxis: SCDs Code Status: Full code Family Communication: Discussed with the patient Disposition Plan:  Status is: Inpatient  Remains inpatient appropriate because:Persistent severe electrolyte disturbances and IV treatments appropriate due to intensity of illness or inability to take PO   Dispo:  Patient From: Home  Planned Disposition: Home  Expected discharge date: 05/30/20  Medically stable for discharge: No      Medications:  Scheduled: . lactulose  10 g Oral TID  . midodrine  10 mg Oral TID WC  . multivitamin with minerals  1 tablet Oral Daily  . thiamine  100 mg Oral Daily  . vitamin B-12  1,000 mcg Oral Daily   Continuous: . cefTRIAXone (ROCEPHIN)  IV Stopped (05/28/20 1636)  . phytonadione (VITAMIN K) IV 10 mg (05/29/20 1003)   JOI:NOMVEHMC injection, ondansetron **OR** ondansetron (ZOFRAN) IV   Objective:  Vital Signs  Vitals:   05/28/20 2109 05/29/20 0014 05/29/20 0543 05/29/20 0930  BP: 106/67 (!) 98/56 (!) 88/55 98/62  Pulse: 82 88 96 90  Resp:  15 16 16   Temp:  98.1 F (36.7 C) 98.2 F (36.8 C) 98.2 F (36.8 C)  TempSrc:  Oral Oral Oral  SpO2:  98% 98% 100%  Weight:      Height:        Intake/Output Summary (Last 24 hours) at 05/29/2020 1030 Last data filed at 05/29/2020 0500 Gross per 24 hour  Intake 147.6 ml  Output --  Net 147.6 ml   Filed Weights   05/26/20 2124  Weight: 59.9 kg    General appearance:  Awake alert.  In no distress Resp: Clear to auscultation bilaterally.  Normal effort Cardio: S1-S2 is normal regular.  No S3-S4.  No rubs murmurs or bruit GI: Abdomen is distended with tense ascites.  Nontender.   Extremities: No edema.  Full range of motion of lower extremities. Neurologic: Alert and oriented x3.  No focal neurological deficits.    Lab Results:  Data Reviewed: I have personally reviewed following labs and imaging studies  CBC: Recent Labs  Lab 05/26/20 2133 05/28/20 0326 05/29/20 0625  WBC 21.2*  21.6* 21.6* 18.2*  NEUTROABS 18.3*  --   --   HGB 9.2*  9.1* 7.8* 8.6*  HCT 24.8*  24.5* 21.6* 23.4*  MCV 98.0  97.2 99.1 97.5  PLT 84*  90* 94* 106*  Basic Metabolic Panel: Recent Labs  Lab 05/27/20 1546 05/27/20 1546 05/28/20 0326 05/28/20 0525 05/28/20 1128 05/28/20 1627 05/29/20 0625  NA 120*   < > 120* 122* 123* 125* 128*  K 2.5*   < > 2.4* 2.8* 3.0* 2.9* 4.1  CL 84*   < > 87* 86* 88* 89* 94*  CO2 25   < > 24 22 26 25 25   GLUCOSE 135*   < > 139* 108* 134* 110* 109*  BUN 25*   < > 27* 27* 24* 22* 20  CREATININE UNABLE TO REPORT DUE TO ICTERUS   < > UNABLE TO REPORT DUE TO ICTERUS UNABLE TO REPORT DUE TO ICTERUS UNABLE TO REPORT DUE TO ICTERUS UNABLE TO REPORT DUE TO ICTERUS  UNABLE TO REPORT DUE TO ICTERUS  CALCIUM 7.4*   < > 7.7* 8.0* 8.1* 8.2* 8.4*  MG 2.0  --  2.1  --   --   --   --    < > = values in this interval not displayed.    GFR: CrCl cannot be calculated (This lab value cannot be used to calculate CrCl because it is not a number: UNABLE TO REPORT DUE TO ICTERUS).  Liver Function Tests: Recent Labs  Lab 05/26/20 2133 05/26/20 2359 05/27/20 0751 05/28/20 0326 05/29/20 0625  AST 86* 78* 64* 56* 61*  ALT 29 22 22 24 20   ALKPHOS 175* 151* 131* 143* 149*  BILITOT 27.3* 29.6* 28.4* 28.6* 30.3*  PROT 6.4* 5.9* 5.3* 5.5* 6.0*  ALBUMIN 2.2* 2.1* 2.0* 2.3* 2.6*    Recent Labs  Lab 05/26/20 2133  LIPASE 33    Coagulation  Profile: Recent Labs  Lab 05/26/20 2338 05/28/20 0326  INR 3.0* 3.1*      Recent Results (from the past 240 hour(s))  SARS Coronavirus 2 by RT PCR (hospital order, performed in [facility] hospital lab) Nasopharyngeal Nasopharyngeal Swab     Status: None   Collection Time: 05/26/20 11:38 PM   Specimen: Nasopharyngeal Swab  Result Value Ref Range Status   SARS Coronavirus 2 NEGATIVE NEGATIVE Final    Comment: (NOTE) SARS-CoV-2 target nucleic acids are NOT DETECTED.  The SARS-CoV-2 RNA is generally detectable in upper and lower respiratory specimens during the acute phase of infection. The lowest concentration of SARS-CoV-2 viral copies this assay can detect is 250 copies / mL. A negative result does not preclude SARS-CoV-2 infection and should not be used as the sole basis for treatment or other patient management decisions.  A negative result may occur with improper specimen collection / handling, submission of specimen other than nasopharyngeal swab, presence of viral mutation(s) within the areas targeted by this assay, and inadequate number of viral copies (<250 copies / mL). A negative result must be combined with clinical observations, patient history, and epidemiological information.  Fact Sheet for Patients:   [facility]  Fact Sheet for Healthcare Providers: 07/27/20  This test is not yet approved or  cleared by the [employer] FDA and has been authorized for detection and/or diagnosis of SARS-CoV-2 by FDA under an Emergency Use Authorization (EUA).  This EUA will remain in effect (meaning this test can be used) for the duration of the COVID-19 declaration under Section 564(b)(1) of the Act, 21 U.S.C. section 360bbb-3(b)(1), unless the authorization is terminated or revoked sooner.  Performed at [facility], [address] [place], [address] [place]   Culture, blood (routine x 2)      Status: None (Preliminary result)  Collection Time: 05/26/20 11:59 PM   Specimen: Right Antecubital; Blood  Result Value Ref Range Status   Specimen Description RIGHT ANTECUBITAL  Final   Special Requests   Final    BOTTLES DRAWN AEROBIC AND ANAEROBIC Blood Culture adequate volume   Culture   Final    NO GROWTH 2 DAYS Performed at [facility], [address] [place], [place] [address]    Report Status PENDING  Incomplete  Culture, blood (routine x 2)     Status: None (Preliminary result)   Collection Time: 05/26/20 11:59 PM   Specimen: Right Antecubital; Blood  Result Value Ref Range Status   Specimen Description RIGHT ANTECUBITAL  Final   Special Requests   Final    BOTTLES DRAWN AEROBIC AND ANAEROBIC Blood Culture adequate volume   Culture   Final    NO GROWTH 2 DAYS Performed at [facility], [address] [place], [place] [address]    Report Status PENDING  Incomplete  Gram stain     Status: None   Collection Time: 05/27/20  9:40 AM   Specimen: PATH Cytology Peritoneal fluid  Result Value Ref Range Status   Specimen Description PERITONEAL  Final   Special Requests NONE  Final   Gram Stain   Final    WBC SEEN NO ORGANISMS SEEN Performed at [facility], [address] [place], [place] [address]    Report Status 05/27/2020 FINAL  Final  Body fluid culture     Status: None (Preliminary result)   Collection Time: 05/27/20  9:40 AM   Specimen: PATH Cytology Peritoneal fluid  Result Value Ref Range Status   Specimen Description   Final    PERITONEAL Performed at [facility], [address] [place], [place] [address]    Special Requests   Final    NONE Performed at [facility], [address] [place], [place] [address]    Gram Stain   Final    ABUNDANT WBC PRESENT,BOTH PMN AND MONONUCLEAR NO ORGANISMS SEEN    Culture   Final    NO GROWTH 2 DAYS Performed at [facility], [address]  [place], [place] [address]    Report Status PENDING  Incomplete      Radiology Studies: No results found.     LOS: 2 days   Antanisha Mohs Rito EhrlichKrishnan  [facility] Pager on [email or web address]  05/29/2020, 10:30 AM

## 2020-05-30 LAB — COMPREHENSIVE METABOLIC PANEL
ALT: 22 U/L (ref 0–44)
AST: 67 U/L — ABNORMAL HIGH (ref 15–41)
Albumin: 2.6 g/dL — ABNORMAL LOW (ref 3.5–5.0)
Alkaline Phosphatase: 148 U/L — ABNORMAL HIGH (ref 38–126)
Anion gap: 8 (ref 5–15)
BUN: 14 mg/dL (ref 6–20)
CO2: 25 mmol/L (ref 22–32)
Calcium: 8.3 mg/dL — ABNORMAL LOW (ref 8.9–10.3)
Chloride: 94 mmol/L — ABNORMAL LOW (ref 98–111)
Creatinine, Ser: UNDETERMINED mg/dL (ref 0.44–1.00)
Glucose, Bld: 135 mg/dL — ABNORMAL HIGH (ref 70–99)
Potassium: 3.5 mmol/L (ref 3.5–5.1)
Sodium: 127 mmol/L — ABNORMAL LOW (ref 135–145)
Total Bilirubin: 32.4 mg/dL (ref 0.3–1.2)
Total Protein: 6.1 g/dL — ABNORMAL LOW (ref 6.5–8.1)

## 2020-05-30 LAB — CBC
HCT: 23 % — ABNORMAL LOW (ref 36.0–46.0)
Hemoglobin: 8.4 g/dL — ABNORMAL LOW (ref 12.0–15.0)
MCH: 36.7 pg — ABNORMAL HIGH (ref 26.0–34.0)
MCHC: 36.5 g/dL — ABNORMAL HIGH (ref 30.0–36.0)
MCV: 100.4 fL — ABNORMAL HIGH (ref 80.0–100.0)
Platelets: 101 10*3/uL — ABNORMAL LOW (ref 150–400)
RBC: 2.29 MIL/uL — ABNORMAL LOW (ref 3.87–5.11)
RDW: 15.4 % (ref 11.5–15.5)
WBC: 17.5 10*3/uL — ABNORMAL HIGH (ref 4.0–10.5)
nRBC: 0 % (ref 0.0–0.2)

## 2020-05-30 LAB — BODY FLUID CULTURE: Culture: NO GROWTH

## 2020-05-30 MED ORDER — ALBUMIN HUMAN 25 % IV SOLN
25.0000 g | Freq: Once | INTRAVENOUS | Status: AC
  Administered 2020-05-30: 25 g via INTRAVENOUS
  Filled 2020-05-30: qty 100

## 2020-05-30 MED ORDER — HYDROMORPHONE HCL 1 MG/ML IJ SOLN
0.5000 mg | Freq: Four times a day (QID) | INTRAMUSCULAR | Status: DC | PRN
  Administered 2020-05-31 (×2): 0.5 mg via INTRAVENOUS
  Filled 2020-05-30 (×2): qty 1

## 2020-05-30 MED ORDER — POTASSIUM CHLORIDE CRYS ER 20 MEQ PO TBCR
40.0000 meq | EXTENDED_RELEASE_TABLET | Freq: Two times a day (BID) | ORAL | Status: AC
  Administered 2020-05-30 (×2): 40 meq via ORAL
  Filled 2020-05-30 (×2): qty 2

## 2020-05-30 MED ORDER — LACTULOSE 10 GM/15ML PO SOLN
10.0000 g | Freq: Two times a day (BID) | ORAL | Status: DC
  Administered 2020-05-31 – 2020-06-01 (×3): 10 g via ORAL
  Filled 2020-05-30 (×3): qty 30

## 2020-05-30 MED ORDER — OXYCODONE HCL 5 MG PO TABS
5.0000 mg | ORAL_TABLET | Freq: Four times a day (QID) | ORAL | Status: DC | PRN
  Administered 2020-05-30: 17:00:00 5 mg via ORAL
  Filled 2020-05-30 (×2): qty 1

## 2020-05-30 MED ORDER — FUROSEMIDE 10 MG/ML IJ SOLN
20.0000 mg | Freq: Once | INTRAMUSCULAR | Status: AC
  Administered 2020-05-30: 20 mg via INTRAVENOUS
  Filled 2020-05-30: qty 2

## 2020-05-30 NOTE — Progress Notes (Signed)
Transport called.

## 2020-05-30 NOTE — Progress Notes (Signed)
[facility] PROGRESS NOTE   Lindsey Huang HYW:[id number] DOB: 31-Jan-1981 DOA: 05/26/2020  PCP: System, Provider Not In  Brief History/Interval Summary: Lindsey Huang is a 39 y.o. female with a past medical history of alcoholic liver cirrhosis followed at [facility].  She apparently is also being followed by the transplant clinic.  Patient mentions that she has been taking her medications including furosemide 20 mg twice a day.  About a week or so ago she started noticing that her abdomen was getting more distended.  She started having a lot of pain in the abdomen which was 7 out of 10 in intensity.  Described as a dull achy pain.  No radiation.  No aggravating or relieving factors.  Some nausea but no vomiting.  No fever or chills.  She has had regular bowel movements.  She denies any lower extremity edema.  Due to worsening symptoms she decided to come to the emergency department.  In the emergency department she was found to have significant hyponatremia.  She was found to have significant ascites.  She underwent paracentesis.  Cell counts raised concern for SBP.  She was started on antibiotics.  She was also given 100 mL of 3% saline for hyponatremia.  [facility] as well as [facility] in [place] were contacted for transfer.  Subsequently the case was discussed with gastroenterology here who advised that patient may be treated in this facility.  Reason for Visit: Hyponatremia.  Alcoholic liver cirrhosis.  Consultants: None  Procedures: Paracentesis on 7/6  Antibiotics: Anti-infectives (From admission, onward)   Start     Dose/Rate Route Frequency Ordered Stop   05/28/20 1500  cefTRIAXone (ROCEPHIN) 2 g in sodium chloride 0.9 % 100 mL IVPB     Discontinue     2 g 200 mL/hr over 30 Minutes Intravenous Every 24 hours 05/28/20 1401     05/27/20 1400  ceFEPIme (MAXIPIME) 2 g in sodium chloride 0.9 % 100 mL IVPB  Status:  Discontinued        2 g 200 mL/hr over 30  Minutes Intravenous Every 8 hours 05/27/20 1347 05/28/20 1401   05/26/20 2345  piperacillin-tazobactam (ZOSYN) IVPB 3.375 g        3.375 g 100 mL/hr over 30 Minutes Intravenous  Once 05/26/20 2342 05/27/20 0259      Subjective/Interval History: Patient states that she is feeling better overall.  Still complaining of distended abdomen with pressure.  The pain that she was having initially appears to have improved.  No nausea vomiting.  No dizziness or lightheadedness.  Denies any shortness of breath.    Assessment/Plan:  Spontaneous bacterial peritonitis in a patient with a known history of alcoholic liver cirrhosis and portal hypertension Initially she was on cefepime which was changed over to ceftriaxone.  Cultures from ascitic fluid without any growth so far.  WBC appears to be improving.  If she continues to improve we will change her to oral antibiotics tomorrow.     Hyponatremia and hypokalemia Hyponatremia was most likely due to hypervolemia from excessive fluid intake as well as liver disease.  She was placed on fluid restriction.   She was given Lasix with albumin.  Sodium level improved from 119 at admission to 127 today.  Stable compared to yesterday.  Give another dose of Lasix with albumin today.    Urine osmolality was 126.  Urine potassium 12.  Urine sodium less than 10.  S Potassium was noted to be 3.5 and will be  repleted.  Check magnesium.  Hypotension Likely due to liver cirrhosis.  She does have low blood pressure at baseline to begin with.  Patient remains on midodrine.  Blood pressure appears to have improved.  She is asymptomatic.    Normocytic anemia No overt bleeding noted.  Hemoglobin is stable.  Transfuse if it goes below 7.  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  Stable.  History of alcoholic liver cirrhosis with portal hypertension/ascites/hyperbilirubinemia [facility].  She is also followed by the transplant team there.  They  were contacted yesterday for transfer however they did not have any beds available.   Acute decompensation likely due to SBP.  She is getting treated as discussed above.  INR is also elevated.  Vitamin K is being given.  Continue with fluid restriction.  Continue with lactulose.  Ammonia level 63 although she is not overtly encephalopathic.  Experiencing a lot of loose stools.  We will cut back on the dose of her lactulose. CT scan of the abdomen did not show any other acute findings.  No biliary ductal dilatation noted on ultrasound. She underwent paracentesis the time of admission.   Abdomen remains distended.  Plan for another paracentesis tomorrow.      DVT Prophylaxis: SCDs Code Status: Full code Family Communication: Discussed with the patient Disposition Plan:  Status is: Inpatient  Remains inpatient appropriate because:Persistent severe electrolyte disturbances and IV treatments appropriate due to intensity of illness or inability to take PO   Dispo:  Patient From: Home  Planned Disposition: Home  Expected discharge date: 05/31/20  Medically stable for discharge: No      Medications:  Scheduled: . [START ON 05/31/2020] lactulose  10 g Oral BID  . midodrine  10 mg Oral TID WC  . multivitamin with minerals  1 tablet Oral Daily  . thiamine  100 mg Oral Daily  . vitamin B-12  1,000 mcg Oral Daily   Continuous: . cefTRIAXone (ROCEPHIN)  IV 2 g (05/29/20 1725)  . phytonadione (VITAMIN K) IV Stopped (05/29/20 1103)   ZOX:WRUEAVWUPRN:morphine injection, ondansetron **OR** ondansetron (ZOFRAN) IV   Objective:  Vital Signs  Vitals:   05/29/20 1953 05/30/20 0031 05/30/20 0537 05/30/20 0843  BP: 110/71 102/62 (!) 91/57 (!) 100/55  Pulse: 87 91 90 88  Resp: 17 17 18 16   Temp: 97.6 F (36.4 C) 98 F (36.7 C) 98.1 F (36.7 C) 97.9 F (36.6 C)  TempSrc: Oral Oral Oral Oral  SpO2: 100% 99% 98% 100%  Weight:      Height:        Intake/Output Summary (Last 24 hours) at 05/30/2020  1025 Last data filed at 05/29/2020 1500 Gross per 24 hour  Intake 152.57 ml  Output --  Net 152.57 ml   Filed Weights   05/26/20 2124  Weight: 59.9 kg    General appearance: Awake alert.  In no distress.  Icteric sclera Resp: Clear to auscultation bilaterally.  Normal effort Cardio: S1-S2 is normal regular.  No S3-S4.  No rubs murmurs or bruit GI: Abdomen is distended with tense ascites.  Nontender.   Extremities: No edema.  Full range of motion of lower extremities. Neurologic: Alert and oriented x3.  No focal neurological deficits.     Lab Results:  Data Reviewed: I have personally reviewed following labs and imaging studies  CBC: Recent Labs  Lab 05/26/20 2133 05/28/20 0326 05/29/20 0625 05/30/20 0808  WBC 21.2*  21.6* 21.6* 18.2* 17.5*  NEUTROABS 18.3*  --   --   --  HGB 9.2*  9.1* 7.8* 8.6* 8.4*  HCT 24.8*  24.5* 21.6* 23.4* 23.0*  MCV 98.0  97.2 99.1 97.5 100.4*  PLT 84*  90* 94* 106* 101*    Basic Metabolic Panel: Recent Labs  Lab 05/27/20 1546 05/27/20 1546 05/28/20 0326 05/28/20 0326 05/28/20 0525 05/28/20 1128 05/28/20 1627 05/29/20 0625 05/30/20 0808  NA 120*   < > 120*   < > 122* 123* 125* 128* 127*  K 2.5*   < > 2.4*   < > 2.8* 3.0* 2.9* 4.1 3.5  CL 84*   < > 87*   < > 86* 88* 89* 94* 94*  CO2 25   < > 24   < > 22 26 25 25 25   GLUCOSE 135*   < > 139*   < > 108* 134* 110* 109* 135*  BUN 25*   < > 27*   < > 27* 24* 22* 20 14  CREATININE UNABLE TO REPORT DUE TO ICTERUS   < > UNABLE TO REPORT DUE TO ICTERUS   < > UNABLE TO REPORT DUE TO ICTERUS UNABLE TO REPORT DUE TO ICTERUS UNABLE TO REPORT DUE TO ICTERUS  UNABLE TO REPORT DUE TO ICTERUS UNABLE TO REPORT DUE TO ICTERUS.PMF  CALCIUM 7.4*   < > 7.7*   < > 8.0* 8.1* 8.2* 8.4* 8.3*  MG 2.0  --  2.1  --   --   --   --   --   --    < > = values in this interval not displayed.    GFR: CrCl cannot be calculated (This lab value cannot be used to calculate CrCl because it is not a number: UNABLE  TO REPORT DUE TO ICTERUS.PMF).  Liver Function Tests: Recent Labs  Lab 05/26/20 2359 05/27/20 0751 05/28/20 0326 05/29/20 0625 05/30/20 0808  AST 78* 64* 56* 61* 67*  ALT 22 22 24 20 22   ALKPHOS 151* 131* 143* 149* 148*  BILITOT 29.6* 28.4* 28.6* 30.3* 32.4*  PROT 5.9* 5.3* 5.5* 6.0* 6.1*  ALBUMIN 2.1* 2.0* 2.3* 2.6* 2.6*    Recent Labs  Lab 05/26/20 2133  LIPASE 33    Coagulation Profile: Recent Labs  Lab 05/26/20 2338 05/28/20 0326  INR 3.0* 3.1*      Recent Results (from the past 240 hour(s))  SARS Coronavirus 2 by RT PCR (hospital order, performed in [facility] hospital lab) Nasopharyngeal Nasopharyngeal Swab     Status: None   Collection Time: 05/26/20 11:38 PM   Specimen: Nasopharyngeal Swab  Result Value Ref Range Status   SARS Coronavirus 2 NEGATIVE NEGATIVE Final    Comment: (NOTE) SARS-CoV-2 target nucleic acids are NOT DETECTED.  The SARS-CoV-2 RNA is generally detectable in upper and lower respiratory specimens during the acute phase of infection. The lowest concentration of SARS-CoV-2 viral copies this assay can detect is 250 copies / mL. A negative result does not preclude SARS-CoV-2 infection and should not be used as the sole basis for treatment or other patient management decisions.  A negative result may occur with improper specimen collection / handling, submission of specimen other than nasopharyngeal swab, presence of viral mutation(s) within the areas targeted by this assay, and inadequate number of viral copies (<250 copies / mL). A negative result must be combined with clinical observations, patient history, and epidemiological information.  Fact Sheet for Patients:   [facility]  Fact Sheet for Healthcare Providers: 07/27/20  This test is not yet approved or  cleared by the  [employer] and has been authorized for detection and/or diagnosis of SARS-CoV-2 by FDA  under an TEFL teacher (EUA).  This EUA will remain in effect (meaning this test can be used) for the duration of the COVID-19 declaration under Section 564(b)(1) of the Act, 21 U.S.C. section 360bbb-3(b)(1), unless the authorization is terminated or revoked sooner.  Performed at [facility], [address] [place], [place] [address]   Culture, blood (routine x 2)     Status: None (Preliminary result)   Collection Time: 05/26/20 11:59 PM   Specimen: Right Antecubital; Blood  Result Value Ref Range Status   Specimen Description RIGHT ANTECUBITAL  Final   Special Requests   Final    BOTTLES DRAWN AEROBIC AND ANAEROBIC Blood Culture adequate volume   Culture   Final    NO GROWTH 3 DAYS Performed at [facility], [address], [place], [place] [address]    Report Status PENDING  Incomplete  Culture, blood (routine x 2)     Status: None (Preliminary result)   Collection Time: 05/26/20 11:59 PM   Specimen: Right Antecubital; Blood  Result Value Ref Range Status   Specimen Description RIGHT ANTECUBITAL  Final   Special Requests   Final    BOTTLES DRAWN AEROBIC AND ANAEROBIC Blood Culture adequate volume   Culture   Final    NO GROWTH 3 DAYS Performed at [facility], [address], [place], [place] [address]    Report Status PENDING  Incomplete  Gram stain     Status: None   Collection Time: 05/27/20  9:40 AM   Specimen: PATH Cytology Peritoneal fluid  Result Value Ref Range Status   Specimen Description PERITONEAL  Final   Special Requests NONE  Final   Gram Stain   Final    WBC SEEN NO ORGANISMS SEEN Performed at [facility], [address] [place], [place] [address]    Report Status 05/27/2020 FINAL  Final  Body fluid culture     Status: None (Preliminary result)   Collection Time: 05/27/20  9:40 AM   Specimen: PATH Cytology Peritoneal fluid  Result Value Ref Range Status   Specimen Description   Final     PERITONEAL Performed at [facility], [address] [place], [place] [address]    Special Requests   Final    NONE Performed at [facility], [address] [place], [place] [address]    Gram Stain   Final    ABUNDANT WBC PRESENT,BOTH PMN AND MONONUCLEAR NO ORGANISMS SEEN    Culture   Final    NO GROWTH 2 DAYS Performed at [facility], [address], [place], [place] [address]    Report Status PENDING  Incomplete      Radiology Studies: No results found.     LOS: 3 days   Voyd [employer] on [email or web address]  05/30/2020, 10:25 AM

## 2020-05-30 NOTE — Progress Notes (Signed)
Pt refused to be transferred to [facility], Dr Rito Ehrlich aware, transport cancelled, Josh RN at [facility] notified that pt doesn't want to be transferred

## 2020-05-30 NOTE — Progress Notes (Signed)
Report called to [employer] RN [facility] [address]

## 2020-05-31 ENCOUNTER — Inpatient Hospital Stay: Payer: Medicaid Other

## 2020-05-31 LAB — COMPREHENSIVE METABOLIC PANEL
ALT: 22 U/L (ref 0–44)
AST: 67 U/L — ABNORMAL HIGH (ref 15–41)
Albumin: 2.9 g/dL — ABNORMAL LOW (ref 3.5–5.0)
Alkaline Phosphatase: 146 U/L — ABNORMAL HIGH (ref 38–126)
Anion gap: 7 (ref 5–15)
BUN: 11 mg/dL (ref 6–20)
CO2: 26 mmol/L (ref 22–32)
Calcium: 8.6 mg/dL — ABNORMAL LOW (ref 8.9–10.3)
Chloride: 93 mmol/L — ABNORMAL LOW (ref 98–111)
Creatinine, Ser: UNDETERMINED mg/dL (ref 0.44–1.00)
Glucose, Bld: 124 mg/dL — ABNORMAL HIGH (ref 70–99)
Potassium: 3.8 mmol/L (ref 3.5–5.1)
Sodium: 126 mmol/L — ABNORMAL LOW (ref 135–145)
Total Bilirubin: 33.7 mg/dL (ref 0.3–1.2)
Total Protein: 6.1 g/dL — ABNORMAL LOW (ref 6.5–8.1)

## 2020-05-31 LAB — MAGNESIUM: Magnesium: 2 mg/dL (ref 1.7–2.4)

## 2020-05-31 LAB — CBC
HCT: 22.6 % — ABNORMAL LOW (ref 36.0–46.0)
Hemoglobin: 8 g/dL — ABNORMAL LOW (ref 12.0–15.0)
MCH: 36.7 pg — ABNORMAL HIGH (ref 26.0–34.0)
MCHC: 35.4 g/dL (ref 30.0–36.0)
MCV: 103.7 fL — ABNORMAL HIGH (ref 80.0–100.0)
Platelets: 93 10*3/uL — ABNORMAL LOW (ref 150–400)
RBC: 2.18 MIL/uL — ABNORMAL LOW (ref 3.87–5.11)
RDW: 15.3 % (ref 11.5–15.5)
WBC: 17.7 10*3/uL — ABNORMAL HIGH (ref 4.0–10.5)
nRBC: 0 % (ref 0.0–0.2)

## 2020-05-31 MED ORDER — FUROSEMIDE 10 MG/ML IJ SOLN
20.0000 mg | Freq: Once | INTRAMUSCULAR | Status: AC
  Administered 2020-05-31: 18:00:00 20 mg via INTRAVENOUS
  Filled 2020-05-31: qty 2

## 2020-05-31 MED ORDER — POTASSIUM CHLORIDE CRYS ER 20 MEQ PO TBCR
40.0000 meq | EXTENDED_RELEASE_TABLET | Freq: Two times a day (BID) | ORAL | Status: AC
  Administered 2020-05-31 (×2): 40 meq via ORAL
  Filled 2020-05-31 (×2): qty 2

## 2020-05-31 MED ORDER — ALBUMIN HUMAN 25 % IV SOLN
50.0000 g | Freq: Once | INTRAVENOUS | Status: AC
  Administered 2020-05-31: 12:00:00 50 g via INTRAVENOUS
  Filled 2020-05-31: qty 200

## 2020-05-31 NOTE — Progress Notes (Signed)
[facility] PROGRESS NOTE   Lindsey Huang HDQ:[id number] DOB: 08/14/1981 DOA: 05/26/2020  PCP: System, Provider Not In  Brief History/Interval Summary: Lindsey Huang is a 39 y.o. female with a past medical history of alcoholic liver cirrhosis followed at [facility].  She apparently is also being followed by the transplant clinic.  Patient mentions that she has been taking her medications including furosemide 20 mg twice a day.  About a week or so ago she started noticing that her abdomen was getting more distended.  She started having a lot of pain in the abdomen which was 7 out of 10 in intensity.  Described as a dull achy pain.  No radiation.  No aggravating or relieving factors.  Some nausea but no vomiting.  No fever or chills.  She has had regular bowel movements.  She denies any lower extremity edema.  Due to worsening symptoms she decided to come to the emergency department.  In the emergency department she was found to have significant hyponatremia.  She was found to have significant ascites.  She underwent paracentesis.  Cell counts raised concern for SBP.  She was started on antibiotics.  She was also given 100 mL of 3% saline for hyponatremia.  [facility] as well as [facility] in [place] were contacted for transfer.  Subsequently the case was discussed with gastroenterology here who advised that patient may be treated in this facility.  Reason for Visit: Hyponatremia.  Alcoholic liver cirrhosis.  Consultants: None  Procedures: Paracentesis on 7/6  Antibiotics: Anti-infectives (From admission, onward)   Start     Dose/Rate Route Frequency Ordered Stop   05/28/20 1500  cefTRIAXone (ROCEPHIN) 2 g in sodium chloride 0.9 % 100 mL IVPB     Discontinue     2 g 200 mL/hr over 30 Minutes Intravenous Every 24 hours 05/28/20 1401     05/27/20 1400  ceFEPIme (MAXIPIME) 2 g in sodium chloride 0.9 % 100 mL IVPB  Status:  Discontinued        2 g 200 mL/hr over 30  Minutes Intravenous Every 8 hours 05/27/20 1347 05/28/20 1401   05/26/20 2345  piperacillin-tazobactam (ZOSYN) IVPB 3.375 g        3.375 g 100 mL/hr over 30 Minutes Intravenous  Once 05/26/20 2342 05/27/20 0259      Subjective/Interval History: Patient states that she is feeling better.  Her main complaint is discomfort in her abdomen from the distention.  Otherwise she denies type of pain that she was experiencing previously.  No dizziness or lightheadedness.  Assessment/Plan:  Spontaneous bacterial peritonitis in a patient with a known history of alcoholic liver cirrhosis and portal hypertension Initially she was on cefepime which was changed over to ceftriaxone.  Cultures from ascitic fluid without any growth so far.  WBC has been improving.  She has been afebrile.  Usually a 5-day course of antibiotic is sufficient.  Today she will complete the 5 days.  We will leave her on the ceftriaxone for now.    Hyponatremia and hypokalemia Hyponatremia was most likely due to hypervolemia from excessive fluid intake as well as liver disease.  She was placed on fluid restriction.   She was given Lasix with albumin.   Sodium level was 119 at admission.  Now has been in the 120s for the last few days.  Continue fluid restriction.  Will give additional dose of Lasix with albumin later today after she has had her paracentesis.    Urine osmolality  was 126.  Urine potassium 12.  Urine sodium less than 10.   Potassium is better this morning.  Magnesium is 2.0.  Additional dose of potassium later today.  Hypotension Likely due to liver cirrhosis.  She does have low blood pressure at baseline to begin with.  Patient was started on midodrine with stabilization of her blood pressure.  Continue to monitor.  She remains asymptomatic.  Normocytic anemia No overt bleeding noted.  Hemoglobin is stable.  Transfuse if it goes below 7.  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  Stable.  History of alcoholic  liver cirrhosis with portal hypertension/ascites/hyperbilirubinemia Patient is followed at [facility].  She is also followed by the transplant team there.  They were contacted for transfer however they did not have any beds available.   Acute decompensation likely due to SBP.  She is getting treated as discussed above.  INR is also elevated.  Vitamin K is being given.  Continue with fluid restriction.  Continue with lactulose.  Ammonia level 63 although she is not overtly encephalopathic.  Dose had to be reduced due to more than 4 loose stools per day.  CT scan of the abdomen did not show any other acute findings.  No biliary ductal dilatation noted on ultrasound. She underwent paracentesis the time of admission.   Paracentesis today.  Bilirubin remains elevated.  She was told that she will need to follow-up with her hepatologist in the next 1 to 2 weeks. [facility] did call with bed availability yesterday but the patient refused transfer.   DVT Prophylaxis: SCDs Code Status: Full code Family Communication: Discussed with the patient Disposition Plan:  Status is: Inpatient  Remains inpatient appropriate because:Persistent severe electrolyte disturbances and IV treatments appropriate due to intensity of illness or inability to take PO   Dispo:  Patient From: Home  Planned Disposition: Home  Expected discharge date: 06/01/20  Medically stable for discharge: No      Medications:  Scheduled: . lactulose  10 g Oral BID  . midodrine  10 mg Oral TID WC  . multivitamin with minerals  1 tablet Oral Daily  . thiamine  100 mg Oral Daily  . vitamin B-12  1,000 mcg Oral Daily   Continuous: . albumin human    . cefTRIAXone (ROCEPHIN)  IV Stopped (05/30/20 1608)   GMW:NUUVOZDGUYQIH (DILAUDID) injection, ondansetron **OR** ondansetron (ZOFRAN) IV, oxyCODONE   Objective:  Vital Signs  Vitals:   05/31/20 0026 05/31/20 0439 05/31/20 0500 05/31/20 0845  BP: 100/68 93/65  105/66  Pulse: 80 83   89  Resp: 18 17  18   Temp: 98.2 F (36.8 C) 98 F (36.7 C)  97.8 F (36.6 C)  TempSrc: Oral Oral    SpO2: 99% 99%  100%  Weight:   82.8 kg   Height:        Intake/Output Summary (Last 24 hours) at 05/31/2020 1042 Last data filed at 05/31/2020 0000 Gross per 24 hour  Intake 448 ml  Output --  Net 448 ml   Filed Weights   05/26/20 2124 05/31/20 0500  Weight: 59.9 kg 82.8 kg    General appearance: Awake alert.  In no distress Resp: Clear to auscultation bilaterally.  Normal effort Cardio: S1-S2 is normal regular.  No S3-S4.  No rubs murmurs or bruit GI: Abdomen is distended with tense ascites.  Nontender.  No masses organomegaly appreciated although it was a difficult exam.   Extremities: No edema.  Full range of motion of lower extremities. Neurologic:  Alert and oriented x3.  No focal neurological deficits.     Lab Results:  Data Reviewed: I have personally reviewed following labs and imaging studies  CBC: Recent Labs  Lab 05/26/20 2133 05/28/20 0326 05/29/20 0625 05/30/20 0808 05/31/20 0405  WBC 21.2*  21.6* 21.6* 18.2* 17.5* 17.7*  NEUTROABS 18.3*  --   --   --   --   HGB 9.2*  9.1* 7.8* 8.6* 8.4* 8.0*  HCT 24.8*  24.5* 21.6* 23.4* 23.0* 22.6*  MCV 98.0  97.2 99.1 97.5 100.4* 103.7*  PLT 84*  90* 94* 106* 101* 93*    Basic Metabolic Panel: Recent Labs  Lab 05/27/20 1546 05/27/20 1546 05/28/20 0326 05/28/20 0525 05/28/20 1128 05/28/20 1627 05/29/20 0625 05/30/20 0808 05/31/20 0405  NA 120*   < > 120*   < > 123* 125* 128* 127* 126*  K 2.5*   < > 2.4*   < > 3.0* 2.9* 4.1 3.5 3.8  CL 84*   < > 87*   < > 88* 89* 94* 94* 93*  CO2 25   < > 24   < > 26 25 25 25 26   GLUCOSE 135*   < > 139*   < > 134* 110* 109* 135* 124*  BUN 25*   < > 27*   < > 24* 22* 20 14 11   CREATININE UNABLE TO REPORT DUE TO ICTERUS   < > UNABLE TO REPORT DUE TO ICTERUS   < > UNABLE TO REPORT DUE TO ICTERUS UNABLE TO REPORT DUE TO ICTERUS  UNABLE TO REPORT DUE TO ICTERUS UNABLE TO  REPORT DUE TO ICTERUS.PMF UNABLE TO REPORT DUE TO ICTERUS  CALCIUM 7.4*   < > 7.7*   < > 8.1* 8.2* 8.4* 8.3* 8.6*  MG 2.0  --  2.1  --   --   --   --   --  2.0   < > = values in this interval not displayed.    GFR: CrCl cannot be calculated (This lab value cannot be used to calculate CrCl because it is not a number: UNABLE TO REPORT DUE TO ICTERUS).  Liver Function Tests: Recent Labs  Lab 05/27/20 0751 05/28/20 0326 05/29/20 0625 05/30/20 0808 05/31/20 0405  AST 64* 56* 61* 67* 67*  ALT 22 24 20 22 22   ALKPHOS 131* 143* 149* 148* 146*  BILITOT 28.4* 28.6* 30.3* 32.4* 33.7*  PROT 5.3* 5.5* 6.0* 6.1* 6.1*  ALBUMIN 2.0* 2.3* 2.6* 2.6* 2.9*    Recent Labs  Lab 05/26/20 2133  LIPASE 33    Coagulation Profile: Recent Labs  Lab 05/26/20 2338 05/28/20 0326  INR 3.0* 3.1*      Recent Results (from the past 240 hour(s))  SARS Coronavirus 2 by RT PCR (hospital order, performed [facility]) Nasopharyngeal Nasopharyngeal Swab     Status: None   Collection Time: 05/26/20 11:38 PM   Specimen: Nasopharyngeal Swab  Result Value Ref Range Status   SARS Coronavirus 2 NEGATIVE NEGATIVE Final    Comment: (NOTE) SARS-CoV-2 target nucleic acids are NOT DETECTED.  The SARS-CoV-2 RNA is generally detectable in upper and lower respiratory specimens during the acute phase of infection. The lowest concentration of SARS-CoV-2 viral copies this assay can detect is 250 copies / mL. A negative result does not preclude SARS-CoV-2 infection and should not be used as the sole basis for treatment or other patient management decisions.  A negative result may occur with improper specimen collection /  handling, submission of specimen other than nasopharyngeal swab, presence of viral mutation(s) within the areas targeted by this assay, and inadequate number of viral copies (<250 copies / mL). A negative result must be combined with clinical observations, patient history, and  epidemiological information.  Fact Sheet for Patients:   BoilerBrush.com.cy  Fact Sheet for Healthcare Providers: https://pope.com/  This test is not yet approved or  cleared by the [place] FDA and has been authorized for detection and/or diagnosis of SARS-CoV-2 by FDA under an Emergency Use Authorization (EUA).  This EUA will remain in effect (meaning this test can be used) for the duration of the COVID-19 declaration under Section 564(b)(1) of the Act, 21 U.S.C. section 360bbb-3(b)(1), unless the authorization is terminated or revoked sooner.  Performed at [facility], [address] [place], [place] [address]   Culture, blood (routine x 2)     Status: None (Preliminary result)   Collection Time: 05/26/20 11:59 PM   Specimen: Right Antecubital; Blood  Result Value Ref Range Status   Specimen Description RIGHT ANTECUBITAL  Final   Special Requests   Final    BOTTLES DRAWN AEROBIC AND ANAEROBIC Blood Culture adequate volume   Culture   Final    NO GROWTH 4 DAYS Performed at [facility], [address], [place], [place] [address]    Report Status PENDING  Incomplete  Culture, blood (routine x 2)     Status: None (Preliminary result)   Collection Time: 05/26/20 11:59 PM   Specimen: Right Antecubital; Blood  Result Value Ref Range Status   Specimen Description RIGHT ANTECUBITAL  Final   Special Requests   Final    BOTTLES DRAWN AEROBIC AND ANAEROBIC Blood Culture adequate volume   Culture   Final    NO GROWTH 4 DAYS Performed at [facility], [address], [place], [place] [address]    Report Status PENDING  Incomplete  Gram stain     Status: None   Collection Time: 05/27/20  9:40 AM   Specimen: PATH Cytology Peritoneal fluid  Result Value Ref Range Status   Specimen Description PERITONEAL  Final   Special Requests NONE  Final   Gram Stain   Final    WBC SEEN NO ORGANISMS  SEEN Performed at [facility], [address] [place], [place] [address]    Report Status 05/27/2020 FINAL  Final  Body fluid culture     Status: None   Collection Time: 05/27/20  9:40 AM   Specimen: PATH Cytology Peritoneal fluid  Result Value Ref Range Status   Specimen Description   Final    PERITONEAL Performed at [facility], [address], [place], [place] [address]    Special Requests   Final    NONE Performed at [facility], [address] [place], [place] [address]    Gram Stain   Final    ABUNDANT WBC PRESENT,BOTH PMN AND MONONUCLEAR NO ORGANISMS SEEN    Culture   Final    NO GROWTH Performed at [facility], [address] [place], [place] [address]    Report Status 05/30/2020 FINAL  Final      Radiology Studies: No results found.     LOS: 4 days   Syndi Pua [employer] on [email or web address]  05/31/2020, 10:42 AM

## 2020-05-31 NOTE — Procedures (Signed)
PROCEDURE SUMMARY:  Successful US guided paracentesis from right lateral abdomen.  Yielded 3.725 liters of clear yellow fluid.  No immediate complications.  Patient tolerated well.  EBL = trace   Adryanna Friedt S Abdulah Iqbal PA-C 05/31/2020 2:34 PM

## 2020-06-01 LAB — CBC
HCT: 21.4 % — ABNORMAL LOW (ref 36.0–46.0)
Hemoglobin: 7.5 g/dL — ABNORMAL LOW (ref 12.0–15.0)
MCH: 36.2 pg — ABNORMAL HIGH (ref 26.0–34.0)
MCHC: 35 g/dL (ref 30.0–36.0)
MCV: 103.4 fL — ABNORMAL HIGH (ref 80.0–100.0)
Platelets: 88 10*3/uL — ABNORMAL LOW (ref 150–400)
RBC: 2.07 MIL/uL — ABNORMAL LOW (ref 3.87–5.11)
RDW: 15.6 % — ABNORMAL HIGH (ref 11.5–15.5)
WBC: 15.9 10*3/uL — ABNORMAL HIGH (ref 4.0–10.5)
nRBC: 0 % (ref 0.0–0.2)

## 2020-06-01 LAB — CULTURE, BLOOD (ROUTINE X 2)
Culture: NO GROWTH
Culture: NO GROWTH
Special Requests: ADEQUATE
Special Requests: ADEQUATE

## 2020-06-01 LAB — COMPREHENSIVE METABOLIC PANEL
ALT: 18 U/L (ref 0–44)
AST: 58 U/L — ABNORMAL HIGH (ref 15–41)
Albumin: 3.1 g/dL — ABNORMAL LOW (ref 3.5–5.0)
Alkaline Phosphatase: 131 U/L — ABNORMAL HIGH (ref 38–126)
Anion gap: 7 (ref 5–15)
BUN: 11 mg/dL (ref 6–20)
CO2: 25 mmol/L (ref 22–32)
Calcium: 8.4 mg/dL — ABNORMAL LOW (ref 8.9–10.3)
Chloride: 99 mmol/L (ref 98–111)
Creatinine, Ser: UNDETERMINED mg/dL (ref 0.44–1.00)
Glucose, Bld: 90 mg/dL (ref 70–99)
Potassium: 4.8 mmol/L (ref 3.5–5.1)
Sodium: 131 mmol/L — ABNORMAL LOW (ref 135–145)
Total Bilirubin: 28 mg/dL (ref 0.3–1.2)
Total Protein: 6 g/dL — ABNORMAL LOW (ref 6.5–8.1)

## 2020-06-01 MED ORDER — POTASSIUM CHLORIDE ER 10 MEQ PO CPCR: 20.0000 meq | ORAL_CAPSULE | Freq: Every day | ORAL | 1 refills | Status: DC

## 2020-06-01 MED ORDER — MIDODRINE HCL 10 MG PO TABS: 10.0000 mg | ORAL_TABLET | Freq: Three times a day (TID) | ORAL | 1 refills | Status: DC

## 2020-06-01 MED ORDER — CIPROFLOXACIN HCL 500 MG PO TABS: 500.0000 mg | ORAL_TABLET | Freq: Two times a day (BID) | ORAL | 0 refills | Status: AC

## 2020-06-01 MED ORDER — LACTULOSE 10 GM/15ML PO SOLN: 10.0000 g | Freq: Two times a day (BID) | ORAL | 0 refills | Status: DC

## 2020-06-01 MED ORDER — FUROSEMIDE 20 MG PO TABS: 20.0000 mg | ORAL_TABLET | Freq: Every day | ORAL | 1 refills | Status: DC

## 2020-06-01 MED ORDER — OXYCODONE HCL 5 MG PO TABS: 5.0000 mg | ORAL_TABLET | Freq: Four times a day (QID) | ORAL | 0 refills | Status: DC | PRN

## 2020-06-01 NOTE — Discharge Instructions (Signed)
Paracentesis, Care After This sheet gives you information about how to care for yourself after your procedure. Your health care provider may also give you more specific instructions. If you have problems or questions, contact your health care provider. What can I expect after the procedure? After the procedure, it is common to have a small amount of clear fluid coming from the puncture site. Follow these instructions at home: Puncture site care   Follow instructions from your health care provider about how to take care of your puncture site. Make sure you: ? Wash your hands with soap and water before and after you change your bandage (dressing). If soap and water are not available, use hand sanitizer. ? Change your dressing as told by your health care provider.  Check your puncture area every day for signs of infection. Check for: ? Redness, swelling, or pain. ? More fluid or blood. ? Warmth. ? Pus or a bad smell. General instructions  Return to your normal activities as told by your health care provider. Ask your health care provider what activities are safe for you.  Take over-the-counter and prescription medicines only as told by your health care provider.  Do not take baths, swim, or use a hot tub until your health care provider approves. Ask your health care provider if you may take showers. You may only be allowed to take sponge baths.  Keep all follow-up visits as told by your health care provider. This is important. Contact a health care provider if:  You have redness, swelling, or pain at your puncture site.  You have more fluid or blood coming from your puncture site.  Your puncture site feels warm to the touch.  You have pus or a bad smell coming from your puncture site.  You have a fever. Get help right away if:  You have chest pain or shortness of breath.  You develop increasing pain, discomfort, or swelling in your abdomen.  You feel dizzy or light-headed or  you faint. Summary  After the procedure, it is common to have a small amount of clear fluid coming from the puncture site.  Follow instructions from your health care provider about how to take care of your puncture site.  Check your puncture area every day signs of infection.  Keep all follow-up visits as told by your health care provider. This information is not intended to replace advice given to you by your health care provider. Make sure you discuss any questions you have with your health care provider. Document Revised: 05/22/2019 Document Reviewed: 08/29/2018 Elsevier Patient Education  2020 Elsevier Inc.   Ascites  Ascites is a collection of too much fluid in the abdomen. Ascites can range from mild to severe. If ascites is not treated, it can get worse. What are the causes? This condition may be caused by:  A liver condition called cirrhosis. This is the most common cause of ascites.  Long-term (chronic) or alcoholic hepatitis.  Infection or inflammation in the abdomen.  Cancer in the abdomen.  Heart failure.  Kidney disease.  Inflammation of the pancreas.  Clots in the veins of the liver. What are the signs or symptoms? Symptoms of this condition include:  A feeling of fullness in the abdomen. This is common.  An increase in the size of the abdomen or waist.  Swelling in the legs.  Swelling of the scrotum (in men).  Difficulty breathing.  Pain in the abdomen.  Sudden weight gain. If the condition is mild,  you may not have symptoms. How is this diagnosed? This condition is diagnosed based on your medical history and a physical exam. Your health care provider may order imaging tests, such as an ultrasound or CT scan of your abdomen. How is this treated? Treatment for this condition depends on the cause of the ascites. It may include:  Taking a pill to make you urinate. This is called a water pill (diuretic pill).  Strictly reducing your salt  (sodium) intake. Salt can cause extra fluid to be kept (retained) in the body, and this makes ascites worse.  Having a procedure to remove fluid from your abdomen (paracentesis).  Having a procedure that connects two of the major veins within your liver and relieves pressure on your liver. This is called a TIPS procedure (transjugular intrahepatic portosystemic shunt procedure).  Placement of a drainage catheter (peritoneovenous shunt) to manage the extra fluid in the abdomen. Ascites may go away or improve when the condition that caused it is treated. Follow these instructions at home:  Keep track of your weight. To do this, weigh yourself at the same time every day and write down your weight.  Keep track of how much you drink and any changes in how much or how often you urinate.  Follow any instructions that your health care provider gives you about how much to drink.  Try not to eat salty (high-sodium) foods.  Take over-the-counter and prescription medicines only as told by your health care provider.  Keep all follow-up visits as told by your health care provider. This is important.  Report any changes in your health to your health care provider, especially if you develop new symptoms or your symptoms get worse. Contact a health care provider if:  You gain more than 3 lb (1.36 kg) in 3 days.  Your waist size increases.  You have new swelling in your legs.  The swelling in your legs gets worse. Get help right away if:  You have a fever.  You are confused.  You have new or worsening breathing trouble.  You have new or worsening pain in your abdomen.  You have new or worsening swelling in the scrotum (in men). Summary  Ascites is a collection of too much fluid in the abdomen.  Ascites may be caused by various conditions, such as cirrhosis, hepatitis, cancer, or congestive heart failure.  Symptoms may include swelling of the abdomen and other areas due to extra fluid  in the body.  Treatments may involve dietary changes, medicines, or procedures. This information is not intended to replace advice given to you by your health care provider. Make sure you discuss any questions you have with your health care provider. Document Revised: 10/10/2018 Document Reviewed: 07/21/2017 Elsevier Patient Education  2020 [employer].

## 2020-06-01 NOTE — Discharge Summary (Signed)
[facility]  Physician Discharge Summary   Patient ID: Lindsey Huang MRN: [id number] DOB/AGE: 1981/11/21 39 y.o.  Admit date: 05/26/2020 Discharge date: 06/01/2020  PCP: System, Provider Not In  DISCHARGE DIAGNOSES:  Spontaneous bacterial peritonitis, improved Known history of alcoholic liver cirrhosis and portal hypertension Ascites status post paracentesis Hyponatremia, improved Hypokalemia, improved Hypotension Normocytic anemia Thrombocytopenia  RECOMMENDATIONS FOR OUTPATIENT FOLLOW UP: 1. Patient told to follow-up with her liver specialist at [facility].    Home Health: None Equipment/Devices: None  CODE STATUS: Full code  DISCHARGE CONDITION: fair  Diet recommendation: As before  INITIAL HISTORY: Lindsey Hardinis a 39 y.o.femalewith a past medical history of alcoholic liver cirrhosis followed at [facility]. She apparently is also being followed by the transplant clinic. Patient mentions that she has been taking her medications including furosemide 20 mg twice a day. About a week or so ago she started noticing that her abdomen was getting more distended. She started having a lot of pain in the abdomen which was 7 out of 10 in intensity. Described as a dull achy pain. No radiation. No aggravating or relieving factors. Some nausea but no vomiting. No fever or chills. She has had regular bowel movements. She denies any lower extremity edema. Due to worsening symptoms she decided to come to the emergency department.  In the emergency department she was found to have significant hyponatremia. She was found to have significant ascites. She underwent paracentesis. Cell counts raised concern for SBP. She was started on antibiotics. She was also given 100 mL of 3% saline for hyponatremia. [facility] as well as [facility] in [place] were contacted for transfer. Subsequently the case was discussed with gastroenterology here who advised that  patient may be treated in this facility.  Consultations:  None  Procedures:  Paracentesis x2   HOSPITAL COURSE:   Spontaneous bacterial peritonitis in a patient with a known history of alcoholic liver cirrhosis and portal hypertension Patient presented with abdominal pain and distention.  She underwent paracentesis in the emergency department which showed elevated cell counts.  Started on antibiotics initially with cefepime.  Cefotaxime not available in this facility.  She was subsequently changed over to ceftriaxone.  Cultures did not show any growth.  She has completed 5 days of antibiotic treatment.  However her WBC remains elevated.  She is feeling better.  She will be discharged on ciprofloxacin.  She was asked to follow-up with her hepatologist at [facility].    Hyponatremia and hypokalemia Hyponatremia was most likely due to hypervolemia from excessive fluid intake as well as liver disease.  She was placed on fluid restriction.  Sodium level was 119 at admission. She was given Lasix with albumin.   Placed on fluid restriction.  Sodium level has improved to 131. Urine osmolality was 126.  Urine potassium 12.  Urine sodium less than 10.   Potassium was also repleted and is normal this morning.  Magnesium was 2.0 yesterday.    Hypotension Likely due to liver cirrhosis.  She does have low blood pressure at baseline to begin with.  Patient was started on midodrine with stabilization of her blood pressure.    She will be discharged on the same.  Normocytic anemia No overt bleeding noted.  Hemoglobin is stable.    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  Stable.  History of alcoholic liver cirrhosis with portal hypertension/ascites/hyperbilirubinemia Patient is followed at [facility].  She is also followed by the transplant team there.  They were contacted for  transfer however they did not have any beds available.    Subsequently a bed was available but patient declined  transfer. Acute decompensation likely due to SBP.  She is getting treated as discussed above.  INR is also elevated.  Her meld score is 33. She was given vitamin K.  She underwent paracentesis twice.  She was continued on lactulose.  Her ammonia level was 63 although she was not encephalopathic.  Bilirubin stable this morning.  She understands that she needs to follow-up with her hepatologist in [place]. She will be discharged on furosemide.  Ideally she should be on spironolactone as well but due to her low blood pressures we will hold off for now.  This can be managed further at [facility].  Obesity Estimated body mass index is 34.24 kg/m as calculated from the following:   Height as of this encounter:  (1.499 m).   Weight as of this encounter: 76.9 kg.   Overall stable.  Patient feels much better compared to admission.  She is able to walk around without difficulties.  Okay for discharge.   PERTINENT LABS:  The results of significant diagnostics from this hospitalization (including imaging, microbiology, ancillary and laboratory) are listed below for reference.    Microbiology: Recent Results (from the past 240 hour(s))  SARS Coronavirus 2 by RT PCR (hospital order, performed in [facility] hospital lab) Nasopharyngeal Nasopharyngeal Swab     Status: None   Collection Time: 05/26/20 11:38 PM   Specimen: Nasopharyngeal Swab  Result Value Ref Range Status   SARS Coronavirus 2 NEGATIVE NEGATIVE Final    Comment: (NOTE) SARS-CoV-2 target nucleic acids are NOT DETECTED.  The SARS-CoV-2 RNA is generally detectable in upper and lower respiratory specimens during the acute phase of infection. The lowest concentration of SARS-CoV-2 viral copies this assay can detect is 250 copies / mL. A negative result does not preclude SARS-CoV-2 infection and should not be used as the sole basis for treatment or other patient management decisions.  A negative result may occur with improper specimen  collection / handling, submission of specimen other than nasopharyngeal swab, presence of viral mutation(s) within the areas targeted by this assay, and inadequate number of viral copies (<250 copies / mL). A negative result must be combined with clinical observations, patient history, and epidemiological information.  Fact Sheet for Patients:   BoilerBrush.com.cy  Fact Sheet for Healthcare Providers: https://pope.com/  This test is not yet approved or  cleared by the [place] FDA and has been authorized for detection and/or diagnosis of SARS-CoV-2 by FDA under an Emergency Use Authorization (EUA).  This EUA will remain in effect (meaning this test can be used) for the duration of the COVID-19 declaration under Section 564(b)(1) of the Act, 21 U.S.C. section 360bbb-3(b)(1), unless the authorization is terminated or revoked sooner.  Performed at [facility], [address] [place], [place] [address]   Culture, blood (routine x 2)     Status: None   Collection Time: 05/26/20 11:59 PM   Specimen: Right Antecubital; Blood  Result Value Ref Range Status   Specimen Description RIGHT ANTECUBITAL  Final   Special Requests   Final    BOTTLES DRAWN AEROBIC AND ANAEROBIC Blood Culture adequate volume   Culture   Final    NO GROWTH 5 DAYS Performed at [facility], [address] [place], [place] [address]    Report Status 06/01/2020 FINAL  Final  Culture, blood (routine x 2)  Status: None   Collection Time: 05/26/20 11:59 PM   Specimen: Right Antecubital; Blood  Result Value Ref Range Status   Specimen Description RIGHT ANTECUBITAL  Final   Special Requests   Final    BOTTLES DRAWN AEROBIC AND ANAEROBIC Blood Culture adequate volume   Culture   Final    NO GROWTH 5 DAYS Performed at [facility], [address] [place], [place] [address]    Report Status 06/01/2020 FINAL  Final  Gram stain      Status: None   Collection Time: 05/27/20  9:40 AM   Specimen: PATH Cytology Peritoneal fluid  Result Value Ref Range Status   Specimen Description PERITONEAL  Final   Special Requests NONE  Final   Gram Stain   Final    WBC SEEN NO ORGANISMS SEEN Performed at [facility], [address], [place], [place] [address]    Report Status 05/27/2020 FINAL  Final  Body fluid culture     Status: None   Collection Time: 05/27/20  9:40 AM   Specimen: PATH Cytology Peritoneal fluid  Result Value Ref Range Status   Specimen Description   Final    PERITONEAL Performed at [facility], [address], [place], [place] [address]    Special Requests   Final    NONE Performed at [facility], [address] [place], [place] [address]    Gram Stain   Final    ABUNDANT WBC PRESENT,BOTH PMN AND MONONUCLEAR NO ORGANISMS SEEN    Culture   Final    NO GROWTH Performed at [facility], [address], [place], [place] [address]    Report Status 05/30/2020 FINAL  Final     Labs:   Basic Metabolic Panel: Recent Labs  Lab 05/27/20 1546 05/27/20 1546 05/28/20 0326 05/28/20 0525 05/28/20 1627 05/29/20 0625 05/30/20 0808 05/31/20 0405 06/01/20 0523  NA 120*   < > 120*   < > 125* 128* 127* 126* 131*  K 2.5*   < > 2.4*   < > 2.9* 4.1 3.5 3.8 4.8  CL 84*   < > 87*   < > 89* 94* 94* 93* 99  CO2 25   < > 24   < > 25 25 25 26 25   GLUCOSE 135*   < > 139*   < > 110* 109* 135* 124* 90  BUN 25*   < > 27*   < > 22* 20 14 11 11   CREATININE UNABLE TO REPORT DUE TO ICTERUS   < > UNABLE TO REPORT DUE TO ICTERUS   < > UNABLE TO REPORT DUE TO ICTERUS  UNABLE TO REPORT DUE TO ICTERUS UNABLE TO REPORT DUE TO ICTERUS.PMF UNABLE TO REPORT DUE TO ICTERUS UNABLE TO REPORT DUE TO ICTERUS  CALCIUM 7.4*   < > 7.7*   < > 8.2* 8.4* 8.3* 8.6* 8.4*  MG 2.0  --  2.1  --   --   --   --  2.0  --    < > = values in this interval not displayed.   Liver Function Tests: Recent Labs   Lab 05/28/20 0326 05/29/20 0625 05/30/20 0808 05/31/20 0405 06/01/20 0523  AST 56* 61* 67* 67* 58*  ALT 24 20 22 22 18   ALKPHOS 143* 149* 148* 146* 131*  BILITOT 28.6* 30.3* 32.4* 33.7* 28.0*  PROT 5.5* 6.0* 6.1* 6.1* 6.0*  ALBUMIN 2.3* 2.6* 2.6* 2.9* 3.1*   Recent Labs  Lab 05/26/20 2133  LIPASE 33   Recent Labs  Lab 05/29/20 0625  AMMONIA 63*   CBC: Recent Labs  Lab 05/26/20 2133 05/26/20 2133 05/28/20 0326 05/29/20 0625 05/30/20 0808 05/31/20 0405 06/01/20 0523  WBC 21.2*  21.6*   < > 21.6* 18.2* 17.5* 17.7* 15.9*  NEUTROABS 18.3*  --   --   --   --   --   --   HGB 9.2*  9.1*   < > 7.8* 8.6* 8.4* 8.0* 7.5*  HCT 24.8*  24.5*   < > 21.6* 23.4* 23.0* 22.6* 21.4*  MCV 98.0  97.2   < > 99.1 97.5 100.4* 103.7* 103.4*  PLT 84*  90*   < > 94* 106* 101* 93* 88*   < > = values in this interval not displayed.     IMAGING STUDIES CT Abdomen Pelvis W Contrast  Result Date: 05/27/2020 CLINICAL DATA:  Abdominal pain for several days with nausea, initial encounter EXAM: CT ABDOMEN AND PELVIS WITH CONTRAST TECHNIQUE: Multidetector CT imaging of the abdomen and pelvis was performed using the standard protocol following bolus administration of intravenous contrast. CONTRAST:  75mL OMNIPAQUE IOHEXOL 300 MG/ML  SOLN COMPARISON:  None. FINDINGS: Lower chest: No acute abnormality. Hepatobiliary: Some patchy areas of decreased attenuation are noted within the liver. Underlying metastatic disease could not be totally excluded. These changes could be related to underlying cirrhosis. Recanalization of the umbilical vein is noted. Generalized ascites is seen. Gallbladder demonstrates dependent density consistent with small stones. Pancreas: Pancreas is within normal limits. Spleen: Spleen is enlarged without focal mass consistent with underlying portal hypertension. Adrenals/Urinary Tract: Adrenal glands are within normal limits. Kidneys demonstrate a normal enhancement pattern. No calculi  are seen. Delayed images demonstrate no significant excretion of contrast which may be related to some underlying renal failure. Stomach/Bowel: Colon is well visualized. Diverticular changes noted without diverticulitis. No obstructive or inflammatory changes are seen. The appendix is not well visualized. Small bowel is unremarkable. Stomach is within normal limits. 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 Vascular/Lymphatic: Aortic atherosclerosis. No enlarged abdominal or pelvic lymph nodes. Reproductive: Uterus is within normal limits. Prominence of the ovarian veins is seen consistent with some pelvic varices. No adnexal mass is noted. Other: Moderate ascites is seen consistent with the underlying cirrhosis. Musculoskeletal: No acute or significant osseous findings. IMPRESSION: Changes consistent with cirrhosis of the liver with portal hypertension. Associated splenomegaly, ascites and varices are noted. Multiple areas of decreased attenuation within the liver incompletely evaluated on this exam. These may represent changes of cirrhosis and regenerating nodules. Underlying neoplasm could not be totally excluded. Cholelithiasis. Diverticulosis without diverticulitis. Electronically Signed   By: Alcide Clever M.D.   On: 05/27/2020 02:20   US Paracentesis  Result Date: 05/31/2020 INDICATION: Ascites secondary to alcoholic cirrhosis. Request for therapeutic paracentesis. EXAM: ULTRASOUND GUIDED PARACENTESIS MEDICATIONS: 1% lidocaine 10 mL COMPLICATIONS: None immediate. PROCEDURE: Informed written consent was obtained from the patient after a discussion of the risks, benefits and alternatives to treatment. A timeout was performed prior to the initiation of the procedure. Initial ultrasound scanning demonstrates a moderate amount of ascites within the right lower abdominal quadrant. The right lower abdomen was prepped and draped in the usual sterile fashion. 1%  lidocaine was used for local anesthesia. Following this, a 6 Fr Safe-T-Centesis catheter was introduced. An ultrasound image was saved for documentation purposes. The paracentesis was performed. The catheter was removed and a dressing was applied. The  patient tolerated the procedure well without immediate post procedural complication. FINDINGS: A total of approximately 3.725 of clear yellow fluid was removed. IMPRESSION: Successful ultrasound-guided paracentesis yielding 3.725 liters of peritoneal fluid. Read by: Corrin Parker, PA-C Electronically Signed   By: Corlis Leak M.D.   On: 05/31/2020 14:45   US Paracentesis  Result Date: 05/27/2020 INDICATION: History of alcoholic cirrhosis, now with symptomatic intra-abdominal ascites. Please perform ultrasound-guided paracentesis for diagnostic therapeutic purposes. EXAM: ULTRASOUND-GUIDED PARACENTESIS COMPARISON:  CT abdomen and pelvis-05/27/2020 MEDICATIONS: None. COMPLICATIONS: None immediate. TECHNIQUE: Informed written consent was obtained from the patient after a discussion of the risks, benefits and alternatives to treatment. A timeout was performed prior to the initiation of the procedure. Initial ultrasound scanning demonstrates a large amount of ascites within the right lower abdomen which was subsequently prepped and draped in the usual sterile fashion. 1% lidocaine with epinephrine was used for local anesthesia. An ultrasound image was saved for documentation purposed. An 8 Fr Safe-T-Centesis catheter was introduced. The paracentesis was performed. The catheter was removed and a dressing was applied. The patient tolerated the procedure well without immediate post procedural complication. FINDINGS: A total of approximately 4 liters of dark yellow serous fluid was removed. Samples were sent to the laboratory as requested by the clinical team. IMPRESSION: Successful ultrasound-guided paracentesis yielding 4 liters of peritoneal fluid. Electronically Signed   By:  Simonne Come M.D.   On: 05/27/2020 10:12   DG Chest Port 1 View  Result Date: 05/27/2020 CLINICAL DATA:  Shortness of breath EXAM: PORTABLE CHEST 1 VIEW COMPARISON:  09/10/2019 FINDINGS: The heart size and mediastinal contours are within normal limits. Both lungs are clear. The visualized skeletal structures are unremarkable. IMPRESSION: No active disease. Electronically Signed   By: Alcide Clever M.D.   On: 05/27/2020 00:13   US ABDOMINAL PELVIC ART/VENT FLOW DOPPLER LIMITED  Result Date: 05/27/2020 CLINICAL DATA:  Cirrhosis.  Ascites.  Cholelithiasis. EXAM: ULTRASOUND ABDOMEN LIMITED RIGHT UPPER QUADRANT COMPARISON:  CT 04/27/2020. FINDINGS: Gallbladder: 4.5 mm gallstone noted. Prominent amount of sludge noted. Prominent gallbladder wall thickening to 7.5 mm. This could be from hypoproteinemia and or cholecystitis. Common bile duct: Diameter: 3.6 mm Liver: Heterogeneous nodular parenchymal pattern consistent with cirrhosis. No mass noted. Reversal of flow is noted in the portal vein. A portion of the portal vein appears to be thrombosed. The umbilical vein appears to be recanalized. Other: Ascites. IMPRESSION: 1. 4.5 mm gallstone. Prominent amount of sludge. Gallbladder wall thickening to 7.5 mm. This could be from hypoproteinemia and or cholecystitis. No biliary distention. 2. Heterogeneous nodular hepatic parenchymal pattern consistent with cirrhosis. No hepatic mass noted. 3. Reversal of flow is noted in the portal vein. A portion of the portal vein appears to be thrombosed. The umbilical vein appears to be recanalized. 4.  Ascites. Electronically Signed   By: Maisie Fus  Register   On: 05/27/2020 06:15   US ABDOMEN LIMITED RUQ  Result Date: 05/27/2020 CLINICAL DATA:  Cirrhosis.  Ascites.  Cholelithiasis. EXAM: ULTRASOUND ABDOMEN LIMITED RIGHT UPPER QUADRANT COMPARISON:  CT 04/27/2020. FINDINGS: Gallbladder: 4.5 mm gallstone noted. Prominent amount of sludge noted. Prominent gallbladder wall thickening to  7.5 mm. This could be from hypoproteinemia and or cholecystitis. Common bile duct: Diameter: 3.6 mm Liver: Heterogeneous nodular parenchymal pattern consistent with cirrhosis. No mass noted. Reversal of flow is noted in the portal vein. A portion of the portal vein appears to be thrombosed. The umbilical vein appears to be recanalized. Other: Ascites. IMPRESSION: 1. 4.5 mm  gallstone. Prominent amount of sludge. Gallbladder wall thickening to 7.5 mm. This could be from hypoproteinemia and or cholecystitis. No biliary distention. 2. Heterogeneous nodular hepatic parenchymal pattern consistent with cirrhosis. No hepatic mass noted. 3. Reversal of flow is noted in the portal vein. A portion of the portal vein appears to be thrombosed. The umbilical vein appears to be recanalized. 4.  Ascites. Electronically Signed   By: Maisie Fus  Register   On: 05/27/2020 06:15    DISCHARGE EXAMINATION: Vitals:   06/01/20 0402 06/01/20 0500 06/01/20 0805 06/01/20 1152  BP: (!) 92/54  101/70 110/62  Pulse: 94  69 88  Resp: 18  18 18   Temp: 98.2 F (36.8 C)  98.6 F (37 C) 98 F (36.7 C)  TempSrc: Oral  Oral Oral  SpO2: 98%  98% 98%  Weight:  76.9 kg    Height:       General appearance: Awake alert.  In no distress Resp: Clear to auscultation bilaterally.  Normal effort Cardio: S1-S2 is normal regular.  No S3-S4.  No rubs murmurs or bruit GI: Abdomen is less distended today after she underwent paracentesis yesterday.  Nontender.   Extremities: No edema.  Full range of motion of lower extremities. Neurologic: Alert and oriented x3.  No focal neurological deficits.    DISPOSITION: Home  Discharge Instructions    Call MD for:  extreme fatigue   Complete by: As directed    Call MD for:  persistant dizziness or light-headedness   Complete by: As directed    Call MD for:  persistant nausea and vomiting   Complete by: As directed    Call MD for:  severe uncontrolled pain   Complete by: As directed    Call MD  for:  temperature >100.4   Complete by: As directed    Discharge instructions   Complete by: As directed    Please be sure to follow-up with your hepatologist at [facility] in the next 1 to 2 weeks.  Take your medications as prescribed.  Seek attention immediately if your symptoms recur.  Seek attention if you develop dizziness or lightheadedness as it may suggest dangerously low blood pressure.  You were cared for by a hospitalist during your hospital stay. If you have any questions about your discharge medications or the care you received while you were in the hospital after you are discharged, you can call the unit and asked to speak with the hospitalist on call if the hospitalist that took care of you is not available. Once you are discharged, your primary care physician will handle any further medical issues. Please note that NO REFILLS for any discharge medications will be authorized once you are discharged, as it is imperative that you return to your primary care physician (or establish a relationship with a primary care physician if you do not have one) for your aftercare needs so that they can reassess your need for medications and monitor your lab values. If you do not have a primary care physician, you can call [phone] for a physician referral.   Increase activity slowly   Complete by: As directed        Allergies as of 06/01/2020   No Known Allergies     Medication List    STOP taking these medications   MILK THISTLE PO   Potassium 99 MG Tabs     TAKE these medications   ciprofloxacin 500 MG tablet Commonly known as: Cipro Take 1 tablet (500 mg total) by  mouth 2 (two) times daily for 5 days.   ferrous sulfate 325 (65 FE) MG tablet Take 325 mg by mouth daily with breakfast.   furosemide 20 MG tablet Commonly known as: LASIX Take 1 tablet (20 mg total) by mouth daily. What changed: when to take this   lactulose 10 GM/15ML solution Commonly known as: CHRONULAC Take 15 mLs  (10 g total) by mouth 2 (two) times daily.   magnesium oxide 400 MG tablet Commonly known as: MAG-OX Take 400 mg by mouth daily.   midodrine 10 MG tablet Commonly known as: PROAMATINE Take 1 tablet (10 mg total) by mouth 3 (three) times daily with meals.   multivitamin with minerals Tabs tablet Take 1 tablet by mouth daily.   oxyCODONE 5 MG immediate release tablet Commonly known as: Oxy IR/ROXICODONE Take 1 tablet (5 mg total) by mouth every 6 (six) hours as needed for moderate pain.   potassium chloride 10 MEQ CR capsule Commonly known as: MICRO-K Take 2 capsules (20 mEq total) by mouth daily.   thiamine 100 MG tablet Commonly known as: Vitamin B-1 Take 100 mg by mouth daily.   vitamin B-12 1000 MCG tablet Commonly known as: CYANOCOBALAMIN Take 1,000 mcg by mouth daily.         Follow-up Information    [place], Suella GroveMaria Cristina, MD. Schedule an appointment as soon as possible for a visit in 1 week(s).   Specialty: Gastroenterology Contact information: [address] [place] [id number] [phone]               TOTAL DISCHARGE TIME: 35 minutes  Ikeya Brockel Rito EhrlichKrishnan  [facility] Pager on [email or web address]  06/01/2020, 12:52 PM

## 2020-10-01 ENCOUNTER — Other Ambulatory Visit: Payer: Self-pay | Admitting: Gastroenterology

## 2020-10-01 DIAGNOSIS — K7469 Other cirrhosis of liver: Secondary | ICD-10-CM

## 2020-10-02 ENCOUNTER — Other Ambulatory Visit: Payer: Self-pay

## 2020-10-02 ENCOUNTER — Ambulatory Visit
Admission: RE | Admit: 2020-10-02 | Discharge: 2020-10-02 | Disposition: A | Discharge status: Home / Self Care | Payer: Medicaid Other | Source: Ambulatory Visit | Attending: Gastroenterology | Admitting: Gastroenterology

## 2020-10-02 DIAGNOSIS — K7469 Other cirrhosis of liver: Secondary | ICD-10-CM | POA: Diagnosis present

## 2020-10-02 LAB — BODY FLUID CELL COUNT WITH DIFFERENTIAL
Eos, Fluid: 0 %
Lymphs, Fluid: 6 %
Monocyte-Macrophage-Serous Fluid: 16 %
Neutrophil Count, Fluid: 78 %
Total Nucleated Cell Count, Fluid: 3218 cu mm

## 2020-10-02 LAB — PROTEIN, PLEURAL OR PERITONEAL FLUID: Total protein, fluid: <3 g/dL

## 2020-10-02 LAB — ALBUMIN, PLEURAL OR PERITONEAL FLUID: Albumin, Fluid: <1 g/dL

## 2020-10-02 LAB — PATHOLOGIST SMEAR REVIEW

## 2020-10-02 MED ORDER — ALBUMIN HUMAN 25 % IV SOLN
25.0000 g | Freq: Once | INTRAVENOUS | Status: AC
  Administered 2020-10-02: 25 g via INTRAVENOUS

## 2020-10-02 MED ORDER — ALBUMIN HUMAN 25 % IV SOLN
INTRAVENOUS | Status: AC
  Administered 2020-10-02: 25 g via INTRAVENOUS
  Filled 2020-10-02: qty 100

## 2020-10-02 MED ORDER — ALBUMIN HUMAN 25 % IV SOLN: 25.0000 g | Freq: Once | INTRAVENOUS | Status: AC

## 2020-10-02 NOTE — Discharge Instructions (Signed)
Paracentesis, Care After °This sheet gives you information about how to care for yourself after your procedure. Your health care provider may also give you more specific instructions. If you have problems or questions, contact your health care provider. °What can I expect after the procedure? °After the procedure, it is common to have a small amount of clear fluid coming from the puncture site. °Follow these instructions at home: °Puncture site care ° °· Follow instructions from your health care provider about how to take care of your puncture site. Make sure you: °? Wash your hands with soap and water before and after you change your bandage (dressing). If soap and water are not available, use hand sanitizer. °? Change your dressing as told by your health care provider. °· Check your puncture area every day for signs of infection. Check for: °? Redness, swelling, or pain. °? More fluid or blood. °? Warmth. °? Pus or a bad smell. °General instructions °· Return to your normal activities as told by your health care provider. Ask your health care provider what activities are safe for you. °· Take over-the-counter and prescription medicines only as told by your health care provider. °· Do not take baths, swim, or use a hot tub until your health care provider approves. Ask your health care provider if you may take showers. You may only be allowed to take sponge baths. °· Keep all follow-up visits as told by your health care provider. This is important. °Contact a health care provider if: °· You have redness, swelling, or pain at your puncture site. °· You have more fluid or blood coming from your puncture site. °· Your puncture site feels warm to the touch. °· You have pus or a bad smell coming from your puncture site. °· You have a fever. °Get help right away if: °· You have chest pain or shortness of breath. °· You develop increasing pain, discomfort, or swelling in your abdomen. °· You feel dizzy or light-headed or  you faint. °Summary °· After the procedure, it is common to have a small amount of clear fluid coming from the puncture site. °· Follow instructions from your health care provider about how to take care of your puncture site. °· Check your puncture area every day signs of infection. °· Keep all follow-up visits as told by your health care provider. °This information is not intended to replace advice given to you by your health care provider. Make sure you discuss any questions you have with your health care provider. °Document Revised: 05/22/2019 Document Reviewed: 08/29/2018 °Elsevier Patient Education © 2020 [employer] ° °

## 2020-10-02 NOTE — Procedures (Signed)
US paracentesis without difficulty  Complications:  None  Blood Loss: none  See dictation in canopy pacs  

## 2020-10-03 LAB — PROTEIN, BODY FLUID (OTHER): Total Protein, Body Fluid Other: 0.7 g/dL

## 2020-10-05 LAB — BODY FLUID CULTURE: Culture: NO GROWTH

## 2020-10-06 ENCOUNTER — Inpatient Hospital Stay
Admission: EM | Admit: 2020-10-06 | Discharge: 2020-10-09 | DRG: 372 | Disposition: A | Discharge status: Home / Self Care | Payer: Medicaid Other | Attending: Internal Medicine | Admitting: Internal Medicine

## 2020-10-06 ENCOUNTER — Other Ambulatory Visit: Payer: Self-pay

## 2020-10-06 ENCOUNTER — Encounter: Payer: Self-pay | Admitting: Internal Medicine

## 2020-10-06 DIAGNOSIS — K766 Portal hypertension: Secondary | ICD-10-CM | POA: Diagnosis present

## 2020-10-06 DIAGNOSIS — D689 Coagulation defect, unspecified: Secondary | ICD-10-CM | POA: Diagnosis present

## 2020-10-06 DIAGNOSIS — Z87891 Personal history of nicotine dependence: Secondary | ICD-10-CM | POA: Diagnosis not present

## 2020-10-06 DIAGNOSIS — Z9114 Patient's other noncompliance with medication regimen: Secondary | ICD-10-CM | POA: Diagnosis not present

## 2020-10-06 DIAGNOSIS — R1084 Generalized abdominal pain: Secondary | ICD-10-CM | POA: Diagnosis not present

## 2020-10-06 DIAGNOSIS — D638 Anemia in other chronic diseases classified elsewhere: Secondary | ICD-10-CM | POA: Diagnosis present

## 2020-10-06 DIAGNOSIS — Z20822 Contact with and (suspected) exposure to covid-19: Secondary | ICD-10-CM | POA: Diagnosis present

## 2020-10-06 DIAGNOSIS — E876 Hypokalemia: Secondary | ICD-10-CM | POA: Diagnosis present

## 2020-10-06 DIAGNOSIS — K652 Spontaneous bacterial peritonitis: Secondary | ICD-10-CM | POA: Diagnosis not present

## 2020-10-06 DIAGNOSIS — D696 Thrombocytopenia, unspecified: Secondary | ICD-10-CM | POA: Diagnosis present

## 2020-10-06 DIAGNOSIS — T502X5A Adverse effect of carbonic-anhydrase inhibitors, benzothiadiazides and other diuretics, initial encounter: Secondary | ICD-10-CM | POA: Diagnosis present

## 2020-10-06 DIAGNOSIS — Z79899 Other long term (current) drug therapy: Secondary | ICD-10-CM

## 2020-10-06 DIAGNOSIS — I959 Hypotension, unspecified: Secondary | ICD-10-CM | POA: Diagnosis present

## 2020-10-06 DIAGNOSIS — F32A Depression, unspecified: Secondary | ICD-10-CM | POA: Diagnosis present

## 2020-10-06 DIAGNOSIS — E8770 Fluid overload, unspecified: Secondary | ICD-10-CM | POA: Diagnosis present

## 2020-10-06 DIAGNOSIS — K7290 Hepatic failure, unspecified without coma: Secondary | ICD-10-CM | POA: Diagnosis present

## 2020-10-06 DIAGNOSIS — K7031 Alcoholic cirrhosis of liver with ascites: Secondary | ICD-10-CM | POA: Diagnosis not present

## 2020-10-06 DIAGNOSIS — R188 Other ascites: Secondary | ICD-10-CM

## 2020-10-06 DIAGNOSIS — K3189 Other diseases of stomach and duodenum: Secondary | ICD-10-CM | POA: Diagnosis present

## 2020-10-06 DIAGNOSIS — Z818 Family history of other mental and behavioral disorders: Secondary | ICD-10-CM

## 2020-10-06 DIAGNOSIS — Z9119 Patient's noncompliance with other medical treatment and regimen: Secondary | ICD-10-CM | POA: Diagnosis not present

## 2020-10-06 LAB — CBC
HCT: 25.8 % — ABNORMAL LOW (ref 36.0–46.0)
Hemoglobin: 8.8 g/dL — ABNORMAL LOW (ref 12.0–15.0)
MCH: 35.3 pg — ABNORMAL HIGH (ref 26.0–34.0)
MCHC: 34.1 g/dL (ref 30.0–36.0)
MCV: 103.6 fL — ABNORMAL HIGH (ref 80.0–100.0)
Platelets: 78 10*3/uL — ABNORMAL LOW (ref 150–400)
RBC: 2.49 MIL/uL — ABNORMAL LOW (ref 3.87–5.11)
RDW: 13.5 % (ref 11.5–15.5)
WBC: 6.2 10*3/uL (ref 4.0–10.5)
nRBC: 0 % (ref 0.0–0.2)

## 2020-10-06 LAB — BASIC METABOLIC PANEL
Anion gap: 10 (ref 5–15)
BUN: 9 mg/dL (ref 6–20)
CO2: 20 mmol/L — ABNORMAL LOW (ref 22–32)
Calcium: 8.6 mg/dL — ABNORMAL LOW (ref 8.9–10.3)
Chloride: 106 mmol/L (ref 98–111)
Creatinine, Ser: 0.31 mg/dL — ABNORMAL LOW (ref 0.44–1.00)
GFR, Estimated: >60 mL/min (ref >60)
Glucose, Bld: 94 mg/dL (ref 70–99)
Potassium: 3.3 mmol/L — ABNORMAL LOW (ref 3.5–5.1)
Sodium: 136 mmol/L (ref 135–145)

## 2020-10-06 LAB — HEPATIC FUNCTION PANEL
ALT: 25 U/L (ref 0–44)
AST: 58 U/L — ABNORMAL HIGH (ref 15–41)
Albumin: 3.3 g/dL — ABNORMAL LOW (ref 3.5–5.0)
Alkaline Phosphatase: 103 U/L (ref 38–126)
Bilirubin, Direct: 3 mg/dL — ABNORMAL HIGH (ref 0.0–0.2)
Indirect Bilirubin: 7.6 mg/dL — ABNORMAL HIGH (ref 0.3–0.9)
Total Bilirubin: 10.6 mg/dL — ABNORMAL HIGH (ref 0.3–1.2)
Total Protein: 6.4 g/dL — ABNORMAL LOW (ref 6.5–8.1)

## 2020-10-06 LAB — RESPIRATORY PANEL BY RT PCR (FLU A&B, COVID)
Influenza A by PCR: NEGATIVE
Influenza B by PCR: NEGATIVE
SARS Coronavirus 2 by RT PCR: NEGATIVE

## 2020-10-06 LAB — LACTIC ACID, PLASMA
Lactic Acid, Venous: 1.4 mmol/L (ref 0.5–1.9)
Lactic Acid, Venous: 1.1 mmol/L (ref 0.5–1.9)

## 2020-10-06 LAB — PROTIME-INR
INR: 2 — ABNORMAL HIGH (ref 0.8–1.2)
Prothrombin Time: 21.7 seconds — ABNORMAL HIGH (ref 11.4–15.2)

## 2020-10-06 LAB — AMMONIA: Ammonia: 48 umol/L — ABNORMAL HIGH (ref 9–35)

## 2020-10-06 MED ORDER — THIAMINE HCL 100 MG PO TABS
50.0000 mg | ORAL_TABLET | Freq: Every day | ORAL | Status: DC
  Administered 2020-10-06 – 2020-10-09 (×4): 50 mg via ORAL
  Filled 2020-10-06 (×4): qty 1

## 2020-10-06 MED ORDER — SODIUM CHLORIDE 0.9% FLUSH
3.0000 mL | Freq: Two times a day (BID) | INTRAVENOUS | Status: DC
  Administered 2020-10-06 – 2020-10-09 (×7): 3 mL via INTRAVENOUS

## 2020-10-06 MED ORDER — MIDODRINE HCL 5 MG PO TABS
10.0000 mg | ORAL_TABLET | Freq: Three times a day (TID) | ORAL | Status: DC
  Administered 2020-10-09: 10 mg via ORAL
  Filled 2020-10-06 (×11): qty 2

## 2020-10-06 MED ORDER — OXYCODONE HCL 5 MG PO TABS
5.0000 mg | ORAL_TABLET | Freq: Four times a day (QID) | ORAL | Status: DC | PRN
  Administered 2020-10-06 – 2020-10-07 (×4): 5 mg via ORAL
  Filled 2020-10-06 (×4): qty 1

## 2020-10-06 MED ORDER — SODIUM CHLORIDE 0.9 % IV SOLN: 250.0000 mL | INTRAVENOUS | Status: DC | PRN

## 2020-10-06 MED ORDER — FUROSEMIDE 40 MG PO TABS
20.0000 mg | ORAL_TABLET | Freq: Every day | ORAL | Status: DC
  Administered 2020-10-06 – 2020-10-07 (×2): 20 mg via ORAL
  Filled 2020-10-06 (×2): qty 1

## 2020-10-06 MED ORDER — MORPHINE SULFATE (PF) 4 MG/ML IV SOLN
4.0000 mg | Freq: Once | INTRAVENOUS | Status: AC
  Administered 2020-10-06: 4 mg via INTRAVENOUS
  Filled 2020-10-06: qty 1

## 2020-10-06 MED ORDER — MAGNESIUM OXIDE 400 (241.3 MG) MG PO TABS
400.0000 mg | ORAL_TABLET | Freq: Every day | ORAL | Status: DC
  Administered 2020-10-07 – 2020-10-09 (×3): 400 mg via ORAL
  Filled 2020-10-06 (×3): qty 1

## 2020-10-06 MED ORDER — POTASSIUM CHLORIDE CRYS ER 20 MEQ PO TBCR
10.0000 meq | EXTENDED_RELEASE_TABLET | Freq: Every day | ORAL | Status: DC
  Administered 2020-10-07 – 2020-10-09 (×3): 10 meq via ORAL
  Filled 2020-10-06 (×3): qty 1

## 2020-10-06 MED ORDER — POTASSIUM CHLORIDE CRYS ER 20 MEQ PO TBCR
40.0000 meq | EXTENDED_RELEASE_TABLET | Freq: Once | ORAL | Status: AC
  Administered 2020-10-06: 40 meq via ORAL
  Filled 2020-10-06: qty 2

## 2020-10-06 MED ORDER — LACTULOSE 10 GM/15ML PO SOLN
10.0000 g | Freq: Two times a day (BID) | ORAL | Status: DC
  Administered 2020-10-06 – 2020-10-09 (×6): 10 g via ORAL
  Filled 2020-10-06 (×6): qty 30

## 2020-10-06 MED ORDER — SODIUM CHLORIDE 0.9 % IV SOLN
2.0000 g | INTRAVENOUS | Status: DC
  Administered 2020-10-07 – 2020-10-08 (×2): 2 g via INTRAVENOUS
  Filled 2020-10-06: qty 20
  Filled 2020-10-06: qty 2

## 2020-10-06 MED ORDER — FERROUS SULFATE 325 (65 FE) MG PO TABS
325.0000 mg | ORAL_TABLET | Freq: Every day | ORAL | Status: DC
  Administered 2020-10-07 – 2020-10-09 (×3): 325 mg via ORAL
  Filled 2020-10-06 (×3): qty 1

## 2020-10-06 MED ORDER — ADULT MULTIVITAMIN W/MINERALS CH
1.0000 | ORAL_TABLET | Freq: Every day | ORAL | Status: DC
  Administered 2020-10-07 – 2020-10-09 (×3): 1 via ORAL
  Filled 2020-10-06 (×3): qty 1

## 2020-10-06 MED ORDER — SODIUM CHLORIDE 0.9% FLUSH: 3.0000 mL | INTRAVENOUS | Status: DC | PRN

## 2020-10-06 MED ORDER — SODIUM CHLORIDE 0.9 % IV SOLN
2.0000 g | Freq: Once | INTRAVENOUS | Status: AC
  Administered 2020-10-06: 2 g via INTRAVENOUS
  Filled 2020-10-06: qty 20

## 2020-10-06 MED ORDER — ONDANSETRON HCL 4 MG/2ML IJ SOLN: 4.0000 mg | Freq: Four times a day (QID) | INTRAMUSCULAR | Status: DC | PRN

## 2020-10-06 MED ORDER — ONDANSETRON HCL 4 MG PO TABS: 4.0000 mg | ORAL_TABLET | Freq: Four times a day (QID) | ORAL | Status: DC | PRN

## 2020-10-06 MED ORDER — VITAMIN B-12 1000 MCG PO TABS
1000.0000 ug | ORAL_TABLET | Freq: Every day | ORAL | Status: DC
  Administered 2020-10-07 – 2020-10-09 (×3): 1000 ug via ORAL
  Filled 2020-10-06 (×3): qty 1

## 2020-10-06 NOTE — ED Provider Notes (Signed)
[facility] Emergency Department Provider Note ____________________________________________   First MD Initiated Contact with Patient 10/06/20 1220     (approximate)  I have reviewed the triage vital signs and the nursing notes.   HISTORY  Chief Complaint Wound Infection    HPI Lindsey Huang is a 39 y.o. female with PMH as noted below including history of liver cirrhosis and ascites as well as prior SBP who presents with concern for possible recurrent SBP after a paracentesis last week.  The patient states that she spoke to her doctor at [facility] who told her to come into the ED for admission and antibiotics.  The patient reports increased swelling of her abdomen last week, and it has recurred since she had the paracentesis 4 days ago.  She reports diffuse abdominal pain and tenderness.  She denies any associated vomiting, diarrhea, fever, or shortness of breath.  In addition, the patient reports persistent drainage of clear fluid from the paracentesis site, repeatedly soaking gauze over the last few days.   Past Medical History:  Diagnosis Date  . Blood transfusion without reported diagnosis 2010   misscarriage  . Cirrhosis (HCC)   . Hx of incompetent cervix, currently pregnant   . Medical history non-contributory   . Portal hypertensive gastropathy (HCC)   . Thrombocytopenia [facility])     Patient Active Problem List   Diagnosis Date Noted  . Spontaneous bacterial peritonitis (HCC) 05/28/2020  . Alcoholic cirrhosis of liver with ascites (HCC) 05/27/2020  . SBP (spontaneous bacterial peritonitis) (HCC) 05/27/2020  . Hyponatremia 05/27/2020  . Hyperbilirubinemia 05/27/2020  . Normocytic anemia 05/27/2020  . Decompensated hepatic cirrhosis (HCC) 10/18/2019  . Hypokalemia 10/18/2019  . Coagulopathy (HCC) 10/18/2019  . Total bilirubin, elevated 10/18/2019  . Hematemesis 10/18/2019  . Liver failure (HCC) 09/11/2019  . Indication for care in labor or delivery  12/25/2013  . NVD (normal vaginal delivery) 12/25/2013    Past Surgical History:  Procedure Laterality Date  . CERVICAL CERCLAGE     x4  . ESOPHAGOGASTRODUODENOSCOPY (EGD) WITH PROPOFOL N/A 10/19/2019   Procedure: ESOPHAGOGASTRODUODENOSCOPY (EGD) WITH PROPOFOL;  Surgeon: Wyline Mood, MD;  Location: [facility] ENDOSCOPY;  Service: Gastroenterology;  Laterality: N/A;    Prior to Admission medications   Medication Sig Start Date End Date Taking? Authorizing Provider  ferrous sulfate 325 (65 FE) MG tablet Take 325 mg by mouth daily with breakfast.    [provider]  furosemide (LASIX) 20 MG tablet Take 1 tablet (20 mg total) by mouth daily. 06/01/20   Osvaldo Shipper, MD  lactulose (CHRONULAC) 10 GM/15ML solution Take 15 mLs (10 g total) by mouth 2 (two) times daily. 06/01/20   Osvaldo Shipper, MD  magnesium oxide (MAG-OX) 400 MG tablet Take 400 mg by mouth daily.    [provider]  midodrine (PROAMATINE) 10 MG tablet Take 1 tablet (10 mg total) by mouth 3 (three) times daily with meals. 06/01/20   Osvaldo Shipper, MD  Multiple Vitamin (MULTIVITAMIN WITH MINERALS) TABS tablet Take 1 tablet by mouth daily.    [provider]  oxyCODONE (OXY IR/ROXICODONE) 5 MG immediate release tablet Take 1 tablet (5 mg total) by mouth every 6 (six) hours as needed for moderate pain. 06/01/20   Osvaldo Shipper, MD  potassium chloride (MICRO-K) 10 MEQ CR capsule Take 2 capsules (20 mEq total) by mouth daily. 06/01/20   Osvaldo Shipper, MD  vitamin B-12 (CYANOCOBALAMIN) 1000 MCG tablet Take 1,000 mcg by mouth daily.    [provider]  Allergies Patient has no known allergies.  Family History  Problem Relation Age of Onset  . Depression Maternal Aunt   . Alcohol abuse Neg Hx   . Arthritis Neg Hx   . Asthma Neg Hx   . Birth defects Neg Hx   . Cancer Neg Hx   . COPD Neg Hx   . Drug abuse Neg Hx   . Early death Neg Hx   . Diabetes Neg Hx   . Hearing loss Neg Hx   . Heart  disease Neg Hx   . Hyperlipidemia Neg Hx   . Hypertension Neg Hx   . Kidney disease Neg Hx   . Learning disabilities Neg Hx   . Mental illness Neg Hx   . Mental retardation Neg Hx   . Miscarriages / Stillbirths Neg Hx   . Stroke Neg Hx   . Vision loss Neg Hx     Social History Social History   Tobacco Use  . Smoking status: Former Smoker    Types: Cigarettes    Quit date: 08/28/2019    Years since quitting: 1.1  . Smokeless tobacco: Never Used  Vaping Use  . Vaping Use: Never assessed  Substance Use Topics  . Alcohol use: Not Currently    Comment: stopped in 08/2019  . Drug use: No    Review of Systems  Constitutional: No fever. Eyes: No redness. ENT: No sore throat. Cardiovascular: Denies chest pain. Respiratory: Denies shortness of breath. Gastrointestinal: No vomiting or diarrhea.  Genitourinary: Negative for dysuria.  Musculoskeletal: Negative for back pain. Skin: Negative for rash. Neurological: Negative for headache.   ____________________________________________   PHYSICAL EXAM:  VITAL SIGNS: ED Triage Vitals  Enc Vitals Group     BP 10/06/20 1100 (!) 100/50     Pulse Rate 10/06/20 1100 81     Resp 10/06/20 1100 16     Temp 10/06/20 1100 98.2 F (36.8 C)     Temp Source 10/06/20 1100 Oral     SpO2 10/06/20 1100 100 %     Weight 10/06/20 1102 140 lb 8 oz (63.7 kg)     Height 10/06/20 1102 4\' 11"  (1.499 m)     Head Circumference --      Peak Flow --      Pain Score 10/06/20 1101 6     Pain Loc --      Pain Edu? --      Excl. in GC? --     Constitutional: Alert and oriented. Well appearing and in no acute distress. Eyes: Conjunctivae are normal.  Head: Atraumatic. Nose: No congestion/rhinnorhea. Mouth/Throat: Mucous membranes are moist.   Neck: Normal range of motion.  Cardiovascular: Normal rate, regular rhythm.  Good peripheral circulation. Respiratory: Normal respiratory effort.  No retractions.  Gastrointestinal: Soft with mild  diffuse tenderness.  Moderate distention and fluid wave.  No erythema, induration, or abnormal warmth.  No purulent drainage. Genitourinary: No flank tenderness. Musculoskeletal: Extremities warm and well perfused.  Neurologic:  Normal speech and language. No gross focal neurologic deficits are appreciated.  Skin:  Skin is warm and dry. No rash noted. Psychiatric: Mood and affect are normal. Speech and behavior are normal.  ____________________________________________   LABS (all labs ordered are listed, but only abnormal results are displayed)  Labs Reviewed  CBC - Abnormal; Notable for the following components:      Result Value   RBC 2.49 (*)    Hemoglobin 8.8 (*)    HCT 25.8 (*)  MCV 103.6 (*)    MCH 35.3 (*)    Platelets 78 (*)    All other components within normal limits  BASIC METABOLIC PANEL - Abnormal; Notable for the following components:   Potassium 3.3 (*)    CO2 20 (*)    Creatinine, Ser 0.31 (*)    Calcium 8.6 (*)    All other components within normal limits  AMMONIA - Abnormal; Notable for the following components:   Ammonia 48 (*)    All other components within normal limits  HEPATIC FUNCTION PANEL - Abnormal; Notable for the following components:   Total Protein 6.4 (*)    Albumin 3.3 (*)    AST 58 (*)    Total Bilirubin 10.6 (*)    Bilirubin, Direct 3.0 (*)    Indirect Bilirubin 7.6 (*)    All other components within normal limits  PROTIME-INR - Abnormal; Notable for the following components:   Prothrombin Time 21.7 (*)    INR 2.0 (*)    All other components within normal limits  CULTURE, BLOOD (ROUTINE X 2)  CULTURE, BLOOD (ROUTINE X 2)  RESPIRATORY PANEL BY RT PCR (FLU A&B, COVID)  LACTIC ACID, PLASMA  LACTIC ACID, PLASMA   ____________________________________________  EKG   ____________________________________________  RADIOLOGY    ____________________________________________   PROCEDURES  Procedure(s) performed:  Yes  .Marland KitchenLaceration Repair  Date/Time: 10/06/2020 2:27 PM Performed by: Dionne Bucy, MD Authorized by: Dionne Bucy, MD   Consent:    Consent obtained:  Verbal   Consent given by:  Patient   Alternatives discussed:  No treatment Anesthesia (see MAR for exact dosages):    Anesthesia method:  None Laceration details:    Location:  Trunk   Trunk location:  RLQ abd   Length (cm):  0.2 Repair type:    Repair type:  Simple Exploration:    Hemostasis achieved with:  Direct pressure   Contaminated: no   Skin repair:    Repair method:  Tissue adhesive Post-procedure details:    Dressing:  Sterile dressing   Patient tolerance of procedure:  Tolerated well, no immediate complications    Critical Care performed: No ____________________________________________   INITIAL IMPRESSION / ASSESSMENT AND PLAN / ED COURSE  Pertinent labs & imaging results that were available during my care of the patient were reviewed by me and considered in my medical decision making (see chart for details).  39 year old female with a history of cirrhosis and other PMH as noted above presents for worsening pain and distention, and recent paracentesis results concerning for SBP.  I reviewed the past medical records in Epic.  The patient was last admitted in July with an elevated PMN count on paracentesis and treated with antibiotics.  Cultures at that time were negative.  She had a paracentesis here on 11/11, and the cell count shows 3218 nucleated cells.  She follows at the transplant center at [facility], but per the notes in care everywhere, they instructed her to come to the nearest ED for treatment.  On exam, the patient is overall well-appearing.  Her vital signs are normal.  The physical exam is remarkable for moderate abdominal distention and diffuse tenderness.  There is a small amount of fluid draining from the paracentesis site in the right lower abdomen.  There is no evidence of  cellulitis.  Overall presentation is concerning for SBP.  Although the cultures from 11/11 are negative, this is similar to her prior presentation in July.  Clinically her symptoms are consistent  with SBP.  I will obtain additional labs and start her on empiric ceftriaxone.  ----------------------------------------- 2:26 PM on 10/06/2020 -----------------------------------------  I was able to stop the fluid drainage with Dermabond.  The patient continues to be clinically stable.  I discussed the case with the hospitalist Dr. Joylene IgoAgbata for admission.  ____________________________________________   FINAL CLINICAL IMPRESSION(S) / ED DIAGNOSES  Final diagnoses:  Spontaneous bacterial peritonitis (HCC)      NEW MEDICATIONS STARTED DURING THIS VISIT:  New Prescriptions   No medications on file     Note:  This document was prepared using Dragon voice recognition software and may include unintentional dictation errors.    Dionne BucySiadecki, Deborrah Mabin, MD 10/06/20 1428

## 2020-10-06 NOTE — ED Triage Notes (Signed)
Pt here with a leaking wound from a paracentesis done at this facility about a week ago. Pt is also here to receive antibiotics for a known infection. Pt NAD in triage.

## 2020-10-06 NOTE — H&P (Signed)
History and Physical    Lindsey Huang JJO:[id number] DOB: 26-Dec-1980 DOA: 10/06/2020  PCP: Evelene Croon, MD   Patient coming from: Home  I have personally briefly reviewed patient's old medical records in [facility]  Chief Complaint: Abdominal pain  HPI: Lindsey Huang is a 39 y.o. female with medical history significant for alcoholic liver cirrhosis who follows up at [facility] health and is status post paracentesis about 3 days prior to her hospitalization.  Patient's ascitic fluid had > 3000 PMN's and patient was contacted by her gastroenterologist and advised to come to the emergency room for treatment of spontaneous bacterial peritonitis.  She complains of diffuse abdominal pain which she rates an 8 x 10 in intensity at its worst but and is draining fluid from the site of her recent paracentesis.  She denies having any fever or chills, no nausea, no vomiting, no changes in her bowel habits, no chest pain, no dizziness, no lightheadedness, no urinary symptoms. Labs show sodium 136, potassium, chloride 106, bicarb 20, glucose 90, BUN 9, creatinine 0.31, calcium 8.6, anion gap 10, alkaline phosphatase 103,, albumin 3.3, AST 48, ALT 25, total protein 6.4, ammonia 48, direct bilirubin 3.0, indirect bilirubin 7.6, total bilirubin 10.6, white count 6.2, hemoglobin 8.8, hematocrit 25.8, MCV 100.6, RDW 13.5, platelet count 78, PT 21.7 INR 2.0 Peritoneal fluid cytology showed acute inflammation with scattered macrophages.  Neutrophil count 2510 yes   ED Course: Patient is a 39 year old female with a history of alcoholic liver cirrhosis and was referred to the emergency by her gastroenterologist for IV antibiotic therapy for spontaneous bacterial peritonitis.  Patient had a paracentesis and ascitic fluid cytology yields >2500 neutrophils.  Patient received a dose of Rocephin in the ER and will be admitted to the hospital for further evaluation.  Review of Systems: As per HPI otherwise 10 point  review of systems negative.    Past Medical History:  Diagnosis Date  . Blood transfusion without reported diagnosis 2010   misscarriage  . Cirrhosis (HCC)   . Hx of incompetent cervix, currently pregnant   . Medical history non-contributory   . Portal hypertensive gastropathy (HCC)   . Thrombocytopenia (HCC)     Past Surgical History:  Procedure Laterality Date  . CERVICAL CERCLAGE     x4  . ESOPHAGOGASTRODUODENOSCOPY (EGD) WITH PROPOFOL N/A 10/19/2019   Procedure: ESOPHAGOGASTRODUODENOSCOPY (EGD) WITH PROPOFOL;  Surgeon: Wyline Mood, MD;  Location: [facility] ENDOSCOPY;  Service: Gastroenterology;  Laterality: N/A;     reports that she quit smoking about 13 months ago. Her smoking use included cigarettes. She has never used smokeless tobacco. She reports previous alcohol use. She reports that she does not use drugs.  No Known Allergies  Family History  Problem Relation Age of Onset  . Depression Maternal Aunt   . Alcohol abuse Neg Hx   . Arthritis Neg Hx   . Asthma Neg Hx   . Birth defects Neg Hx   . Cancer Neg Hx   . COPD Neg Hx   . Drug abuse Neg Hx   . Early death Neg Hx   . Diabetes Neg Hx   . Hearing loss Neg Hx   . Heart disease Neg Hx   . Hyperlipidemia Neg Hx   . Hypertension Neg Hx   . Kidney disease Neg Hx   . Learning disabilities Neg Hx   . Mental illness Neg Hx   . Mental retardation Neg Hx   . Miscarriages / Stillbirths Neg Hx   .  Stroke Neg Hx   . Vision loss Neg Hx      Prior to Admission medications   Medication Sig Start Date End Date Taking? Authorizing Provider  ferrous sulfate 325 (65 FE) MG tablet Take 325 mg by mouth daily with breakfast.    [provider]  furosemide (LASIX) 20 MG tablet Take 1 tablet (20 mg total) by mouth daily. 06/01/20   Osvaldo Shipper, MD  lactulose (CHRONULAC) 10 GM/15ML solution Take 15 mLs (10 g total) by mouth 2 (two) times daily. 06/01/20   Osvaldo Shipper, MD  magnesium oxide (MAG-OX) 400 MG tablet Take  400 mg by mouth daily.    [provider]  midodrine (PROAMATINE) 10 MG tablet Take 1 tablet (10 mg total) by mouth 3 (three) times daily with meals. 06/01/20   Osvaldo Shipper, MD  Multiple Vitamin (MULTIVITAMIN WITH MINERALS) TABS tablet Take 1 tablet by mouth daily.    [provider]  oxyCODONE (OXY IR/ROXICODONE) 5 MG immediate release tablet Take 1 tablet (5 mg total) by mouth every 6 (six) hours as needed for moderate pain. 06/01/20   Osvaldo Shipper, MD  potassium chloride (MICRO-K) 10 MEQ CR capsule Take 2 capsules (20 mEq total) by mouth daily. 06/01/20   Osvaldo Shipper, MD  vitamin B-12 (CYANOCOBALAMIN) 1000 MCG tablet Take 1,000 mcg by mouth daily.    [provider]    Physical Exam: Vitals:   10/06/20 1500 10/06/20 1515 10/06/20 1530 10/06/20 1555  BP: 103/71  101/66 100/63  Pulse: 91 83 73 72  Resp: 20 16 13 17   Temp:    98 F (36.7 C)  TempSrc:    Oral  SpO2: 100% 100% 100% 100%  Weight:      Height:         Vitals:   10/06/20 1500 10/06/20 1515 10/06/20 1530 10/06/20 1555  BP: 103/71  101/66 100/63  Pulse: 91 83 73 72  Resp: 20 16 13 17   Temp:    98 F (36.7 C)  TempSrc:    Oral  SpO2: 100% 100% 100% 100%  Weight:      Height:        Constitutional: NAD, alert and oriented x 3 Eyes: PERRL, lids and conjunctivae pallor, scleral icterus ENMT: Mucous membranes are moist.  Neck: normal, supple, no masses, no thyromegaly Respiratory: Bilateral air entry, no wheezing, no crackles. Normal respiratory effort. No accessory muscle use.  Cardiovascular: Regular rate and rhythm, no murmurs / rubs / gallops. 2+extremity edema. 2+ pedal pulses. No carotid bruits. Distended abdomen Abdomen: no tenderness, no masses palpated. No hepatosplenomegaly. Bowel sounds positive.  Musculoskeletal: no clubbing / cyanosis. No joint deformity upper and lower extremities.  Skin: no rashes, lesions, ulcers.  Neurologic: No gross focal neurologic  deficit. Psychiatric: Normal mood and affect.   Labs on Admission: I have personally reviewed following labs and imaging studies  CBC: Recent Labs  Lab 10/06/20 1108  WBC 6.2  HGB 8.8*  HCT 25.8*  MCV 103.6*  PLT 78*   Basic Metabolic Panel: Recent Labs  Lab 10/06/20 1108  NA 136  K 3.3*  CL 106  CO2 20*  GLUCOSE 94  BUN 9  CREATININE 0.31*  CALCIUM 8.6*   GFR: Estimated Creatinine Clearance: 75.9 mL/min (A) (by C-G formula based on SCr of 0.31 mg/dL (L)). Liver Function Tests: Recent Labs  Lab 10/06/20 1312  AST 58*  ALT 25  ALKPHOS 103  BILITOT 10.6*  PROT 6.4*  ALBUMIN 3.3*  No results for input(s): LIPASE, AMYLASE in the last 168 hours. Recent Labs  Lab 10/06/20 1312  AMMONIA 48*   Coagulation Profile: Recent Labs  Lab 10/06/20 1312  INR 2.0*   Cardiac Enzymes: No results for input(s): CKTOTAL, CKMB, CKMBINDEX, TROPONINI in the last 168 hours. BNP (last 3 results) No results for input(s): PROBNP in the last 8760 hours. HbA1C: No results for input(s): HGBA1C in the last 72 hours. CBG: No results for input(s): GLUCAP in the last 168 hours. Lipid Profile: No results for input(s): CHOL, HDL, LDLCALC, TRIG, CHOLHDL, LDLDIRECT in the last 72 hours. Thyroid Function Tests: No results for input(s): TSH, T4TOTAL, FREET4, T3FREE, THYROIDAB in the last 72 hours. Anemia Panel: No results for input(s): VITAMINB12, FOLATE, FERRITIN, TIBC, IRON, RETICCTPCT in the last 72 hours. Urine analysis:    Component Value Date/Time   COLORURINE AMBER (A) 05/27/2020 0757   APPEARANCEUR CLOUDY (A) 05/27/2020 0757   APPEARANCEUR Turbid 08/07/2012 0746   LABSPEC 1.016 05/27/2020 0757   LABSPEC 1.026 08/07/2012 0746   PHURINE 5.0 05/27/2020 0757   GLUCOSEU NEGATIVE 05/27/2020 0757   GLUCOSEU Negative 08/07/2012 0746   HGBUR SMALL (A) 05/27/2020 0757   BILIRUBINUR MODERATE (A) 05/27/2020 0757   BILIRUBINUR Negative 08/07/2012 0746   KETONESUR NEGATIVE  05/27/2020 0757   PROTEINUR NEGATIVE 05/27/2020 0757   NITRITE NEGATIVE 05/27/2020 0757   LEUKOCYTESUR NEGATIVE 05/27/2020 0757   LEUKOCYTESUR Negative 08/07/2012 0746    Radiological Exams on Admission: No results found.  EKG: Independently reviewed.    Assessment/Plan Principal Problem:   SBP (spontaneous bacterial peritonitis) (HCC) Active Problems:   Hypokalemia   Coagulopathy (HCC)   Alcoholic cirrhosis of liver with ascites (HCC)    Spontaneous bacterial peritonitis In a patient with known alcoholic liver cirrhosis Status post recent paracentesis with ascitic fluid yielding > 3000PMN's We will place patient on Rocephin 2 g IV daily We will consult GI   Hypokalemia Secondary to diuretic therapy Supplement potassium   Alcoholic liver cirrhosis Patient has complications of liver cirrhosis which include coagulopathy, portal hypertension and thrombocytopenia Follow-up at [facility] hepatology clinic as an outpatient  DVT prophylaxis: SCD Code Status: Full code Family Communication: Greater than 50% of time was spent discussing plan of care with patient at the bedside.  She verbalizes understanding and agrees with the plan. Disposition Plan: Back to previous home environment Consults called: Gastroenterology    Lucile Shutters MD [facility]     10/06/2020, 4:10 PM

## 2020-10-07 ENCOUNTER — Encounter: Payer: Self-pay | Admitting: Internal Medicine

## 2020-10-07 DIAGNOSIS — K7031 Alcoholic cirrhosis of liver with ascites: Secondary | ICD-10-CM

## 2020-10-07 DIAGNOSIS — E876 Hypokalemia: Secondary | ICD-10-CM | POA: Diagnosis not present

## 2020-10-07 DIAGNOSIS — D689 Coagulation defect, unspecified: Secondary | ICD-10-CM | POA: Diagnosis not present

## 2020-10-07 DIAGNOSIS — K652 Spontaneous bacterial peritonitis: Principal | ICD-10-CM

## 2020-10-07 LAB — BASIC METABOLIC PANEL WITH GFR
Anion gap: 8 (ref 5–15)
BUN: 8 mg/dL (ref 6–20)
CO2: 24 mmol/L (ref 22–32)
Calcium: 8.6 mg/dL — ABNORMAL LOW (ref 8.9–10.3)
Chloride: 104 mmol/L (ref 98–111)
Creatinine, Ser: 0.42 mg/dL — ABNORMAL LOW (ref 0.44–1.00)
GFR, Estimated: >60 mL/min
Glucose, Bld: 128 mg/dL — ABNORMAL HIGH (ref 70–99)
Potassium: 4.1 mmol/L (ref 3.5–5.1)
Sodium: 136 mmol/L (ref 135–145)

## 2020-10-07 LAB — CBC
HCT: 27.2 % — ABNORMAL LOW (ref 36.0–46.0)
Hemoglobin: 9.2 g/dL — ABNORMAL LOW (ref 12.0–15.0)
MCH: 35.4 pg — ABNORMAL HIGH (ref 26.0–34.0)
MCHC: 33.8 g/dL (ref 30.0–36.0)
MCV: 104.6 fL — ABNORMAL HIGH (ref 80.0–100.0)
Platelets: 85 10*3/uL — ABNORMAL LOW (ref 150–400)
RBC: 2.6 MIL/uL — ABNORMAL LOW (ref 3.87–5.11)
RDW: 13.4 % (ref 11.5–15.5)
WBC: 7.3 10*3/uL (ref 4.0–10.5)
nRBC: 0 % (ref 0.0–0.2)

## 2020-10-07 MED ORDER — SPIRONOLACTONE 25 MG PO TABS
100.0000 mg | ORAL_TABLET | Freq: Every day | ORAL | Status: DC
  Administered 2020-10-07 – 2020-10-08 (×2): 100 mg via ORAL
  Filled 2020-10-07 (×2): qty 4

## 2020-10-07 MED ORDER — FUROSEMIDE 40 MG PO TABS
40.0000 mg | ORAL_TABLET | Freq: Every day | ORAL | Status: DC
  Administered 2020-10-08: 40 mg via ORAL
  Filled 2020-10-07: qty 1

## 2020-10-07 NOTE — Plan of Care (Signed)
  Problem: Education: Goal: Knowledge of General Education information will improve Description: Including pain rating scale, medication(s)/side effects and non-pharmacologic comfort measures 10/07/2020 0815 by Ansel Bong, RN Outcome: Progressing 10/07/2020 0815 by Ansel Bong, RN Outcome: Progressing   Problem: Health Behavior/Discharge Planning: Goal: Ability to manage health-related needs will improve 10/07/2020 0815 by Ansel Bong, RN Outcome: Progressing 10/07/2020 0815 by Ansel Bong, RN Outcome: Progressing   Problem: Clinical Measurements: Goal: Ability to maintain clinical measurements within normal limits will improve 10/07/2020 0815 by Ansel Bong, RN Outcome: Progressing 10/07/2020 0815 by Ansel Bong, RN Outcome: Progressing Goal: Will remain free from infection 10/07/2020 0815 by Ansel Bong, RN Outcome: Progressing 10/07/2020 0815 by Ansel Bong, RN Outcome: Progressing Goal: Diagnostic test results will improve 10/07/2020 0815 by Ansel Bong, RN Outcome: Progressing 10/07/2020 0815 by Ansel Bong, RN Outcome: Progressing Goal: Respiratory complications will improve 10/07/2020 0815 by Ansel Bong, RN Outcome: Progressing 10/07/2020 0815 by Ansel Bong, RN Outcome: Progressing Goal: Cardiovascular complication will be avoided 10/07/2020 0815 by Ansel Bong, RN Outcome: Progressing 10/07/2020 0815 by Ansel Bong, RN Outcome: Progressing   Problem: Activity: Goal: Risk for activity intolerance will decrease 10/07/2020 0815 by Ansel Bong, RN Outcome: Progressing 10/07/2020 0815 by Ansel Bong, RN Outcome: Progressing   Problem: Nutrition: Goal: Adequate nutrition will be maintained 10/07/2020 0815 by Ansel Bong, RN Outcome: Progressing 10/07/2020 0815 by Ansel Bong, RN Outcome: Progressing   Problem: Coping: Goal: Level of anxiety will decrease 10/07/2020 0815 by Ansel Bong, RN Outcome:  Progressing 10/07/2020 0815 by Ansel Bong, RN Outcome: Progressing   Problem: Elimination: Goal: Will not experience complications related to bowel motility 10/07/2020 0815 by Ansel Bong, RN Outcome: Progressing 10/07/2020 0815 by Ansel Bong, RN Outcome: Progressing Goal: Will not experience complications related to urinary retention 10/07/2020 0815 by Ansel Bong, RN Outcome: Progressing 10/07/2020 0815 by Ansel Bong, RN Outcome: Progressing   Problem: Pain Managment: Goal: General experience of comfort will improve 10/07/2020 0815 by Ansel Bong, RN Outcome: Progressing 10/07/2020 0815 by Ansel Bong, RN Outcome: Progressing   Problem: Safety: Goal: Ability to remain free from injury will improve 10/07/2020 0815 by Ansel Bong, RN Outcome: Progressing 10/07/2020 0815 by Ansel Bong, RN Outcome: Progressing   Problem: Skin Integrity: Goal: Risk for impaired skin integrity will decrease 10/07/2020 0815 by Ansel Bong, RN Outcome: Progressing 10/07/2020 0815 by Ansel Bong, RN Outcome: Progressing

## 2020-10-07 NOTE — Consult Note (Signed)
Cephas Darby, MD [address]  [place]  [place],  [address]  Main: [phone]  Fax: [phone] Pager: [phone]   Consultation  Referring Provider:     No ref. provider found Primary Care Physician:  Lorelee Market, MD Primary Gastroenterologist: [facility]         Reason for Consultation:     Abdominal pain, history of SBP  Date of Admission:  10/06/2020 Date of Consultation:  10/07/2020         HPI:   Lindsey Huang is a 39 y.o. female decompensated alcoholic cirrhosis, known history of SBP diagnosed in 05/2020.  Patient presented to ER yesterday secondary to leakage of fluid from paracentesis site.  She was told by her GI that she has infection in her fluid and needed antibiotics.  Patient underwent therapeutic paracentesis on 11/11 as outpatient, 2.8 L of yellow fluid was removed.  Fluid analysis revealed WBC count 3218, 78% neutrophils.  Her initial episode of SBP revealed WBC count 17 K, 88% neutrophils in 05/2020 Patient is started on ceftriaxone and GI is consulted for further management.  Patient reports noncompliance with antibiotics to prevent recurrence of SBP within last few weeks.  She also reports that last weekend, she attended her sister's wedding, did not take diuretics during that weekend as she did not want to frequently use the bathroom.  She does acknowledge that she did not drink alcohol at all. Patient is sitting up in chair, comfortable, working on her laptop.  She denied fever, chills, abdominal pain, nausea or vomiting  NSAIDs: None  Antiplts/Anticoagulants/Anti thrombotics: None  GI Procedures: Upper endoscopy 10/19/2019 Severe portal hypertensive gastropathy, no evidence of varices   Past Medical History:  Diagnosis Date  . Blood transfusion without reported diagnosis 2010   misscarriage  . Cirrhosis ([place])   . Hx of incompetent cervix, currently pregnant   . Medical history non-contributory   . Portal hypertensive  gastropathy ([place])   . Thrombocytopenia ([place])     Past Surgical History:  Procedure Laterality Date  . CERVICAL CERCLAGE     x4  . ESOPHAGOGASTRODUODENOSCOPY (EGD) WITH PROPOFOL N/A 10/19/2019   Procedure: ESOPHAGOGASTRODUODENOSCOPY (EGD) WITH PROPOFOL;  Surgeon: Jonathon Bellows, MD;  Location: [facility];  Service: Gastroenterology;  Laterality: N/A;    Current Facility-Administered Medications:  .  0.9 %  sodium chloride infusion, 250 mL, Intravenous, PRN, Agbata, Tochukwu, MD .  cefTRIAXone (ROCEPHIN) 2 g in sodium chloride 0.9 % 100 mL IVPB, 2 g, Intravenous, Q24H, Agbata, Tochukwu, MD, Last Rate: 200 mL/hr at 10/07/20 0959, 2 g at 10/07/20 0959 .  ferrous sulfate tablet 325 mg, 325 mg, Oral, Q breakfast, Agbata, Tochukwu, MD, 325 mg at 10/07/20 0942 .  [START ON 10/08/2020] furosemide (LASIX) tablet 40 mg, 40 mg, Oral, Daily, Annajulia Lewing, Tally Due, MD .  lactulose (CHRONULAC) 10 GM/15ML solution 10 g, 10 g, Oral, BID, Agbata, Tochukwu, MD, 10 g at 10/07/20 0948 .  magnesium oxide (MAG-OX) tablet 400 mg, 400 mg, Oral, Daily, Agbata, Tochukwu, MD, 400 mg at 10/07/20 0949 .  midodrine (PROAMATINE) tablet 10 mg, 10 mg, Oral, TID WC, Agbata, Tochukwu, MD .  multivitamin with minerals tablet 1 tablet, 1 tablet, Oral, Daily, Agbata, Tochukwu, MD, 1 tablet at 10/07/20 0942 .  ondansetron (ZOFRAN) tablet 4 mg, 4 mg, Oral, Q6H PRN **OR** ondansetron (ZOFRAN) injection 4 mg, 4 mg, Intravenous, Q6H PRN, Agbata, Tochukwu, MD .  oxyCODONE (Oxy IR/ROXICODONE) immediate release tablet 5 mg, 5 mg, Oral,  Q6H PRN, Collier Bullock, MD, 5 mg at 10/07/20 0726 .  potassium chloride SA (KLOR-CON) CR tablet 10 mEq, 10 mEq, Oral, Daily, Agbata, Tochukwu, MD, 10 mEq at 10/07/20 0950 .  sodium chloride flush (NS) 0.9 % injection 3 mL, 3 mL, Intravenous, Q12H, Agbata, Tochukwu, MD, 3 mL at 10/07/20 0950 .  sodium chloride flush (NS) 0.9 % injection 3 mL, 3 mL, Intravenous, PRN, Agbata, Tochukwu, MD .  spironolactone  (ALDACTONE) tablet 100 mg, 100 mg, Oral, Daily, Cristella Stiver, Tally Due, MD .  thiamine tablet 50 mg, 50 mg, Oral, Daily, Agbata, Tochukwu, MD, 50 mg at 10/07/20 0943 .  vitamin B-12 (CYANOCOBALAMIN) tablet 1,000 mcg, 1,000 mcg, Oral, Daily, Agbata, Tochukwu, MD, 1,000 mcg at 10/07/20 8676    Family History  Problem Relation Age of Onset  . Depression Maternal Aunt   . Alcohol abuse Neg Hx   . Arthritis Neg Hx   . Asthma Neg Hx   . Birth defects Neg Hx   . Cancer Neg Hx   . COPD Neg Hx   . Drug abuse Neg Hx   . Early death Neg Hx   . Diabetes Neg Hx   . Hearing loss Neg Hx   . Heart disease Neg Hx   . Hyperlipidemia Neg Hx   . Hypertension Neg Hx   . Kidney disease Neg Hx   . Learning disabilities Neg Hx   . Mental illness Neg Hx   . Mental retardation Neg Hx   . Miscarriages / Stillbirths Neg Hx   . Stroke Neg Hx   . Vision loss Neg Hx      Social History   Tobacco Use  . Smoking status: Former Smoker    Types: Cigarettes    Quit date: 08/28/2019    Years since quitting: 1.1  . Smokeless tobacco: Never Used  Vaping Use  . Vaping Use: Never assessed  Substance Use Topics  . Alcohol use: Not Currently    Comment: stopped in 08/2019  . Drug use: No    Allergies as of 10/06/2020  . (No Known Allergies)    Review of Systems:    All systems reviewed and negative except where noted in HPI.   Physical Exam:  Vital signs in last 24 hours: Temp:  [97.9 F (36.6 C)-98.4 F (36.9 C)] 98.3 F (36.8 C) (11/16 1129) Pulse Rate:  [70-91] 70 (11/16 1129) Resp:  [13-20] 16 (11/16 1129) BP: (92-107)/(59-71) 100/66 (11/16 1129) SpO2:  [99 %-100 %] 100 % (11/16 1129) Last BM Date: 10/06/20 General:   Pleasant, cooperative in NAD Head:  Normocephalic and atraumatic. Eyes: Positive icterus.   Conjunctiva pink. PERRLA. Ears:  Normal auditory acuity. Neck:  Supple; no masses or thyroidomegaly Lungs: Respirations even and unlabored. Lungs clear to auscultation bilaterally.    No wheezes, crackles, or rhonchi.  Heart:  Regular rate and rhythm;  Without murmur, clicks, rubs or gallops Abdomen:  Soft, severely distended, not tense, nontender. Normal bowel sounds. No appreciable masses or hepatomegaly.  No rebound or guarding.  Rectal:  Not performed. Msk:  Symmetrical without gross deformities. Extremities:  1+ edema, no cyanosis or clubbing. Neurologic:  Alert and oriented x3;  grossly normal neurologically. Skin:  Intact without significant lesions or rashes. Cervical Nodes:  No significant cervical adenopathy. Psych:  Alert and cooperative. Normal affect.  LAB RESULTS: CBC Latest Ref Rng & Units 10/07/2020 10/06/2020 06/01/2020  WBC 4.0 - 10.5 K/uL 7.3 6.2 15.9(H)  Hemoglobin 12.0 - 15.0 g/dL 9.2(L)  8.8(L) 7.5(L)  Hematocrit 36 - 46 % 27.2(L) 25.8(L) 21.4(L)  Platelets 150 - 400 K/uL 85(L) 78(L) 88(L)    BMET BMP Latest Ref Rng & Units 10/07/2020 10/06/2020 06/01/2020  Glucose 70 - 99 mg/dL 128(H) 94 90  BUN 6 - 20 mg/dL 8 9 11   Creatinine 0.44 - 1.00 mg/dL 0.42(L) 0.31(L) UNABLE TO REPORT DUE TO ICTERUS  Sodium 135 - 145 mmol/L 136 136 131(L)  Potassium 3.5 - 5.1 mmol/L 4.1 3.3(L) 4.8  Chloride 98 - 111 mmol/L 104 106 99  CO2 22 - 32 mmol/L 24 20(L) 25  Calcium 8.9 - 10.3 mg/dL 8.6(L) 8.6(L) 8.4(L)    LFT Hepatic Function Latest Ref Rng & Units 10/06/2020 06/01/2020 05/31/2020  Total Protein 6.5 - 8.1 g/dL 6.4(L) 6.0(L) 6.1(L)  Albumin 3.5 - 5.0 g/dL 3.3(L) 3.1(L) 2.9(L)  AST 15 - 41 U/L 58(H) 58(H) 67(H)  ALT 0 - 44 U/L 25 18 22   Alk Phosphatase 38 - 126 U/L 103 131(H) 146(H)  Total Bilirubin 0.3 - 1.2 mg/dL 10.6(H) 28.0(HH) 33.7(HH)  Bilirubin, Direct 0.0 - 0.2 mg/dL 3.0(H) - -     STUDIES: No results found.    Impression / Plan:   Lindsey Huang is a 39 y.o. female with decompensated alcoholic cirrhosis with ascites, portal hypertensive gastropathy, history of SBP, initial episode 7/21 treated with ciprofloxacin, noncompliant with long-term  prophylactic antibiotic, presents with recurrence of SBP  Recurrence of SBP: Significant improvement in leukocytosis compared to the initial episode Agree with ceftriaxone, switch to ciprofloxacin 500 mg twice daily tomorrow for 10days then cipro 546m daily long term for secondary prevention.  Discussed about importance of adherence to antibiotic as long as she tolerates to prevent recurrence of SBP Patient will need cell count with differential on day 5 of antibiotics and this can be pursued as outpatient as well with repeat paracentesis  Volume overload/ascites: Secondary to medication nonadherence Recommend to restart home dose diuretics Lasix 60 mg daily and spironolactone 150 mg daily  Anticipate discharge home in next 1 to 2 days if patient continues to clinically improve  Thank you for involving me in the care of this patient.      LOS: 1 day   RSherri Sear MD  10/07/2020, 2:15 PM   Note: This dictation was prepared with Dragon dictation along with smaller phrase technology. Any transcriptional errors that result from this process are unintentional.

## 2020-10-07 NOTE — Progress Notes (Signed)
Progress Note    Lindsey Huang  ZOX:[id number] DOB: 09/23/1981  DOA: 10/06/2020 PCP: Evelene Croon, MD      Brief Narrative:    Medical records reviewed and are as summarized below:  Lindsey Huang is a 39 y.o. female with medical history significant for alcoholic liver cirrhosis who follows up at [facility] health and is status post paracentesis about 3 days prior to her hospitalization.  Patient's ascitic fluid had > 3000 PMN's and patient was contacted by her gastroenterologist and advised to come to the emergency room for treatment of spontaneous bacterial peritonitis.  She complained of severe abdominal pain.  She was admitted for spontaneous bacterial peritonitis.  She was treated with empiric IV antibiotics.      Assessment/Plan:   Principal Problem:   SBP (spontaneous bacterial peritonitis) (HCC) Active Problems:   Hypokalemia   Coagulopathy (HCC)   Alcoholic cirrhosis of liver with ascites (HCC)   Body mass index is 28.38 kg/m.    Spontaneous bacterial peritonitis: Ascitic fluid culture is pending.  Continue empiric IV Rocephin.  Follow-up with gastroenterologist.  Alcoholic liver cirrhosis complicated by ascites, thrombocytopenia and coagulopathy: Continue Lasix and lactulose  Hypokalemia: Improved.  Continue to monitor levels.  Anemia of chronic disease: H&H is stable   Diet Order            Diet 2 gram sodium Room service appropriate? Yes; Fluid consistency: Thin  Diet effective now                    Consultants:  Gastroenterologist  Procedures:  None    Medications:   . ferrous sulfate  325 mg Oral Q breakfast  . furosemide  20 mg Oral Daily  . lactulose  10 g Oral BID  . magnesium oxide  400 mg Oral Daily  . midodrine  10 mg Oral TID WC  . multivitamin with minerals  1 tablet Oral Daily  . potassium chloride  10 mEq Oral Daily  . sodium chloride flush  3 mL Intravenous Q12H  . thiamine  50 mg Oral Daily  . vitamin B-12   1,000 mcg Oral Daily   Continuous Infusions: . sodium chloride    . cefTRIAXone (ROCEPHIN)  IV 2 g (10/07/20 0959)     Anti-infectives (From admission, onward)   Start     Dose/Rate Route Frequency Ordered Stop   10/07/20 1000  cefTRIAXone (ROCEPHIN) 2 g in sodium chloride 0.9 % 100 mL IVPB        2 g 200 mL/hr over 30 Minutes Intravenous Every 24 hours 10/06/20 1604     10/06/20 1300  cefTRIAXone (ROCEPHIN) 2 g in sodium chloride 0.9 % 100 mL IVPB        2 g 200 mL/hr over 30 Minutes Intravenous  Once 10/06/20 1255 10/06/20 1426             Family Communication/Anticipated D/C date and plan/Code Status   DVT prophylaxis: SCDs Start: 10/06/20 1507     Code Status: Full Code  Family Communication: None Disposition Plan:    Status is: Inpatient  Remains inpatient appropriate because:IV treatments appropriate due to intensity of illness or inability to take PO   Dispo: The patient is from: Home              Anticipated d/c is to: Home              Anticipated d/c date is: 2 days  Patient currently is not medically stable to d/c.           Subjective:   C/o abdominal pain  Objective:    Vitals:   10/07/20 0100 10/07/20 0458 10/07/20 0754 10/07/20 1129  BP: 92/68 99/69 97/65  100/66  Pulse:  85 71 70  Resp: 16 18 17 16   Temp: 97.9 F (36.6 C) 98.3 F (36.8 C) 98.4 F (36.9 C) 98.3 F (36.8 C)  TempSrc: Oral Oral Oral Oral  SpO2: 99% 99% 100% 100%  Weight:      Height:       No data found.   Intake/Output Summary (Last 24 hours) at 10/07/2020 1404 Last data filed at 10/07/2020 0930 Gross per 24 hour  Intake 100.19 ml  Output --  Net 100.19 ml   Filed Weights   10/06/20 1102  Weight: 63.7 kg    Exam:  GEN: NAD SKIN: Jaundice EYES: EOMI, icteric ENT: MMM CV: RRR PULM: CTA B ABD: soft, distended, generalized tenderness but no rebound tenderness or guarding, +BS CNS: AAO x 3, non focal EXT: No edema or  tenderness   Data Reviewed:   I have personally reviewed following labs and imaging studies:  Labs: Labs show the following:   Basic Metabolic Panel: Recent Labs  Lab 10/06/20 1108 10/07/20 0645  NA 136 136  K 3.3* 4.1  CL 106 104  CO2 20* 24  GLUCOSE 94 128*  BUN 9 8  CREATININE 0.31* 0.42*  CALCIUM 8.6* 8.6*   GFR Estimated Creatinine Clearance: 75.9 mL/min (A) (by C-G formula based on SCr of 0.42 mg/dL (L)). Liver Function Tests: Recent Labs  Lab 10/06/20 1312  AST 58*  ALT 25  ALKPHOS 103  BILITOT 10.6*  PROT 6.4*  ALBUMIN 3.3*   No results for input(s): LIPASE, AMYLASE in the last 168 hours. Recent Labs  Lab 10/06/20 1312  AMMONIA 48*   Coagulation profile Recent Labs  Lab 10/06/20 1312  INR 2.0*    CBC: Recent Labs  Lab 10/06/20 1108 10/07/20 0645  WBC 6.2 7.3  HGB 8.8* 9.2*  HCT 25.8* 27.2*  MCV 103.6* 104.6*  PLT 78* 85*   Cardiac Enzymes: No results for input(s): CKTOTAL, CKMB, CKMBINDEX, TROPONINI in the last 168 hours. BNP (last 3 results) No results for input(s): PROBNP in the last 8760 hours. CBG: No results for input(s): GLUCAP in the last 168 hours. D-Dimer: No results for input(s): DDIMER in the last 72 hours. Hgb A1c: No results for input(s): HGBA1C in the last 72 hours. Lipid Profile: No results for input(s): CHOL, HDL, LDLCALC, TRIG, CHOLHDL, LDLDIRECT in the last 72 hours. Thyroid function studies: No results for input(s): TSH, T4TOTAL, T3FREE, THYROIDAB in the last 72 hours.  Invalid input(s): FREET3 Anemia work up: No results for input(s): VITAMINB12, FOLATE, FERRITIN, TIBC, IRON, RETICCTPCT in the last 72 hours. Sepsis Labs: Recent Labs  Lab 10/06/20 1108 10/06/20 1312 10/06/20 1456 10/07/20 0645  WBC 6.2  --   --  7.3  LATICACIDVEN  --  1.4 1.1  --     Microbiology Recent Results (from the past 240 hour(s))  Body fluid culture     Status: None   Collection Time: 10/02/20  9:00 AM   Specimen: PATH  Cytology Peritoneal fluid  Result Value Ref Range Status   Specimen Description   Final    PERITONEAL Performed at [facility], [address] [place], [address] [place]    Special Requests   Final  PERITONEAL Performed at [facility], [address] [place], [place] [address]    Gram Stain   Final    RARE WBC PRESENT, PREDOMINANTLY MONONUCLEAR NO ORGANISMS SEEN    Culture   Final    NO GROWTH Performed at [facility], [address] [place], [place] [address]    Report Status 10/05/2020 FINAL  Final  Culture, blood (routine x 2)     Status: None (Preliminary result)   Collection Time: 10/06/20  1:12 PM   Specimen: BLOOD  Result Value Ref Range Status   Specimen Description BLOOD LEFT AC  Final   Special Requests   Final    BOTTLES DRAWN AEROBIC AND ANAEROBIC Blood Culture adequate volume   Culture   Final    NO GROWTH < 24 HOURS Performed at [facility], [address] [place], [place] [address]    Report Status PENDING  Incomplete  Culture, blood (routine x 2)     Status: None (Preliminary result)   Collection Time: 10/06/20  1:12 PM   Specimen: BLOOD  Result Value Ref Range Status   Specimen Description BLOOD RIGHT FA  Final   Special Requests   Final    BOTTLES DRAWN AEROBIC AND ANAEROBIC Blood Culture results may not be optimal due to an excessive volume of blood received in culture bottles   Culture   Final    NO GROWTH < 24 HOURS Performed at [facility], [address] [place], [place] [address]    Report Status PENDING  Incomplete  Respiratory Panel by RT PCR (Flu A&B, Covid) - Nasopharyngeal Swab     Status: None   Collection Time: 10/06/20  2:58 PM   Specimen: Nasopharyngeal Swab  Result Value Ref Range Status   SARS Coronavirus 2 by RT PCR NEGATIVE NEGATIVE Final    Comment: (NOTE) SARS-CoV-2 target nucleic acids are NOT DETECTED.  The SARS-CoV-2 RNA is generally detectable in upper  respiratoy specimens during the acute phase of infection. The lowest concentration of SARS-CoV-2 viral copies this assay can detect is 131 copies/mL. A negative result does not preclude SARS-Cov-2 infection and should not be used as the sole basis for treatment or other patient management decisions. A negative result may occur with  improper specimen collection/handling, submission of specimen other than nasopharyngeal swab, presence of viral mutation(s) within the areas targeted by this assay, and inadequate number of viral copies (<131 copies/mL). A negative result must be combined with clinical observations, patient history, and epidemiological information. The expected result is Negative.  Fact Sheet for Patients:  https://www.moore.com/https://www.fda.gov/media/142436/download  Fact Sheet for Healthcare Providers:  https://www.young.biz/https://www.fda.gov/media/142435/download  This test is no t yet approved or cleared by the [place] FDA and  has been authorized for detection and/or diagnosis of SARS-CoV-2 by FDA under an Emergency Use Authorization (EUA). This EUA will remain  in effect (meaning this test can be used) for the duration of the COVID-19 declaration under Section 564(b)(1) of the Act, 21 U.S.C. section 360bbb-3(b)(1), unless the authorization is terminated or revoked sooner.     Influenza A by PCR NEGATIVE NEGATIVE Final   Influenza B by PCR NEGATIVE NEGATIVE Final    Comment: (NOTE) The Xpert Xpress SARS-CoV-2/FLU/RSV assay is intended as an aid in  the diagnosis of influenza from Nasopharyngeal swab specimens and  should not be used as a sole basis for treatment. Nasal washings and  aspirates are unacceptable for Xpert Xpress SARS-CoV-2/FLU/RSV  testing.  Fact Sheet for  Patients: https://www.moore.com/  Fact Sheet for Healthcare Providers: https://www.young.biz/  This test is not yet approved or cleared by the [place] FDA and  has been  authorized for detection and/or diagnosis of SARS-CoV-2 by  FDA under an Emergency Use Authorization (EUA). This EUA will remain  in effect (meaning this test can be used) for the duration of the  Covid-19 declaration under Section 564(b)(1) of the Act, 21  U.S.C. section 360bbb-3(b)(1), unless the authorization is  terminated or revoked. Performed at [facility], [address] [place], [place] [address]     Procedures and diagnostic studies:  No results found.             LOS: 1 day   Nicci Vaughan  [facility]   Pager on [email or web address]. If 7PM-7AM, please contact night-coverage at [email or web address]     10/07/2020, 2:04 PM

## 2020-10-08 DIAGNOSIS — K652 Spontaneous bacterial peritonitis: Secondary | ICD-10-CM | POA: Diagnosis not present

## 2020-10-08 DIAGNOSIS — E876 Hypokalemia: Secondary | ICD-10-CM | POA: Diagnosis not present

## 2020-10-08 DIAGNOSIS — D689 Coagulation defect, unspecified: Secondary | ICD-10-CM | POA: Diagnosis not present

## 2020-10-08 DIAGNOSIS — K7031 Alcoholic cirrhosis of liver with ascites: Secondary | ICD-10-CM | POA: Diagnosis not present

## 2020-10-08 LAB — BASIC METABOLIC PANEL
Anion gap: 8 (ref 5–15)
BUN: 7 mg/dL (ref 6–20)
CO2: 24 mmol/L (ref 22–32)
Calcium: 8.4 mg/dL — ABNORMAL LOW (ref 8.9–10.3)
Chloride: 103 mmol/L (ref 98–111)
Creatinine, Ser: 0.35 mg/dL — ABNORMAL LOW (ref 0.44–1.00)
GFR, Estimated: >60 mL/min (ref >60)
Glucose, Bld: 110 mg/dL — ABNORMAL HIGH (ref 70–99)
Potassium: 4 mmol/L (ref 3.5–5.1)
Sodium: 135 mmol/L (ref 135–145)

## 2020-10-08 LAB — CBC WITH DIFFERENTIAL/PLATELET
Abs Immature Granulocytes: 0.26 10*3/uL — ABNORMAL HIGH (ref 0.00–0.07)
Basophils Absolute: 0.1 10*3/uL (ref 0.0–0.1)
Basophils Relative: 1 %
Eosinophils Absolute: 0.2 10*3/uL (ref 0.0–0.5)
Eosinophils Relative: 2 %
HCT: 23.7 % — ABNORMAL LOW (ref 36.0–46.0)
Hemoglobin: 7.9 g/dL — ABNORMAL LOW (ref 12.0–15.0)
Immature Granulocytes: 4 %
Lymphocytes Relative: 16 %
Lymphs Abs: 1.1 10*3/uL (ref 0.7–4.0)
MCH: 35.4 pg — ABNORMAL HIGH (ref 26.0–34.0)
MCHC: 33.3 g/dL (ref 30.0–36.0)
MCV: 106.3 fL — ABNORMAL HIGH (ref 80.0–100.0)
Monocytes Absolute: 0.5 10*3/uL (ref 0.1–1.0)
Monocytes Relative: 7 %
Neutro Abs: 4.7 10*3/uL (ref 1.7–7.7)
Neutrophils Relative %: 70 %
Platelets: 72 10*3/uL — ABNORMAL LOW (ref 150–400)
RBC: 2.23 MIL/uL — ABNORMAL LOW (ref 3.87–5.11)
RDW: 13.5 % (ref 11.5–15.5)
WBC: 6.7 10*3/uL (ref 4.0–10.5)
nRBC: 0 % (ref 0.0–0.2)

## 2020-10-08 LAB — MAGNESIUM: Magnesium: 2.1 mg/dL (ref 1.7–2.4)

## 2020-10-08 LAB — PHOSPHORUS: Phosphorus: 4.5 mg/dL (ref 2.5–4.6)

## 2020-10-08 MED ORDER — SPIRONOLACTONE 25 MG PO TABS
150.0000 mg | ORAL_TABLET | Freq: Every day | ORAL | Status: DC
  Administered 2020-10-09: 150 mg via ORAL
  Filled 2020-10-08: qty 6

## 2020-10-08 MED ORDER — CITALOPRAM HYDROBROMIDE 20 MG PO TABS
10.0000 mg | ORAL_TABLET | Freq: Every day | ORAL | Status: DC
  Administered 2020-10-09: 10 mg via ORAL
  Filled 2020-10-08: qty 1

## 2020-10-08 MED ORDER — CIPROFLOXACIN IN D5W 400 MG/200ML IV SOLN
400.0000 mg | Freq: Two times a day (BID) | INTRAVENOUS | Status: DC
  Administered 2020-10-08 – 2020-10-09 (×2): 400 mg via INTRAVENOUS
  Filled 2020-10-08 (×4): qty 200

## 2020-10-08 MED ORDER — FUROSEMIDE 40 MG PO TABS
60.0000 mg | ORAL_TABLET | Freq: Every day | ORAL | Status: DC
  Administered 2020-10-09: 60 mg via ORAL
  Filled 2020-10-08: qty 1

## 2020-10-08 MED ORDER — OXYCODONE HCL 5 MG PO TABS
5.0000 mg | ORAL_TABLET | ORAL | Status: DC | PRN
  Administered 2020-10-08: 5 mg via ORAL
  Administered 2020-10-08 – 2020-10-09 (×4): 10 mg via ORAL
  Filled 2020-10-08 (×5): qty 2

## 2020-10-08 MED ORDER — CIPROFLOXACIN IN D5W 400 MG/200ML IV SOLN: 400.0000 mg | Freq: Two times a day (BID) | INTRAVENOUS | Status: DC

## 2020-10-08 NOTE — Progress Notes (Signed)
Lindsey Repress, MD [address]  [address]  [place], [place] [address]  Main: [phone]  Fax: [phone] Pager: [phone]   Subjective: Patient was tearful when I entered the room because she says she feels lonely and she has no one to talk to and she cannot get her pain medication.  She reports her back is hurting as well as abdomen.  She had 650 mL of urine output this morning.  She reports that she feels lighter and less swelling in her hands and feet.  However, she is requesting paracentesis today so that she can get back on her clothes.  She denies fever, chills and did not eat her breakfast as she was emotional.  She is asking if she could go home   Objective: Vital signs in last 24 hours: Vitals:   10/07/20 1943 10/07/20 2333 10/08/20 0457 10/08/20 0818  BP: (!) 92/59 99/61 100/68 100/67  Pulse: 74 77 86 78  Resp: 20 18  18   Temp: 98.2 F (36.8 C) 98.3 F (36.8 C) 98.2 F (36.8 C) 98.7 F (37.1 C)  TempSrc: Oral Oral    SpO2: 100% 100% 100% 100%  Weight:      Height:       Weight change:   Intake/Output Summary (Last 24 hours) at 10/08/2020 1147 Last data filed at 10/08/2020 0348 Gross per 24 hour  Intake 100 ml  Output --  Net 100 ml     Exam: Heart:: Regular rate and rhythm, S1S2 present or without murmur or extra heart sounds Lungs: normal and clear to auscultation Abdomen: Distended, dull to percussion, mild diffuse tenderness, not tense   Lab Results: CBC Latest Ref Rng & Units 10/08/2020 10/07/2020 10/06/2020  WBC 4.0 - 10.5 K/uL 6.7 7.3 6.2  Hemoglobin 12.0 - 15.0 g/dL 7.9(L) 9.2(L) 8.8(L)  Hematocrit 36 - 46 % 23.7(L) 27.2(L) 25.8(L)  Platelets 150 - 400 K/uL 72(L) 85(L) 78(L)   CMP Latest Ref Rng & Units 10/08/2020 10/07/2020 10/06/2020  Glucose 70 - 99 mg/dL 10/08/2020) 250(N) 94  BUN 6 - 20 mg/dL 7 8 9   Creatinine 0.44 - 1.00 mg/dL 397(Q) ) 7.34(L)  Sodium 135 - 145 mmol/L 135 136 136  Potassium 3.5 - 5.1 mmol/L 4.0 4.1  3.3(L)  Chloride 98 - 111 mmol/L 103 104 106  CO2 22 - 32 mmol/L 24 24 20(L)  Calcium 8.9 - 10.3 mg/dL 9.37(T) 0.24(O) 9.7(D)  Total Protein 6.5 - 8.1 g/dL - - 6.4(L)  Total Bilirubin 0.3 - 1.2 mg/dL - - 10.6(H)  Alkaline Phos 38 - 126 U/L - - 103  AST 15 - 41 U/L - - 58(H)  ALT 0 - 44 U/L - - 25    Micro Results: Recent Results (from the past 240 hour(s))  Body fluid culture     Status: None   Collection Time: 10/02/20  9:00 AM   Specimen: PATH Cytology Peritoneal fluid  Result Value Ref Range Status   Specimen Description   Final    PERITONEAL Performed at [facility], [address] [place], [address] [place]    Special Requests   Final    PERITONEAL Performed at [facility], [address] [place], [address] [place]    Gram Stain   Final    RARE WBC PRESENT, PREDOMINANTLY MONONUCLEAR NO ORGANISMS SEEN    Culture   Final    NO GROWTH Performed at [facility], [address] [place], [address] [place]  Report Status 10/05/2020 FINAL  Final  Culture, blood (routine x 2)     Status: None (Preliminary result)   Collection Time: 10/06/20  1:12 PM   Specimen: BLOOD  Result Value Ref Range Status   Specimen Description BLOOD LEFT AC  Final   Special Requests   Final    BOTTLES DRAWN AEROBIC AND ANAEROBIC Blood Culture adequate volume   Culture   Final    NO GROWTH 2 DAYS Performed at [facility], [address] [place], [place] [address]    Report Status PENDING  Incomplete  Culture, blood (routine x 2)     Status: None (Preliminary result)   Collection Time: 10/06/20  1:12 PM   Specimen: BLOOD  Result Value Ref Range Status   Specimen Description BLOOD RIGHT FA  Final   Special Requests   Final    BOTTLES DRAWN AEROBIC AND ANAEROBIC Blood Culture results may not be optimal due to an excessive volume of blood received in culture bottles   Culture   Final    NO GROWTH 2 DAYS Performed at [facility], [address] [place], [place] [address]    Report Status PENDING  Incomplete  Respiratory Panel by RT PCR (Flu A&B, Covid) - Nasopharyngeal Swab     Status: None   Collection Time: 10/06/20  2:58 PM   Specimen: Nasopharyngeal Swab  Result Value Ref Range Status   SARS Coronavirus 2 by RT PCR NEGATIVE NEGATIVE Final    Comment: (NOTE) SARS-CoV-2 target nucleic acids are NOT DETECTED.  The SARS-CoV-2 RNA is generally detectable in upper respiratoy specimens during the acute phase of infection. The lowest concentration of SARS-CoV-2 viral copies this assay can detect is 131 copies/mL. A negative result does not preclude SARS-Cov-2 infection and should not be used as the sole basis for treatment or other patient management decisions. A negative result may occur with  improper specimen collection/handling, submission of specimen other than nasopharyngeal swab, presence of viral mutation(s) within the areas targeted by this assay, and inadequate number of viral copies (<131 copies/mL). A negative result must be combined with clinical observations, patient history, and epidemiological information. The expected result is Negative.  Fact Sheet for Patients:  https://www.moore.com/https://www.fda.gov/media/142436/download  Fact Sheet for Healthcare Providers:  https://www.young.biz/https://www.fda.gov/media/142435/download  This test is no t yet approved or cleared by the [place] FDA and  has been authorized for detection and/or diagnosis of SARS-CoV-2 by FDA under an Emergency Use Authorization (EUA). This EUA will remain  in effect (meaning this test can be used) for the duration of the COVID-19 declaration under Section 564(b)(1) of the Act, 21 U.S.C. section 360bbb-3(b)(1), unless the authorization is terminated or revoked sooner.     Influenza A by PCR NEGATIVE NEGATIVE Final   Influenza B by PCR NEGATIVE NEGATIVE Final    Comment: (NOTE) The Xpert Xpress SARS-CoV-2/FLU/RSV assay is intended as an aid in  the diagnosis of  influenza from Nasopharyngeal swab specimens and  should not be used as a sole basis for treatment. Nasal washings and  aspirates are unacceptable for Xpert Xpress SARS-CoV-2/FLU/RSV  testing.  Fact Sheet for Patients: https://www.moore.com/https://www.fda.gov/media/142436/download  Fact Sheet for Healthcare Providers: https://www.young.biz/https://www.fda.gov/media/142435/download  This test is not yet approved or cleared by the [place] FDA and  has been authorized for detection and/or diagnosis of SARS-CoV-2 by  FDA under an Emergency Use Authorization (EUA). This EUA will remain  in effect (meaning this test can be used) for the duration of the  Covid-19 declaration under Section 564(b)(1) of the Act, 21  U.S.C. section 360bbb-3(b)(1), unless the authorization is  terminated or revoked. Performed at [facility], [address] [place], [place] [address]    Studies/Results: No results found. Medications:  I have reviewed the patient's current medications. Prior to Admission:  Medications Prior to Admission  Medication Sig Dispense Refill Last Dose  . furosemide (LASIX) 20 MG tablet Take 1 tablet (20 mg total) by mouth daily. 30 tablet 1 Past Week at Unknown time  . Multiple Vitamin (MULTIVITAMIN WITH MINERALS) TABS tablet Take 1 tablet by mouth daily.   Past Week at Unknown time  . oxyCODONE (OXY IR/ROXICODONE) 5 MG immediate release tablet Take 1 tablet (5 mg total) by mouth every 6 (six) hours as needed for moderate pain. 20 tablet 0 Past Week at Unknown time  . pantoprazole (PROTONIX) 20 MG tablet Take 20 mg by mouth daily.   Past Week at Unknown time  . thiamine (VITAMIN B-1) 50 MG tablet Take 50 mg by mouth daily.   Past Week at Unknown time  . lactulose (CHRONULAC) 10 GM/15ML solution Take 15 mLs (10 g total) by mouth 2 (two) times daily. (Patient taking differently: Take 10 g by mouth 2 (two) times daily as needed. ) 236 mL 0   . magnesium oxide (MAG-OX) 400 MG tablet Take 400 mg by mouth daily.  (Patient not taking: Reported on 10/06/2020)   Not Taking at Unknown time  . midodrine (PROAMATINE) 10 MG tablet Take 1 tablet (10 mg total) by mouth 3 (three) times daily with meals. (Patient not taking: Reported on 10/06/2020) 90 tablet 1 Not Taking at Unknown time  . potassium chloride (MICRO-K) 10 MEQ CR capsule Take 2 capsules (20 mEq total) by mouth daily. (Patient not taking: Reported on 10/06/2020) 60 capsule 1 Not Taking at Unknown time  . vitamin B-12 (CYANOCOBALAMIN) 1000 MCG tablet Take 1,000 mcg by mouth daily. (Patient not taking: Reported on 10/06/2020)   Not Taking at Unknown time   Scheduled: . ferrous sulfate  325 mg Oral Q breakfast  . [START ON 10/09/2020] furosemide  60 mg Oral Daily  . lactulose  10 g Oral BID  . magnesium oxide  400 mg Oral Daily  . midodrine  10 mg Oral TID WC  . multivitamin with minerals  1 tablet Oral Daily  . potassium chloride  10 mEq Oral Daily  . sodium chloride flush  3 mL Intravenous Q12H  . [START ON 10/09/2020] spironolactone  150 mg Oral Daily  . thiamine  50 mg Oral Daily  . vitamin B-12  1,000 mcg Oral Daily   Continuous: . sodium chloride    . [START ON 10/09/2020] ciprofloxacin     RDE:YCXKGY chloride, ondansetron **OR** ondansetron (ZOFRAN) IV, oxyCODONE, sodium chloride flush Anti-infectives (From admission, onward)   Start     Dose/Rate Route Frequency Ordered Stop   10/09/20 0000  ciprofloxacin (CIPRO) IVPB 400 mg        400 mg 200 mL/hr over 60 Minutes Intravenous Every 12 hours 10/08/20 1145     10/08/20 1230  ciprofloxacin (CIPRO) IVPB 400 mg  Status:  Discontinued        400 mg 200 mL/hr over 60 Minutes Intravenous Every 12 hours 10/08/20 1144 10/08/20 1145   10/07/20 1000  cefTRIAXone (ROCEPHIN) 2 g in sodium chloride 0.9 % 100 mL IVPB  Status:  Discontinued        2 g 200 mL/hr over 30 Minutes  Intravenous Every 24 hours 10/06/20 1604 10/08/20 1144   10/06/20 1300  cefTRIAXone (ROCEPHIN) 2 g in sodium chloride 0.9 %  100 mL IVPB        2 g 200 mL/hr over 30 Minutes Intravenous  Once 10/06/20 1255 10/06/20 1426     Scheduled Meds: . ferrous sulfate  325 mg Oral Q breakfast  . [START ON 10/09/2020] furosemide  60 mg Oral Daily  . lactulose  10 g Oral BID  . magnesium oxide  400 mg Oral Daily  . midodrine  10 mg Oral TID WC  . multivitamin with minerals  1 tablet Oral Daily  . potassium chloride  10 mEq Oral Daily  . sodium chloride flush  3 mL Intravenous Q12H  . [START ON 10/09/2020] spironolactone  150 mg Oral Daily  . thiamine  50 mg Oral Daily  . vitamin B-12  1,000 mcg Oral Daily   Continuous Infusions: . sodium chloride    . [START ON 10/09/2020] ciprofloxacin     PRN Meds:.sodium chloride, ondansetron **OR** ondansetron (ZOFRAN) IV, oxyCODONE, sodium chloride flush   Assessment: Principal Problem:   SBP (spontaneous bacterial peritonitis) (HCC) Active Problems:   Hypokalemia   Coagulopathy (HCC)   Alcoholic cirrhosis of liver with ascites (HCC)  Lindsey Huang is a 39 y.o. female with decompensated alcoholic cirrhosis with ascites, portal hypertensive gastropathy, history of SBP, initial episode 7/21 treated with ciprofloxacin, noncompliant with long-term prophylactic antibiotic, presents with recurrence of SBP  Plan:  Recurrence of SBP: Significant improvement in leukocytosis compared to the initial episode Antibiotic started on 11/15, switch to ciprofloxacin 500 mg twice daily today for 10days then cipro 500mg  daily long term for secondary prevention.  Discussed about importance of adherence to antibiotic as long as she tolerates to prevent recurrence of SBP Patient will need diagnostic paracentesis to reassess cell count with differential on day 5 of antibiotics and this can be pursued as outpatient   Volume overload/ascites: Improving Secondary to medication nonadherence Recommend to restart home dose diuretics Lasix 60 mg daily and spironolactone 150 mg daily today as patient  is responding well to diuretics  Depression in setting of decompensated cirrhosis Recommend trial of low-dose citalopram 10 mg daily, discussed with patient and she is agreeable  Recommend PT/OT consult Out of bed to chair Ambulate as tolerated    LOS: 2 days   Kandra Graven 10/08/2020, 11:47 AM

## 2020-10-08 NOTE — Hospital Course (Signed)
Lindsey Huang is a 39 y.o. female with medical history significant for alcoholic liver cirrhosis who follows up at [facility] and is status post paracentesis about 3 days prior to her hospitalization.  Patient's ascitic fluid had > 3000 PMN's and patient was contacted by her gastroenterologist and advised to come to the emergency room for treatment of spontaneous bacterial peritonitis.  She complained of severe abdominal pain.   She was admitted for spontaneous bacterial peritonitis.  Treated with empiric IV antibiotics.

## 2020-10-08 NOTE — TOC Initial Note (Signed)
Transition of Care [facility]) - Initial/Assessment Note    Patient Details  Name: Lindsey Huang MRN: [id number] Date of Birth: June 22, 1981  [facility]) CM/SW Contact:    [name], RN Phone Number: 10/08/2020, 9:20 AM  Clinical Narrative:   Patient with high risk for readmission score                 Patient admitted for SBP Patient states that she lives at home with her children and a roommate Local PCP was Lacie Scotts, but he retired and she has been assigned a new MD at the practice but does not recall the MD name.  She states that she received most of her care at [facility] and is current there   Pharmacy [facility] - denies issues obtaining medications Denies issues with transportation.   Patient denies any current alcohol of substance abuse  No anticipated needs for discharge.  Please consult TOC if indicated   Expected Discharge Plan: Home/Self Care Barriers to Discharge: Continued Medical Work up   Patient Goals and CMS Choice        Expected Discharge Plan and Services Expected Discharge Plan: Home/Self Care       Living arrangements for the past 2 months: Single Family Home                                      Prior Living Arrangements/Services Living arrangements for the past 2 months: Single Family Home Lives with:: Minor Children, Roommate Patient language and need for interpreter reviewed:: Yes Do you feel safe going back to the place where you live?: Yes      Need for Family Participation in Patient Care: No (Comment) Care giver support system in place?: Yes (comment)   Criminal Activity/Legal Involvement Pertinent to Current Situation/Hospitalization: No - Comment as needed  Activities of Daily Living Home Assistive Devices/Equipment: None ADL Screening (condition at time of admission) Patient's cognitive ability adequate to safely complete daily activities?: Yes Is the patient deaf or have difficulty hearing?: No Does the  patient have difficulty seeing, even when wearing glasses/contacts?: No Does the patient have difficulty concentrating, remembering, or making decisions?: No Patient able to express need for assistance with ADLs?: Yes Does the patient have difficulty dressing or bathing?: No Independently performs ADLs?: Yes (appropriate for developmental age) Does the patient have difficulty walking or climbing stairs?: No Weakness of Legs: Both Weakness of Arms/Hands: Both  Permission Sought/Granted                  Emotional Assessment Appearance:: Appears older than stated age     Orientation: : Oriented to Place, Oriented to Self, Oriented to  Time, Oriented to Situation   Psych Involvement: No (comment)  Admission diagnosis:  Spontaneous bacterial peritonitis (HCC) [K65.2] SBP (spontaneous bacterial peritonitis) (HCC) [K65.2] Patient Active Problem List   Diagnosis Date Noted  . Spontaneous bacterial peritonitis (HCC) 05/28/2020  . Alcoholic cirrhosis of liver with ascites (HCC) 05/27/2020  . SBP (spontaneous bacterial peritonitis) (HCC) 05/27/2020  . Hyponatremia 05/27/2020  . Hyperbilirubinemia 05/27/2020  . Normocytic anemia 05/27/2020  . Decompensated hepatic cirrhosis (HCC) 10/18/2019  . Hypokalemia 10/18/2019  . Coagulopathy (HCC) 10/18/2019  . Total bilirubin, elevated 10/18/2019  . Hematemesis 10/18/2019  . Liver failure (HCC) 09/11/2019  . Indication for care in labor or delivery 12/25/2013  . NVD (normal vaginal delivery) 12/25/2013   PCP:  Lacie Scotts,  [place], MD Pharmacy:   [facility] - [place], [place] - [phone], [address] [place] [address] Phone: [phone] Fax: [phone]  [facility] - [place], [place] - [address] [place] [address] Phone: [phone] Fax: [phone]  [id number] - [place], [place] - [address] [place]  [address] Phone: [phone] Fax: [phone]     Social Determinants of Health (SDOH) Interventions    Readmission Risk Interventions Readmission Risk Prevention Plan 10/08/2020  Transportation Screening Complete  HRI or Home Care Consult (No Data)  Palliative Care Screening Not Applicable  Medication Review (RN Care Manager) Complete  Some recent data might be hidden

## 2020-10-08 NOTE — Progress Notes (Signed)
PROGRESS NOTE    Lindsey Huang   UJW:[id number]  DOB: Jul 26, 1981  PCP: Evelene CroonNiemeyer, Meindert, MD    DOA: 10/06/2020 LOS: 2   Brief Narrative   Lindsey SeveranceKelly Huang is a 39 y.o. female with medical history significant for alcoholic liver cirrhosis who [facility] and is status post paracentesis about 3 days prior to her hospitalization.  Patient's ascitic fluid had > 3000 PMN's and patient was contacted by her gastroenterologist and advised to come to the emergency room for treatment of spontaneous bacterial peritonitis.  She complained of severe abdominal pain.   She was admitted for spontaneous bacterial peritonitis.  Treated with empiric IV antibiotics.     Assessment & Plan   Principal Problem:   SBP (spontaneous bacterial peritonitis) (HCC) Active Problems:   Hypokalemia   Coagulopathy (HCC)   Alcoholic cirrhosis of liver with ascites (HCC)   Recurrent SBP - POA.  Had outpatient paracentesis as outlined above.   --GI following --Lasix and Aldactone are being increased --Has been on IV Rocephin --Transitioned to PO Cipro today - 500 mg BID x 10 days, then 500 mg daily long term for SBP prophylaxis. --Diagnostic paracentesis at day 5 of antibiotics (can be outpatient) --GI follow up outpatient  Alcoholic Liver Cirrhosis complicated by Ascites, Thrombocytopenia, Coagulopathy - Ascites improving with diuretics.  Monitor CBC, CMP.  Continue diuretics, lactulose.  Hypotension - chronic, secondary to liver disease.  Continue midodrine.  Hypokalemia - replaced.  Monitor BMP and replace further if needed.  Anemia of chronic disease - Hbg is stable.  Monitor CBC.  Depression - in setting of decompensated cirrhosis.  Agreeable to try antidepressant.  Will start low dose Celexa.  Outpatient follow up with primary care.    Generalized weakness - due to acute illness.  Cleared by PT and OT, no therapy needs.    DVT prophylaxis: SCDs Start: 10/06/20 1507   Diet:  Diet  Orders (From admission, onward)    Start     Ordered   10/06/20 1508  Diet 2 gram sodium Room service appropriate? Yes; Fluid consistency: Thin  Diet effective now       Question Answer Comment  Room service appropriate? Yes   Fluid consistency: Thin      10/06/20 1508            Code Status: Full Code    Subjective 10/08/20    Pt seen this AM.  Reports overall feeling okay.  Was upset earlier, feeling better now that she understands her plan of care.  She had requested repeat paracentesis but after talking to GI earlier is satisfied with plan for increasing diuretics to get rid of ascites and avoid re-infecting.  She really hopes to go home tomorrow.   Disposition Plan & Communication   Status is: Inpatient  Remains inpatient appropriate because:Inpatient level of care appropriate due to severity of illness , increasing diuretics for ascites with close monitoring if need for repeat paracentesis.    Dispo: The patient is from: Home              Anticipated d/c is to: Home              Anticipated d/c date is: 1 day              Patient currently is not medically stable to d/c.    Family Communication: none at bedside, pt able to update   Consults, Procedures, Significant Events   Consultants:   Gastroenterology  Procedures:   None  Antimicrobials:  Anti-infectives (From admission, onward)   Start     Dose/Rate Route Frequency Ordered Stop   10/09/20 0000  ciprofloxacin (CIPRO) IVPB 400 mg        400 mg 200 mL/hr over 60 Minutes Intravenous Every 12 hours 10/08/20 1145     10/08/20 1230  ciprofloxacin (CIPRO) IVPB 400 mg  Status:  Discontinued        400 mg 200 mL/hr over 60 Minutes Intravenous Every 12 hours 10/08/20 1144 10/08/20 1145   10/07/20 1000  cefTRIAXone (ROCEPHIN) 2 g in sodium chloride 0.9 % 100 mL IVPB  Status:  Discontinued        2 g 200 mL/hr over 30 Minutes Intravenous Every 24 hours 10/06/20 1604 10/08/20 1144   10/06/20 1300  cefTRIAXone  (ROCEPHIN) 2 g in sodium chloride 0.9 % 100 mL IVPB        2 g 200 mL/hr over 30 Minutes Intravenous  Once 10/06/20 1255 10/06/20 1426         Objective   Vitals:   10/08/20 0457 10/08/20 0818 10/08/20 1205 10/08/20 1551  BP: 100/68 100/67 97/66 102/60  Pulse: 86 78 73 72  Resp:  18 18 16   Temp: 98.2 F (36.8 C) 98.7 F (37.1 C) 98.7 F (37.1 C) 98.1 F (36.7 C)  TempSrc:    Oral  SpO2: 100% 100% 100% 100%  Weight:      Height:        Intake/Output Summary (Last 24 hours) at 10/08/2020 1708 Last data filed at 10/08/2020 0348 Gross per 24 hour  Intake 100 ml  Output --  Net 100 ml   Filed Weights   10/06/20 1102  Weight: 63.7 kg    Physical Exam:  General exam: awake, alert, no acute distress HEENT: scleral icterus, moist mucus membranes, hearing grossly normal  Respiratory system: CTAB, no wheezes, rales or rhonchi, normal respiratory effort. Cardiovascular system: normal S1/S2, RRR, no pedal edema.   Gastrointestinal system: soft, mildly tender, mild distension, +bowel sounds. Central nervous system: A&O x4. no gross focal neurologic deficits, normal speech Extremities: moves all, no edema, normal tone Psychiatry: normal mood, congruent affect, judgement and insight appear normal  Labs   Data Reviewed: I have personally reviewed following labs and imaging studies  CBC: Recent Labs  Lab 10/06/20 1108 10/07/20 0645 10/08/20 0708  WBC 6.2 7.3 6.7  NEUTROABS  --   --  4.7  HGB 8.8* 9.2* 7.9*  HCT 25.8* 27.2* 23.7*  MCV 103.6* 104.6* 106.3*  PLT 78* 85* 72*   Basic Metabolic Panel: Recent Labs  Lab 10/06/20 1108 10/07/20 0645 10/08/20 0708  NA 136 136 135  K 3.3* 4.1 4.0  CL 106 104 103  CO2 20* 24 24  GLUCOSE 94 128* 110*  BUN 9 8 7   CREATININE 0.31* 0.42* 0.35*  CALCIUM 8.6* 8.6* 8.4*  MG  --   --  2.1  PHOS  --   --  4.5   GFR: Estimated Creatinine Clearance: 75.9 mL/min (A) (by C-G formula based on SCr of 0.35 mg/dL (L)). Liver  Function Tests: Recent Labs  Lab 10/06/20 1312  AST 58*  ALT 25  ALKPHOS 103  BILITOT 10.6*  PROT 6.4*  ALBUMIN 3.3*   No results for input(s): LIPASE, AMYLASE in the last 168 hours. Recent Labs  Lab 10/06/20 1312  AMMONIA 48*   Coagulation Profile: Recent Labs  Lab 10/06/20 1312  INR 2.0*   Cardiac  Enzymes: No results for input(s): CKTOTAL, CKMB, CKMBINDEX, TROPONINI in the last 168 hours. BNP (last 3 results) No results for input(s): PROBNP in the last 8760 hours. HbA1C: No results for input(s): HGBA1C in the last 72 hours. CBG: No results for input(s): GLUCAP in the last 168 hours. Lipid Profile: No results for input(s): CHOL, HDL, LDLCALC, TRIG, CHOLHDL, LDLDIRECT in the last 72 hours. Thyroid Function Tests: No results for input(s): TSH, T4TOTAL, FREET4, T3FREE, THYROIDAB in the last 72 hours. Anemia Panel: No results for input(s): VITAMINB12, FOLATE, FERRITIN, TIBC, IRON, RETICCTPCT in the last 72 hours. Sepsis Labs: Recent Labs  Lab 10/06/20 1312 10/06/20 1456  LATICACIDVEN 1.4 1.1    Recent Results (from the past 240 hour(s))  Body fluid culture     Status: None   Collection Time: 10/02/20  9:00 AM   Specimen: PATH Cytology Peritoneal fluid  Result Value Ref Range Status   Specimen Description   Final    PERITONEAL Performed at [facility], [address], [place], [place] [address]    Special Requests   Final    PERITONEAL Performed at [facility], [address] [place], [place] [address]    Gram Stain   Final    RARE WBC PRESENT, PREDOMINANTLY MONONUCLEAR NO ORGANISMS SEEN    Culture   Final    NO GROWTH Performed at [facility], [address], [place], [place] [address]    Report Status 10/05/2020 FINAL  Final  Culture, blood (routine x 2)     Status: None (Preliminary result)   Collection Time: 10/06/20  1:12 PM   Specimen: BLOOD  Result Value Ref Range Status   Specimen Description BLOOD LEFT AC  Final    Special Requests   Final    BOTTLES DRAWN AEROBIC AND ANAEROBIC Blood Culture adequate volume   Culture   Final    NO GROWTH 2 DAYS Performed at [facility], [address], [place], [place] [address]    Report Status PENDING  Incomplete  Culture, blood (routine x 2)     Status: None (Preliminary result)   Collection Time: 10/06/20  1:12 PM   Specimen: BLOOD  Result Value Ref Range Status   Specimen Description BLOOD RIGHT FA  Final   Special Requests   Final    BOTTLES DRAWN AEROBIC AND ANAEROBIC Blood Culture results may not be optimal due to an excessive volume of blood received in culture bottles   Culture   Final    NO GROWTH 2 DAYS Performed at [facility], [address], [place], [place] [address]    Report Status PENDING  Incomplete  Respiratory Panel by RT PCR (Flu A&B, Covid) - Nasopharyngeal Swab     Status: None   Collection Time: 10/06/20  2:58 PM   Specimen: Nasopharyngeal Swab  Result Value Ref Range Status   SARS Coronavirus 2 by RT PCR NEGATIVE NEGATIVE Final    Comment: (NOTE) SARS-CoV-2 target nucleic acids are NOT DETECTED.  The SARS-CoV-2 RNA is generally detectable in upper respiratoy specimens during the acute phase of infection. The lowest concentration of SARS-CoV-2 viral copies this assay can detect is 131 copies/mL. A negative result does not preclude SARS-Cov-2 infection and should not be used as the sole basis for treatment or other patient management decisions. A negative result may occur with  improper specimen collection/handling, submission of specimen other than nasopharyngeal swab, presence of viral mutation(s) within the areas targeted by this assay,  and inadequate number of viral copies (<131 copies/mL). A negative result must be combined with clinical observations, patient history, and epidemiological information. The expected result is Negative.  Fact Sheet for Patients:    https://www.moore.com/  Fact Sheet for Healthcare Providers:  https://www.young.biz/  This test is no t yet approved or cleared by the [place] FDA and  has been authorized for detection and/or diagnosis of SARS-CoV-2 by FDA under an Emergency Use Authorization (EUA). This EUA will remain  in effect (meaning this test can be used) for the duration of the COVID-19 declaration under Section 564(b)(1) of the Act, 21 U.S.C. section 360bbb-3(b)(1), unless the authorization is terminated or revoked sooner.     Influenza A by PCR NEGATIVE NEGATIVE Final   Influenza B by PCR NEGATIVE NEGATIVE Final    Comment: (NOTE) The Xpert Xpress SARS-CoV-2/FLU/RSV assay is intended as an aid in  the diagnosis of influenza from Nasopharyngeal swab specimens and  should not be used as a sole basis for treatment. Nasal washings and  aspirates are unacceptable for Xpert Xpress SARS-CoV-2/FLU/RSV  testing.  Fact Sheet for Patients: https://www.moore.com/  Fact Sheet for Healthcare Providers: https://www.young.biz/  This test is not yet approved or cleared by the [place] FDA and  has been authorized for detection and/or diagnosis of SARS-CoV-2 by  FDA under an Emergency Use Authorization (EUA). This EUA will remain  in effect (meaning this test can be used) for the duration of the  Covid-19 declaration under Section 564(b)(1) of the Act, 21  U.S.C. section 360bbb-3(b)(1), unless the authorization is  terminated or revoked. Performed at [facility], [address] [place], [place] [address]       Imaging Studies   No results found.   Medications   Scheduled Meds: . ferrous sulfate  325 mg Oral Q breakfast  . [START ON 10/09/2020] furosemide  60 mg Oral Daily  . lactulose  10 g Oral BID  . magnesium oxide  400 mg Oral Daily  . midodrine  10 mg Oral TID WC  . multivitamin with minerals  1  tablet Oral Daily  . potassium chloride  10 mEq Oral Daily  . sodium chloride flush  3 mL Intravenous Q12H  . [START ON 10/09/2020] spironolactone  150 mg Oral Daily  . thiamine  50 mg Oral Daily  . vitamin B-12  1,000 mcg Oral Daily   Continuous Infusions: . sodium chloride    . [START ON 10/09/2020] ciprofloxacin         LOS: 2 days    Time spent: 30 minutes with > 50% spent in coordination of care and direct patient contact.    Pennie Banter, DO [facility]  10/08/2020, 5:08 PM    If 7PM-7AM, please contact night-coverage. How to contact the [facility] Attending or Consulting provider 7A - 7P or covering provider during after hours 7P -7A, for this patient?    1. Check the care team in [facility] and look for a) attending/consulting [facility] provider listed and b) the [facility] team listed 2. Log into [email or web address] and use [place]'s universal password to access. If you do not have the password, please contact the hospital operator. 3. Locate the [facility] provider you are looking for under [facility] and page to a number that you can be directly reached. 4. If you still have difficulty reaching the provider, please page the [facility] (Director on Call) for the Hospitalists listed on amion for assistance.

## 2020-10-08 NOTE — Evaluation (Signed)
Occupational Therapy Evaluation Patient Details Name: Lindsey Huang MRN: [id number] DOB: 09-20-1981 Today's Date: 10/08/2020    History of Present Illness Pt is a 39 y/o F with PMH: decompensated alcoholic cirrhosis with ascites, portal hypertensive gastropathy, history of SBP, initial episode 7/21 treated with ciprofloxacin, noncompliant with long-term prophylactic antibiotic, presents with recurrence of SBP (spontaneous bacterial peritonitis). Has underwent paracentesis for culture.   Clinical Impression   Pt seen for OT evaluation this date in setting of acute hospitalization. 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 Pt reports she has 4 kids to take care of as well. Pt presents this date feeling better than when initially admitted and reports her primary issue at this time is not being able to wear her shorts/pants d/t abdominal discomfort from distension. Pt performs all ADLs/ADL mobility with no AD and no assist from therapist, does require some increased time secondary to the distension. Overall, pt is close to her functional baseline and tolerance with distension being the primary issue impacting her performance status. Pt tolerated well, do not anticipate need for continued OT f/u in acute setting or upon d/c.     Follow Up Recommendations  No OT follow up    Equipment Recommendations  None recommended by OT    Recommendations for Other Services       Precautions / Restrictions Precautions Precautions: Fall Restrictions Weight Bearing Restrictions: No      Mobility Bed Mobility Overal bed mobility: Independent                  Transfers Overall transfer level: Independent                    Balance Overall balance assessment: Independent                                         ADL either performed or assessed with clinical judgement   ADL Overall ADL's : Modified  independent;At baseline                                       General ADL Comments: requires some increased time d/t current abdominal distension, but is overall able to complete all ADL tasks/ADL mobility.     Vision Patient Visual Report: No change from baseline       Perception     Praxis      Pertinent Vitals/Pain Pain Assessment: 0-10 Pain Score: 4  Pain Location: abdomen Pain Descriptors / Indicators: Tightness Pain Intervention(s): Monitored during session     Hand Dominance     Extremity/Trunk Assessment Upper Extremity Assessment Upper Extremity Assessment: Overall WFL for tasks assessed   Lower Extremity Assessment Lower Extremity Assessment: Overall WFL for tasks assessed       Communication Communication Communication: No difficulties   Cognition Arousal/Alertness: Awake/alert Behavior During Therapy: WFL for tasks assessed/performed Overall Cognitive Status: Within Functional Limits for tasks assessed                                     General Comments  demos no LOB, good control    Exercises Other Exercises Other Exercises: OT facilitates ed re:  role of OT. Pt with good understanding.   Shoulder Instructions      Home Living Family/patient expects to be discharged to:: Private residence Living Arrangements: Children;Non-relatives/Friends Available Help at Discharge: Family;Available PRN/intermittently Type of Home: House                       Home Equipment: None          Prior Functioning/Environment Level of Independence: Independent        Comments: drives, coaches soccer, active lifestyle with 4 kids.        OT Problem List: Decreased strength;Pain      OT Treatment/Interventions: Self-care/ADL training    OT Goals(Current goals can be found in the care plan section) Acute Rehab OT Goals Patient Stated Goal: to go home OT Goal Formulation: All assessment and education complete,  DC therapy  OT Frequency:     Barriers to D/C:            Co-evaluation              AM-PAC OT "6 Clicks" Daily Activity     Outcome Measure Help from another person eating meals?: None Help from another person taking care of personal grooming?: None Help from another person toileting, which includes using toliet, bedpan, or urinal?: None Help from another person bathing (including washing, rinsing, drying)?: None Help from another person to put on and taking off regular upper body clothing?: None Help from another person to put on and taking off regular lower body clothing?: None 6 Click Score: 24   End of Session    Activity Tolerance: Patient tolerated treatment well Patient left: in bed;with call bell/phone within reach;Other (comment) (sitting upright in bed writing and coloring from bedside table.)  OT Visit Diagnosis: Muscle weakness (generalized) (M62.81)                Time: 8185-6314 OT Time Calculation (min): 23 min Charges:  OT General Charges $OT Visit: 1 Visit OT Evaluation $OT Eval Low Complexity: 1 Low OT Treatments $Self Care/Home Management : 8-22 mins  Rejeana Brock, MS, OTR/L ascom [phone] 10/08/20, 3:17 PM

## 2020-10-08 NOTE — Progress Notes (Signed)
PT Cancellation Note  Patient Details Name: Lindsey Huang MRN: [id number] DOB: 09-29-1981   Cancelled Treatment:    Reason Eval/Treat Not Completed: Other (comment) (PT spoke with pt who endorsed she is independent at baseline and has been I for mobility this hospital stay, no questions/concerns about her mobility at this time. PT to sign off.)  Olga Coaster PT, DPT 3:09 PM,10/08/20

## 2020-10-09 ENCOUNTER — Inpatient Hospital Stay: Payer: Medicaid Other

## 2020-10-09 LAB — COMPREHENSIVE METABOLIC PANEL
ALT: 27 U/L (ref 0–44)
AST: 63 U/L — ABNORMAL HIGH (ref 15–41)
Albumin: 2.9 g/dL — ABNORMAL LOW (ref 3.5–5.0)
Alkaline Phosphatase: 115 U/L (ref 38–126)
Anion gap: 8 (ref 5–15)
BUN: 6 mg/dL (ref 6–20)
CO2: 25 mmol/L (ref 22–32)
Calcium: 8.4 mg/dL — ABNORMAL LOW (ref 8.9–10.3)
Chloride: 101 mmol/L (ref 98–111)
Creatinine, Ser: 0.33 mg/dL — ABNORMAL LOW (ref 0.44–1.00)
GFR, Estimated: >60 mL/min (ref >60)
Glucose, Bld: 118 mg/dL — ABNORMAL HIGH (ref 70–99)
Potassium: 3.9 mmol/L (ref 3.5–5.1)
Sodium: 134 mmol/L — ABNORMAL LOW (ref 135–145)
Total Bilirubin: 7 mg/dL — ABNORMAL HIGH (ref 0.3–1.2)
Total Protein: 5.7 g/dL — ABNORMAL LOW (ref 6.5–8.1)

## 2020-10-09 LAB — CBC
HCT: 25.9 % — ABNORMAL LOW (ref 36.0–46.0)
Hemoglobin: 8.7 g/dL — ABNORMAL LOW (ref 12.0–15.0)
MCH: 35.5 pg — ABNORMAL HIGH (ref 26.0–34.0)
MCHC: 33.6 g/dL (ref 30.0–36.0)
MCV: 105.7 fL — ABNORMAL HIGH (ref 80.0–100.0)
Platelets: 77 10*3/uL — ABNORMAL LOW (ref 150–400)
RBC: 2.45 MIL/uL — ABNORMAL LOW (ref 3.87–5.11)
RDW: 13.7 % (ref 11.5–15.5)
WBC: 7.6 10*3/uL (ref 4.0–10.5)
nRBC: 0 % (ref 0.0–0.2)

## 2020-10-09 LAB — AMMONIA: Ammonia: 41 umol/L — ABNORMAL HIGH (ref 9–35)

## 2020-10-09 MED ORDER — FERROUS SULFATE 325 (65 FE) MG PO TABS: 325.0000 mg | ORAL_TABLET | Freq: Every day | ORAL | 3 refills | Status: AC

## 2020-10-09 MED ORDER — RIFAXIMIN 550 MG PO TABS
550.0000 mg | ORAL_TABLET | Freq: Two times a day (BID) | ORAL | Status: DC
  Administered 2020-10-09: 550 mg via ORAL
  Filled 2020-10-09 (×2): qty 1

## 2020-10-09 MED ORDER — OXYCODONE HCL 5 MG PO TABS
5.0000 mg | ORAL_TABLET | ORAL | 0 refills | Status: DC | PRN
Start: 2020-10-09 — End: 2021-01-03

## 2020-10-09 MED ORDER — CITALOPRAM HYDROBROMIDE 10 MG PO TABS
10.0000 mg | ORAL_TABLET | Freq: Every day | ORAL | 2 refills | Status: DC
Start: 2020-10-10 — End: 2020-11-25

## 2020-10-09 MED ORDER — RIFAXIMIN 550 MG PO TABS
550.0000 mg | ORAL_TABLET | Freq: Two times a day (BID) | ORAL | 2 refills | Status: DC
Start: 2020-10-09 — End: 2021-01-03

## 2020-10-09 MED ORDER — FUROSEMIDE 20 MG PO TABS: 60.0000 mg | ORAL_TABLET | Freq: Every day | ORAL | 2 refills | Status: AC

## 2020-10-09 MED ORDER — SPIRONOLACTONE 50 MG PO TABS
150.0000 mg | ORAL_TABLET | Freq: Every day | ORAL | 2 refills | Status: DC
Start: 2020-10-10 — End: 2021-01-11

## 2020-10-09 MED ORDER — POTASSIUM CHLORIDE CRYS ER 10 MEQ PO TBCR: 10.0000 meq | EXTENDED_RELEASE_TABLET | Freq: Every day | ORAL | 1 refills | Status: AC

## 2020-10-09 MED ORDER — CIPROFLOXACIN HCL 500 MG PO TABS: 500.0000 mg | ORAL_TABLET | Freq: Two times a day (BID) | ORAL | 2 refills | Status: AC

## 2020-10-09 NOTE — Discharge Summary (Signed)
Physician Discharge Summary  Lindsey Huang XNA:[id number] DOB: 1981/03/02 DOA: 10/06/2020  PCP: Evelene Croon, MD  Admit date: 10/06/2020 Discharge date: 10/09/2020  Admitted From: home Disposition:  home  Recommendations for Outpatient Follow-up:  1. Follow up with PCP in 1-2 weeks 2. Please obtain BMP/CBC in one week 3. Please follow up with your primary GI provider at [facility] within 1 week if possible  Home Health: No  Equipment/Devices: None   Discharge Condition: Stable  CODE STATUS: Full  Diet recommendation: Low sodium     Discharge Diagnoses: Principal Problem:   SBP (spontaneous bacterial peritonitis) (HCC) Active Problems:   Hypokalemia   Coagulopathy (HCC)   Alcoholic cirrhosis of liver with ascites (HCC)    Summary of HPI and Hospital Course:  Lindsey Huang is a 39 y.o. female with medical history significant for alcoholic liver cirrhosis who follows up at [facility] and is status post paracentesis about 3 days prior to her hospitalization.  Patient's ascitic fluid had > 3000 PMN's and patient was contacted by her gastroenterologist and advised to come to the emergency room for treatment of spontaneous bacterial peritonitis.  She complained of severe abdominal pain.   She was admitted for spontaneous bacterial peritonitis.  Treated with empiric IV antibiotics.   Recurrent SBP - POA.  Had outpatient paracentesis as outlined above.  GI was consulted.   Treated with IV Rocephin.   Transitioned to Cipro 500 mg BID x 10 days, then once daily for ppx. Diuretics: Lasix 60 mg and Aldactone 150 mg daily Diagnostic paracentesis outpatient to assess response to antibiotics.  Attempted repeat paracentesis prior to d/c but per radiology not enough ascites to do safely. Rifaximin started for hepatic encephalopathy, as ammonia level elevated and persisted.  Pt does not seem confused however. --GI follow up outpatient  Alcoholic Liver Cirrhosis complicated by Ascites,  Thrombocytopenia, Coagulopathy - Ascites improving with diuretics.  Continue diuretics, lactulose.  Hypotension - chronic, secondary to liver disease.  Continue midodrine.  Hypokalemia - replaced.  Outpatient BMP to monitor.  Anemia of chronic disease - Hbg is stable.  Monitor CBC in follow up.  Depression - in setting of decompensated cirrhosis.  Agreeable to try antidepressant.  Started on low dose Celexa.  Outpatient follow up with primary care.    Generalized weakness - due to acute illness.  Cleared by PT and OT, no therapy needs.     Discharge Instructions   Discharge Instructions    Call MD for:  extreme fatigue   Complete by: As directed    Call MD for:  persistant dizziness or light-headedness   Complete by: As directed    Call MD for:  persistant nausea and vomiting   Complete by: As directed    Call MD for:  severe uncontrolled pain   Complete by: As directed    Call MD for:  temperature >100.4   Complete by: As directed    Diet - low sodium heart healthy   Complete by: As directed    Discharge instructions   Complete by: As directed    Please take your medications exactly as prescribed.    A good way to tell if you're gaining fluid, aside from how your belly feels, is to weigh yourself daily.  If gaining weight, that is sign of fluid retention and diuretics could possibly be increased.    Please make close follow up appointment with your GI doctor at [facility].   Increase activity slowly   Complete by: As directed  No wound care   Complete by: As directed      Allergies as of 10/09/2020   No Known Allergies     Medication List    STOP taking these medications   potassium chloride 10 MEQ CR capsule Commonly known as: MICRO-K Replaced by: potassium chloride 10 MEQ tablet     TAKE these medications   ciprofloxacin 500 MG tablet Commonly known as: Cipro Take 1 tablet (500 mg total) by mouth 2 (two) times daily for 10 days. Then continue to take 500  mg by mouth ONCE daily.   citalopram 10 MG tablet Commonly known as: CELEXA Take 1 tablet (10 mg total) by mouth daily. Start taking on: October 10, 2020   ferrous sulfate 325 (65 FE) MG tablet Take 1 tablet (325 mg total) by mouth daily with breakfast.   furosemide 20 MG tablet Commonly known as: LASIX Take 3 tablets (60 mg total) by mouth daily. Start taking on: October 10, 2020 What changed: how much to take   lactulose 10 GM/15ML solution Commonly known as: CHRONULAC Take 15 mLs (10 g total) by mouth 2 (two) times daily. What changed:   when to take this  reasons to take this   magnesium oxide 400 MG tablet Commonly known as: MAG-OX Take 400 mg by mouth daily.   midodrine 10 MG tablet Commonly known as: PROAMATINE Take 1 tablet (10 mg total) by mouth 3 (three) times daily with meals.   multivitamin with minerals Tabs tablet Take 1 tablet by mouth daily.   oxyCODONE 5 MG immediate release tablet Commonly known as: Roxicodone Take 1-2 tablets (5-10 mg total) by mouth every 4 (four) hours as needed. 1 tablet for moderate pain, 2 tablets for severe pain What changed:   how much to take  when to take this  reasons to take this  additional instructions   pantoprazole 20 MG tablet Commonly known as: PROTONIX Take 20 mg by mouth daily.   potassium chloride 10 MEQ tablet Commonly known as: KLOR-CON Take 1 tablet (10 mEq total) by mouth daily. Start taking on: October 10, 2020 Replaces: potassium chloride 10 MEQ CR capsule   rifaximin 550 MG Tabs tablet Commonly known as: XIFAXAN Take 1 tablet (550 mg total) by mouth 2 (two) times daily.   spironolactone 50 MG tablet Commonly known as: ALDACTONE Take 3 tablets (150 mg total) by mouth daily. Start taking on: October 10, 2020   thiamine 50 MG tablet Commonly known as: VITAMIN B-1 Take 50 mg by mouth daily.   vitamin B-12 1000 MCG tablet Commonly known as: CYANOCOBALAMIN Take 1,000 mcg by mouth  daily.       No Known Allergies  Consultations:  Gastroenterology    Procedures/Studies: US Paracentesis  Result Date: 10/02/2020 INDICATION: Recurrent ascites EXAM: ULTRASOUND GUIDED THERAPEUTIC PARACENTESIS MEDICATIONS: None. COMPLICATIONS: None immediate. PROCEDURE: Informed written consent was obtained from the patient after a discussion of the risks, benefits and alternatives to treatment. A timeout was performed prior to the initiation of the procedure. Initial ultrasound scanning demonstrates a large amount of ascites within the right mid abdominal quadrant. The right lower abdomen was prepped and draped in the usual sterile fashion. 1% lidocaine was used for local anesthesia. Following this, a 6 Fr Safe-T-Centesis catheter was introduced. An ultrasound image was saved for documentation purposes. The paracentesis was performed. The catheter was removed and a dressing was applied. The patient tolerated the procedure well without immediate post procedural complication. Patient received post-procedure intravenous albumin;  see nursing notes for details. FINDINGS: A total of approximately 2.8 L of clear yellow fluid was removed. Samples were sent to the laboratory as requested by the clinical team. IMPRESSION: Successful ultrasound-guided paracentesis yielding 2.8 liters of peritoneal fluid. Electronically Signed   By: Alcide Clever M.D.   On: 10/02/2020 09:52   [place] ASCITES (ABDOMEN LIMITED)  Result Date: 10/09/2020 CLINICAL DATA:  Ascites. EXAM: LIMITED ABDOMEN ULTRASOUND FOR ASCITES TECHNIQUE: Limited ultrasound survey for ascites was performed in all four abdominal quadrants. COMPARISON:  10/02/2020. FINDINGS: Tiny amount of ascites present. Paracentesis not performed at this time. Repeat exam can be obtained as needed. IMPRESSION: Tiny amount of ascites present. Paracentesis not performed at this time. Electronically Signed   By: Maisie Fus  Register   On: 10/09/2020 15:25        Subjective: Pt doing well today.  No fevers.  Still some abdominal discomfort but much better.  Really anxious to get home.     Discharge Exam: Vitals:   10/09/20 1200 10/09/20 1511  BP: 107/66 100/64  Pulse: 74 70  Resp: 16   Temp: 98.2 F (36.8 C)   SpO2: 100% 99%   Vitals:   10/09/20 0605 10/09/20 0700 10/09/20 1200 10/09/20 1511  BP: (!) 100/59 93/69 107/66 100/64  Pulse: 81 74 74 70  Resp: Temp: 98 F (36.7 C) 98 F (36.7 C) 98.2 F (36.8 C)   TempSrc:      SpO2: 99% 99% 100% 99%  Weight:      Height:        General: Pt is alert, awake, not in acute distress Cardiovascular: RRR, S1/S2 +, no rubs, no gallops Respiratory: CTA bilaterally, no wheezing, no rhonchi Abdominal: Soft with mild distention, no tenderness or rebound, no fluid wave appreicated, bowel sounds + Extremities: no edema, no cyanosis    The results of significant diagnostics from this hospitalization (including imaging, microbiology, ancillary and laboratory) are listed below for reference.     Microbiology: Recent Results (from the past 240 hour(s))  Body fluid culture     Status: None   Collection Time: 10/02/20  9:00 AM   Specimen: PATH Cytology Peritoneal fluid  Result Value Ref Range Status   Specimen Description   Final    PERITONEAL Performed at [facility], [address] [place], [place] [address]    Special Requests   Final    PERITONEAL Performed at [facility], [address] [place], [place] [address]    Gram Stain   Final    RARE WBC PRESENT, PREDOMINANTLY MONONUCLEAR NO ORGANISMS SEEN    Culture   Final    NO GROWTH Performed at [facility], [address], [place], [place] [address]    Report Status 10/05/2020 FINAL  Final  Culture, blood (routine x 2)     Status: None (Preliminary result)   Collection Time: 10/06/20  1:12 PM   Specimen: BLOOD  Result Value Ref Range Status   Specimen Description BLOOD LEFT AC   Final   Special Requests   Final    BOTTLES DRAWN AEROBIC AND ANAEROBIC Blood Culture adequate volume   Culture   Final    NO GROWTH 3 DAYS Performed at [facility], [address] [place], [place] [address]    Report Status PENDING  Incomplete  Culture, blood (routine x 2)     Status: None (Preliminary result)   Collection Time: 10/06/20  1:12 PM  Specimen: BLOOD  Result Value Ref Range Status   Specimen Description BLOOD RIGHT FA  Final   Special Requests   Final    BOTTLES DRAWN AEROBIC AND ANAEROBIC Blood Culture results may not be optimal due to an excessive volume of blood received in culture bottles   Culture   Final    NO GROWTH 3 DAYS Performed at [facility], [address] [place], [place] [address]    Report Status PENDING  Incomplete  Respiratory Panel by RT PCR (Flu A&B, Covid) - Nasopharyngeal Swab     Status: None   Collection Time: 10/06/20  2:58 PM   Specimen: Nasopharyngeal Swab  Result Value Ref Range Status   SARS Coronavirus 2 by RT PCR NEGATIVE NEGATIVE Final    Comment: (NOTE) SARS-CoV-2 target nucleic acids are NOT DETECTED.  The SARS-CoV-2 RNA is generally detectable in upper respiratoy specimens during the acute phase of infection. The lowest concentration of SARS-CoV-2 viral copies this assay can detect is 131 copies/mL. A negative result does not preclude SARS-Cov-2 infection and should not be used as the sole basis for treatment or other patient management decisions. A negative result may occur with  improper specimen collection/handling, submission of specimen other than nasopharyngeal swab, presence of viral mutation(s) within the areas targeted by this assay, and inadequate number of viral copies (<131 copies/mL). A negative result must be combined with clinical observations, patient history, and epidemiological information. The expected result is Negative.  Fact Sheet for Patients:   https://www.moore.com/  Fact Sheet for Healthcare Providers:  https://www.young.biz/  This test is no t yet approved or cleared by the [place] FDA and  has been authorized for detection and/or diagnosis of SARS-CoV-2 by FDA under an Emergency Use Authorization (EUA). This EUA will remain  in effect (meaning this test can be used) for the duration of the COVID-19 declaration under Section 564(b)(1) of the Act, 21 U.S.C. section 360bbb-3(b)(1), unless the authorization is terminated or revoked sooner.     Influenza A by PCR NEGATIVE NEGATIVE Final   Influenza B by PCR NEGATIVE NEGATIVE Final    Comment: (NOTE) The Xpert Xpress SARS-CoV-2/FLU/RSV assay is intended as an aid in  the diagnosis of influenza from Nasopharyngeal swab specimens and  should not be used as a sole basis for treatment. Nasal washings and  aspirates are unacceptable for Xpert Xpress SARS-CoV-2/FLU/RSV  testing.  Fact Sheet for Patients: https://www.moore.com/  Fact Sheet for Healthcare Providers: https://www.young.biz/  This test is not yet approved or cleared by the [place] FDA and  has been authorized for detection and/or diagnosis of SARS-CoV-2 by  FDA under an Emergency Use Authorization (EUA). This EUA will remain  in effect (meaning this test can be used) for the duration of the  Covid-19 declaration under Section 564(b)(1) of the Act, 21  U.S.C. section 360bbb-3(b)(1), unless the authorization is  terminated or revoked. Performed at [facility], [address] [place], [place] [address]      Labs: BNP (last 3 results) No results for input(s): BNP in the last 8760 hours. Basic Metabolic Panel: Recent Labs  Lab 10/06/20 1108 10/07/20 0645 10/08/20 0708 10/09/20 0614  NA 136 136 135 134*  K 3.3* 4.1 4.0 3.9  CL 106 104 103 101  CO2 20* 24 24 25   GLUCOSE 94 128* 110* 118*  BUN 9 8 7 6    CREATININE 0.31* 0.42* 0.35* 0.33*  CALCIUM 8.6* 8.6* 8.4* 8.4*  MG  --   --  2.1  --   PHOS  --   --  4.5  --    Liver Function Tests: Recent Labs  Lab 10/06/20 1312 10/09/20 0614  AST 58* 63*  ALT 25 27  ALKPHOS 103 115  BILITOT 10.6* 7.0*  PROT 6.4* 5.7*  ALBUMIN 3.3* 2.9*   No results for input(s): LIPASE, AMYLASE in the last 168 hours. Recent Labs  Lab 10/06/20 1312 10/09/20 0614  AMMONIA 48* 41*   CBC: Recent Labs  Lab 10/06/20 1108 10/07/20 0645 10/08/20 0708 10/09/20 0614  WBC 6.2 7.3 6.7 7.6  NEUTROABS  --   --  4.7  --   HGB 8.8* 9.2* 7.9* 8.7*  HCT 25.8* 27.2* 23.7* 25.9*  MCV 103.6* 104.6* 106.3* 105.7*  PLT 78* 85* 72* 77*   Cardiac Enzymes: No results for input(s): CKTOTAL, CKMB, CKMBINDEX, TROPONINI in the last 168 hours. BNP: Invalid input(s): POCBNP CBG: No results for input(s): GLUCAP in the last 168 hours. D-Dimer No results for input(s): DDIMER in the last 72 hours. Hgb A1c No results for input(s): HGBA1C in the last 72 hours. Lipid Profile No results for input(s): CHOL, HDL, LDLCALC, TRIG, CHOLHDL, LDLDIRECT in the last 72 hours. Thyroid function studies No results for input(s): TSH, T4TOTAL, T3FREE, THYROIDAB in the last 72 hours.  Invalid input(s): FREET3 Anemia work up No results for input(s): VITAMINB12, FOLATE, FERRITIN, TIBC, IRON, RETICCTPCT in the last 72 hours. Urinalysis    Component Value Date/Time   COLORURINE AMBER (A) 05/27/2020 0757   APPEARANCEUR CLOUDY (A) 05/27/2020 0757   APPEARANCEUR Turbid 08/07/2012 0746   LABSPEC 1.016 05/27/2020 0757   LABSPEC 1.026 08/07/2012 0746   PHURINE 5.0 05/27/2020 0757   GLUCOSEU NEGATIVE 05/27/2020 0757   GLUCOSEU Negative 08/07/2012 0746   HGBUR SMALL (A) 05/27/2020 0757   BILIRUBINUR MODERATE (A) 05/27/2020 0757   BILIRUBINUR Negative 08/07/2012 0746   KETONESUR NEGATIVE 05/27/2020 0757   PROTEINUR NEGATIVE 05/27/2020 0757   NITRITE NEGATIVE 05/27/2020 0757    LEUKOCYTESUR NEGATIVE 05/27/2020 0757   LEUKOCYTESUR Negative 08/07/2012 0746   Sepsis Labs Invalid input(s): PROCALCITONIN,  WBC,  LACTICIDVEN Microbiology Recent Results (from the past 240 hour(s))  Body fluid culture     Status: None   Collection Time: 10/02/20  9:00 AM   Specimen: PATH Cytology Peritoneal fluid  Result Value Ref Range Status   Specimen Description   Final    PERITONEAL Performed at [facility], [address], [place], [place] [address]    Special Requests   Final    PERITONEAL Performed at [facility], [address] [place], [place] [address]    Gram Stain   Final    RARE WBC PRESENT, PREDOMINANTLY MONONUCLEAR NO ORGANISMS SEEN    Culture   Final    NO GROWTH Performed at [facility], [address], [place], [place] [address]    Report Status 10/05/2020 FINAL  Final  Culture, blood (routine x 2)     Status: None (Preliminary result)   Collection Time: 10/06/20  1:12 PM   Specimen: BLOOD  Result Value Ref Range Status   Specimen Description BLOOD LEFT AC  Final   Special Requests   Final    BOTTLES DRAWN AEROBIC AND ANAEROBIC Blood Culture adequate volume   Culture   Final    NO GROWTH 3 DAYS Performed at [facility], [address] [place], [place] [address]    Report Status PENDING  Incomplete  Culture, blood (routine  x 2)     Status: None (Preliminary result)   Collection Time: 10/06/20  1:12 PM   Specimen: BLOOD  Result Value Ref Range Status   Specimen Description BLOOD RIGHT FA  Final   Special Requests   Final    BOTTLES DRAWN AEROBIC AND ANAEROBIC Blood Culture results may not be optimal due to an excessive volume of blood received in culture bottles   Culture   Final    NO GROWTH 3 DAYS Performed at [facility], [address] [place], [place] [address]    Report Status PENDING  Incomplete  Respiratory Panel by RT PCR (Flu A&B, Covid) - Nasopharyngeal Swab     Status: None    Collection Time: 10/06/20  2:58 PM   Specimen: Nasopharyngeal Swab  Result Value Ref Range Status   SARS Coronavirus 2 by RT PCR NEGATIVE NEGATIVE Final    Comment: (NOTE) SARS-CoV-2 target nucleic acids are NOT DETECTED.  The SARS-CoV-2 RNA is generally detectable in upper respiratoy specimens during the acute phase of infection. The lowest concentration of SARS-CoV-2 viral copies this assay can detect is 131 copies/mL. A negative result does not preclude SARS-Cov-2 infection and should not be used as the sole basis for treatment or other patient management decisions. A negative result may occur with  improper specimen collection/handling, submission of specimen other than nasopharyngeal swab, presence of viral mutation(s) within the areas targeted by this assay, and inadequate number of viral copies (<131 copies/mL). A negative result must be combined with clinical observations, patient history, and epidemiological information. The expected result is Negative.  Fact Sheet for Patients:  https://www.moore.com/https://www.fda.gov/media/142436/download  Fact Sheet for Healthcare Providers:  https://www.young.biz/https://www.fda.gov/media/142435/download  This test is no t yet approved or cleared by the [place] FDA and  has been authorized for detection and/or diagnosis of SARS-CoV-2 by FDA under an Emergency Use Authorization (EUA). This EUA will remain  in effect (meaning this test can be used) for the duration of the COVID-19 declaration under Section 564(b)(1) of the Act, 21 U.S.C. section 360bbb-3(b)(1), unless the authorization is terminated or revoked sooner.     Influenza A by PCR NEGATIVE NEGATIVE Final   Influenza B by PCR NEGATIVE NEGATIVE Final    Comment: (NOTE) The Xpert Xpress SARS-CoV-2/FLU/RSV assay is intended as an aid in  the diagnosis of influenza from Nasopharyngeal swab specimens and  should not be used as a sole basis for treatment. Nasal washings and  aspirates are unacceptable for Xpert  Xpress SARS-CoV-2/FLU/RSV  testing.  Fact Sheet for Patients: https://www.moore.com/https://www.fda.gov/media/142436/download  Fact Sheet for Healthcare Providers: https://www.young.biz/https://www.fda.gov/media/142435/download  This test is not yet approved or cleared by the [place] FDA and  has been authorized for detection and/or diagnosis of SARS-CoV-2 by  FDA under an Emergency Use Authorization (EUA). This EUA will remain  in effect (meaning this test can be used) for the duration of the  Covid-19 declaration under Section 564(b)(1) of the Act, 21  U.S.C. section 360bbb-3(b)(1), unless the authorization is  terminated or revoked. Performed at [facility], [address] [place], [place] [address]      Time coordinating discharge: Over 30 minutes  SIGNED:   Pennie BanterKelly A Dottie Vaquerano, DO [facility] 10/09/2020, 4:34 PM   If 7PM-7AM, please contact night-coverage [email or web address]

## 2020-10-11 LAB — CULTURE, BLOOD (ROUTINE X 2)
Culture: NO GROWTH
Culture: NO GROWTH
Special Requests: ADEQUATE

## 2020-11-04 ENCOUNTER — Other Ambulatory Visit: Payer: Self-pay | Admitting: Gastroenterology

## 2020-11-04 DIAGNOSIS — K7031 Alcoholic cirrhosis of liver with ascites: Secondary | ICD-10-CM

## 2020-11-10 ENCOUNTER — Other Ambulatory Visit: Payer: Self-pay | Admitting: Gastroenterology

## 2020-11-10 ENCOUNTER — Other Ambulatory Visit: Payer: Self-pay

## 2020-11-10 ENCOUNTER — Ambulatory Visit
Admission: RE | Admit: 2020-11-10 | Discharge: 2020-11-10 | Disposition: A | Discharge status: Home / Self Care | Payer: Medicaid Other | Source: Ambulatory Visit | Attending: Gastroenterology | Admitting: Gastroenterology

## 2020-11-10 DIAGNOSIS — K7031 Alcoholic cirrhosis of liver with ascites: Secondary | ICD-10-CM | POA: Insufficient documentation

## 2020-11-23 ENCOUNTER — Emergency Department: Payer: Medicaid Other

## 2020-11-23 ENCOUNTER — Other Ambulatory Visit: Payer: Self-pay

## 2020-11-23 ENCOUNTER — Encounter: Payer: Self-pay | Admitting: Radiology

## 2020-11-23 ENCOUNTER — Inpatient Hospital Stay
Admission: EM | Admit: 2020-11-23 | Discharge: 2020-11-25 | DRG: 378 | Disposition: A | Discharge status: Home / Self Care | Payer: Medicaid Other | Attending: Internal Medicine | Admitting: Internal Medicine

## 2020-11-23 DIAGNOSIS — Z79899 Other long term (current) drug therapy: Secondary | ICD-10-CM

## 2020-11-23 DIAGNOSIS — Z818 Family history of other mental and behavioral disorders: Secondary | ICD-10-CM

## 2020-11-23 DIAGNOSIS — F32A Depression, unspecified: Secondary | ICD-10-CM | POA: Diagnosis present

## 2020-11-23 DIAGNOSIS — K7031 Alcoholic cirrhosis of liver with ascites: Secondary | ICD-10-CM | POA: Diagnosis present

## 2020-11-23 DIAGNOSIS — D689 Coagulation defect, unspecified: Secondary | ICD-10-CM | POA: Diagnosis present

## 2020-11-23 DIAGNOSIS — D6959 Other secondary thrombocytopenia: Secondary | ICD-10-CM | POA: Diagnosis present

## 2020-11-23 DIAGNOSIS — Z20822 Contact with and (suspected) exposure to covid-19: Secondary | ICD-10-CM | POA: Diagnosis present

## 2020-11-23 DIAGNOSIS — D638 Anemia in other chronic diseases classified elsewhere: Secondary | ICD-10-CM | POA: Diagnosis present

## 2020-11-23 DIAGNOSIS — K920 Hematemesis: Secondary | ICD-10-CM | POA: Diagnosis present

## 2020-11-23 DIAGNOSIS — K274 Chronic or unspecified peptic ulcer, site unspecified, with hemorrhage: Principal | ICD-10-CM | POA: Diagnosis present

## 2020-11-23 DIAGNOSIS — Z7682 Awaiting organ transplant status: Secondary | ICD-10-CM

## 2020-11-23 DIAGNOSIS — K7460 Unspecified cirrhosis of liver: Secondary | ICD-10-CM | POA: Diagnosis not present

## 2020-11-23 DIAGNOSIS — I8510 Secondary esophageal varices without bleeding: Secondary | ICD-10-CM | POA: Diagnosis present

## 2020-11-23 DIAGNOSIS — R7401 Elevation of levels of liver transaminase levels: Secondary | ICD-10-CM

## 2020-11-23 DIAGNOSIS — I959 Hypotension, unspecified: Secondary | ICD-10-CM | POA: Diagnosis present

## 2020-11-23 DIAGNOSIS — Z87891 Personal history of nicotine dependence: Secondary | ICD-10-CM | POA: Diagnosis not present

## 2020-11-23 DIAGNOSIS — K8020 Calculus of gallbladder without cholecystitis without obstruction: Secondary | ICD-10-CM | POA: Diagnosis present

## 2020-11-23 DIAGNOSIS — K3189 Other diseases of stomach and duodenum: Secondary | ICD-10-CM | POA: Diagnosis present

## 2020-11-23 DIAGNOSIS — K922 Gastrointestinal hemorrhage, unspecified: Secondary | ICD-10-CM

## 2020-11-23 DIAGNOSIS — R791 Abnormal coagulation profile: Secondary | ICD-10-CM | POA: Diagnosis present

## 2020-11-23 DIAGNOSIS — D649 Anemia, unspecified: Secondary | ICD-10-CM

## 2020-11-23 DIAGNOSIS — K766 Portal hypertension: Secondary | ICD-10-CM | POA: Diagnosis present

## 2020-11-23 DIAGNOSIS — R17 Unspecified jaundice: Secondary | ICD-10-CM

## 2020-11-23 DIAGNOSIS — D696 Thrombocytopenia, unspecified: Secondary | ICD-10-CM | POA: Diagnosis present

## 2020-11-23 DIAGNOSIS — E871 Hypo-osmolality and hyponatremia: Secondary | ICD-10-CM

## 2020-11-23 DIAGNOSIS — K7290 Hepatic failure, unspecified without coma: Secondary | ICD-10-CM | POA: Diagnosis present

## 2020-11-23 DIAGNOSIS — D62 Acute posthemorrhagic anemia: Secondary | ICD-10-CM | POA: Diagnosis present

## 2020-11-23 DIAGNOSIS — E876 Hypokalemia: Secondary | ICD-10-CM

## 2020-11-23 DIAGNOSIS — R161 Splenomegaly, not elsewhere classified: Secondary | ICD-10-CM | POA: Diagnosis present

## 2020-11-23 LAB — URINALYSIS, COMPLETE (UACMP) WITH MICROSCOPIC
Bilirubin Urine: NEGATIVE
Glucose, UA: NEGATIVE mg/dL
Hgb urine dipstick: NEGATIVE
Ketones, ur: NEGATIVE mg/dL
Leukocytes,Ua: NEGATIVE
Nitrite: NEGATIVE
Protein, ur: NEGATIVE mg/dL
Specific Gravity, Urine: 1.01 (ref 1.005–1.030)
pH: 5 (ref 5.0–8.0)

## 2020-11-23 LAB — COMPREHENSIVE METABOLIC PANEL
ALT: 51 U/L — ABNORMAL HIGH (ref 0–44)
AST: 97 U/L — ABNORMAL HIGH (ref 15–41)
Albumin: 2.6 g/dL — ABNORMAL LOW (ref 3.5–5.0)
Alkaline Phosphatase: 90 U/L (ref 38–126)
Anion gap: 10 (ref 5–15)
BUN: 40 mg/dL — ABNORMAL HIGH (ref 6–20)
CO2: 25 mmol/L (ref 22–32)
Calcium: 8.6 mg/dL — ABNORMAL LOW (ref 8.9–10.3)
Chloride: 87 mmol/L — ABNORMAL LOW (ref 98–111)
Creatinine, Ser: 0.55 mg/dL (ref 0.44–1.00)
GFR, Estimated: >60 mL/min (ref >60)
Glucose, Bld: 141 mg/dL — ABNORMAL HIGH (ref 70–99)
Potassium: 3.6 mmol/L (ref 3.5–5.1)
Sodium: 122 mmol/L — ABNORMAL LOW (ref 135–145)
Total Bilirubin: 11.8 mg/dL — ABNORMAL HIGH (ref 0.3–1.2)
Total Protein: 5.8 g/dL — ABNORMAL LOW (ref 6.5–8.1)

## 2020-11-23 LAB — CBC
HCT: 19.6 % — ABNORMAL LOW (ref 36.0–46.0)
Hemoglobin: 6.8 g/dL — ABNORMAL LOW (ref 12.0–15.0)
MCH: 36.8 pg — ABNORMAL HIGH (ref 26.0–34.0)
MCHC: 34.7 g/dL (ref 30.0–36.0)
MCV: 105.9 fL — ABNORMAL HIGH (ref 80.0–100.0)
Platelets: 100 10*3/uL — ABNORMAL LOW (ref 150–400)
RBC: 1.85 MIL/uL — ABNORMAL LOW (ref 3.87–5.11)
RDW: 14.8 % (ref 11.5–15.5)
WBC: 12 10*3/uL — ABNORMAL HIGH (ref 4.0–10.5)
nRBC: 0 % (ref 0.0–0.2)

## 2020-11-23 LAB — PROTIME-INR
INR: 2.3 — ABNORMAL HIGH (ref 0.8–1.2)
Prothrombin Time: 24.6 seconds — ABNORMAL HIGH (ref 11.4–15.2)

## 2020-11-23 LAB — PREPARE RBC (CROSSMATCH)

## 2020-11-23 LAB — RESP PANEL BY RT-PCR (FLU A&B, COVID) ARPGX2
Influenza A by PCR: NEGATIVE
Influenza B by PCR: NEGATIVE
SARS Coronavirus 2 by RT PCR: NEGATIVE

## 2020-11-23 LAB — HEMOGLOBIN AND HEMATOCRIT, BLOOD
HCT: 23.8 % — ABNORMAL LOW (ref 36.0–46.0)
Hemoglobin: 8.5 g/dL — ABNORMAL LOW (ref 12.0–15.0)

## 2020-11-23 LAB — HCG, QUANTITATIVE, PREGNANCY: hCG, Beta Chain, Quant, S: <1 m[IU]/mL (ref <5)

## 2020-11-23 LAB — ETHANOL: Alcohol, Ethyl (B): <10 mg/dL (ref <10)

## 2020-11-23 LAB — PREGNANCY, URINE: Preg Test, Ur: NEGATIVE

## 2020-11-23 LAB — MAGNESIUM: Magnesium: 1.6 mg/dL — ABNORMAL LOW (ref 1.7–2.4)

## 2020-11-23 LAB — LIPASE, BLOOD: Lipase: 44 U/L (ref 11–51)

## 2020-11-23 MED ORDER — FOLIC ACID 5 MG/ML IJ SOLN
1.0000 mg | Freq: Every day | INTRAMUSCULAR | Status: DC
  Administered 2020-11-24: 1 mg via INTRAVENOUS
  Filled 2020-11-23: qty 0.2

## 2020-11-23 MED ORDER — MAGNESIUM SULFATE 2 GM/50ML IV SOLN
2.0000 g | Freq: Once | INTRAVENOUS | Status: AC
  Administered 2020-11-23: 2 g via INTRAVENOUS
  Filled 2020-11-23: qty 50

## 2020-11-23 MED ORDER — DEXTROSE-NACL 5-0.9 % IV SOLN: INTRAVENOUS | Status: DC

## 2020-11-23 MED ORDER — HYDROMORPHONE HCL 1 MG/ML IJ SOLN
0.5000 mg | Freq: Once | INTRAMUSCULAR | Status: AC
  Administered 2020-11-23: 0.5 mg via INTRAVENOUS
  Filled 2020-11-23: qty 1

## 2020-11-23 MED ORDER — SODIUM CHLORIDE 0.9 % IV SOLN
1.0000 g | Freq: Once | INTRAVENOUS | Status: AC
  Administered 2020-11-23: 1 g via INTRAVENOUS
  Filled 2020-11-23: qty 10

## 2020-11-23 MED ORDER — THIAMINE HCL 100 MG/ML IJ SOLN
100.0000 mg | Freq: Every day | INTRAMUSCULAR | Status: DC
  Administered 2020-11-24: 100 mg via INTRAVENOUS

## 2020-11-23 MED ORDER — ONDANSETRON HCL 4 MG/2ML IJ SOLN
4.0000 mg | Freq: Once | INTRAMUSCULAR | Status: AC
  Administered 2020-11-23: 4 mg via INTRAVENOUS
  Filled 2020-11-23: qty 2

## 2020-11-23 MED ORDER — SODIUM CHLORIDE 0.9% IV SOLUTION
Freq: Once | INTRAVENOUS | Status: AC
  Filled 2020-11-23: qty 250

## 2020-11-23 MED ORDER — SODIUM CHLORIDE 0.9 % IV SOLN
8.0000 mg/h | INTRAVENOUS | Status: DC
  Administered 2020-11-24: 8 mg/h via INTRAVENOUS
  Filled 2020-11-23: qty 80

## 2020-11-23 MED ORDER — ONDANSETRON HCL 4 MG/2ML IJ SOLN: 4.0000 mg | Freq: Four times a day (QID) | INTRAMUSCULAR | Status: DC | PRN

## 2020-11-23 MED ORDER — LACTATED RINGERS IV BOLUS
1000.0000 mL | Freq: Once | INTRAVENOUS | Status: AC
  Administered 2020-11-23: 1000 mL via INTRAVENOUS

## 2020-11-23 MED ORDER — PANTOPRAZOLE SODIUM 40 MG IV SOLR
40.0000 mg | Freq: Once | INTRAVENOUS | Status: AC
  Administered 2020-11-23: 40 mg via INTRAVENOUS
  Filled 2020-11-23: qty 40

## 2020-11-23 MED ORDER — SODIUM CHLORIDE 0.9 % IV SOLN
50.0000 ug/h | INTRAVENOUS | Status: DC
  Administered 2020-11-23: 50 ug/h via INTRAVENOUS
  Filled 2020-11-23 (×5): qty 1

## 2020-11-23 MED ORDER — IOHEXOL 350 MG/ML SOLN
100.0000 mL | Freq: Once | INTRAVENOUS | Status: AC | PRN
  Administered 2020-11-23: 100 mL via INTRAVENOUS

## 2020-11-23 MED ORDER — PANTOPRAZOLE SODIUM 40 MG IV SOLR: 40.0000 mg | Freq: Two times a day (BID) | INTRAVENOUS | Status: DC

## 2020-11-23 MED ORDER — SODIUM CHLORIDE 0.9 % IV SOLN
80.0000 mg | Freq: Once | INTRAVENOUS | Status: AC
  Administered 2020-11-24: 80 mg via INTRAVENOUS
  Filled 2020-11-23: qty 80

## 2020-11-23 MED ORDER — ALBUMIN HUMAN 5 % IV SOLN
12.5000 g | Freq: Once | INTRAVENOUS | Status: AC
  Administered 2020-11-24: 12.5 g via INTRAVENOUS
  Filled 2020-11-23: qty 250

## 2020-11-23 MED ORDER — ONDANSETRON HCL 4 MG PO TABS: 4.0000 mg | ORAL_TABLET | Freq: Four times a day (QID) | ORAL | Status: DC | PRN

## 2020-11-23 NOTE — ED Triage Notes (Addendum)
Pt arrived via POV with reports of lethargy and weakness x 2 days, pt states last night around 1030pm she had 2 episodes of bloody emesis as well. Pt has photos on phone of bloody emesis.  Pt has chronic liver issues. Pt states when she feels like this it is usually ascites but states she has not had any increase in abdominal girth.  Pt had recent CT and goes to [facility] for GI and Liver problems.

## 2020-11-23 NOTE — ED Provider Notes (Signed)
[facility] Emergency Department Provider Note  ____________________________________________   Event Date/Time   First MD Initiated Contact with Patient 11/23/20 1633     (approximate)  I have reviewed the triage vital signs and the nursing notes.   HISTORY  Chief Complaint Weakness (/)   HPI Lindsey Huang is a 40 y.o. female with medical history significant foralcoholic liver cirrhosis with recurrent SBP, anemia of chronic disease, depression, and generalized weakness who presents for assessment approximately 2 days of malaise and fatigue as well as some grossly bloody vomiting yesterday.  Patient states she still feels little nauseous but has not vomited today.  She states she feels a little dizzy and has had some worsening abdominal pain last couple days but does not think her abdominal is gotten any more distended or she has gained any significant weight.  Denies any blood in her stool or urinary symptoms.  Denies any chest pain, cough, shortness of breath, rash or extremity pain but does note she developed several bruises of the last couple of days and feels her skin is got more yellow.  No acute headache, earache, sore throat, fevers or other acute sick symptoms.  No clear alleviating or aggravating factors.  Patient states he has not had any alcohol in about 2 years.  Does not use any illegal drugs or smoke     Past Medical History:  Diagnosis Date  . Blood transfusion without reported diagnosis 2010   misscarriage  . Cirrhosis (HCC)   . Hx of incompetent cervix, currently pregnant   . Medical history non-contributory   . Portal hypertensive gastropathy (HCC)   . Thrombocytopenia [facility](HCC)     Patient Active Problem List   Diagnosis Date Noted  . Spontaneous bacterial peritonitis (HCC) 05/28/2020  . Alcoholic cirrhosis of liver with ascites (HCC) 05/27/2020  . SBP (spontaneous bacterial peritonitis) (HCC) 05/27/2020  . Hyponatremia 05/27/2020  .  Hyperbilirubinemia 05/27/2020  . Normocytic anemia 05/27/2020  . Decompensated hepatic cirrhosis (HCC) 10/18/2019  . Hypokalemia 10/18/2019  . Coagulopathy (HCC) 10/18/2019  . Total bilirubin, elevated 10/18/2019  . Hematemesis 10/18/2019  . Liver failure (HCC) 09/11/2019  . Indication for care in labor or delivery 12/25/2013  . NVD (normal vaginal delivery) 12/25/2013    Past Surgical History:  Procedure Laterality Date  . CERVICAL CERCLAGE     x4  . ESOPHAGOGASTRODUODENOSCOPY (EGD) WITH PROPOFOL N/A 10/19/2019   Procedure: ESOPHAGOGASTRODUODENOSCOPY (EGD) WITH PROPOFOL;  Surgeon: Wyline MoodAnna, Kiran, MD;  [facility];  Service: Gastroenterology;  Laterality: N/A;    Prior to Admission medications   Medication Sig Start Date End Date Taking? Authorizing Provider  citalopram (CELEXA) 10 MG tablet Take 1 tablet (10 mg total) by mouth daily. 10/10/20   Esaw GrandchildGriffith, Stesha A, DO  ferrous sulfate 325 (65 FE) MG tablet Take 1 tablet (325 mg total) by mouth daily with breakfast. 10/09/20   Esaw GrandchildGriffith, Kamerin A, DO  furosemide (LASIX) 20 MG tablet Take 3 tablets (60 mg total) by mouth daily. 10/10/20   Pennie BanterGriffith, Vearl A, DO  lactulose (CHRONULAC) 10 GM/15ML solution Take 15 mLs (10 g total) by mouth 2 (two) times daily. Patient taking differently: Take 10 g by mouth 2 (two) times daily as needed.  06/01/20   Osvaldo ShipperKrishnan, Gokul, MD  magnesium oxide (MAG-OX) 400 MG tablet Take 400 mg by mouth daily. Patient not taking: Reported on 10/06/2020    [provider]  midodrine (PROAMATINE) 10 MG tablet Take 1 tablet (10 mg total) by mouth  3 (three) times daily with meals. Patient not taking: Reported on 10/06/2020 06/01/20   Osvaldo Shipper, MD  Multiple Vitamin (MULTIVITAMIN WITH MINERALS) TABS tablet Take 1 tablet by mouth daily.    [provider]  oxyCODONE (ROXICODONE) 5 MG immediate release tablet Take 1-2 tablets (5-10 mg total) by mouth every 4 (four) hours as needed. 1 tablet for  moderate pain, 2 tablets for severe pain 10/09/20   Esaw Grandchild A, DO  pantoprazole (PROTONIX) 20 MG tablet Take 20 mg by mouth daily.    [provider]  potassium chloride SA (KLOR-CON) 10 MEQ tablet Take 1 tablet (10 mEq total) by mouth daily. 10/10/20   Pennie Banter, DO  rifaximin (XIFAXAN) 550 MG TABS tablet Take 1 tablet (550 mg total) by mouth 2 (two) times daily. 10/09/20   Pennie Banter, DO  spironolactone (ALDACTONE) 50 MG tablet Take 3 tablets (150 mg total) by mouth daily. 10/10/20   Esaw Grandchild A, DO  thiamine (VITAMIN B-1) 50 MG tablet Take 50 mg by mouth daily.    [provider]  vitamin B-12 (CYANOCOBALAMIN) 1000 MCG tablet Take 1,000 mcg by mouth daily. Patient not taking: Reported on 10/06/2020    [provider]    Allergies Patient has no known allergies.  Family History  Problem Relation Age of Onset  . Depression Maternal Aunt   . Alcohol abuse Neg Hx   . Arthritis Neg Hx   . Asthma Neg Hx   . Birth defects Neg Hx   . Cancer Neg Hx   . COPD Neg Hx   . Drug abuse Neg Hx   . Early death Neg Hx   . Diabetes Neg Hx   . Hearing loss Neg Hx   . Heart disease Neg Hx   . Hyperlipidemia Neg Hx   . Hypertension Neg Hx   . Kidney disease Neg Hx   . Learning disabilities Neg Hx   . Mental illness Neg Hx   . Mental retardation Neg Hx   . Miscarriages / Stillbirths Neg Hx   . Stroke Neg Hx   . Vision loss Neg Hx     Social History Social History   Tobacco Use  . Smoking status: Former Smoker    Types: Cigarettes    Quit date: 08/28/2019    Years since quitting: 1.2  . Smokeless tobacco: Never Used  Substance Use Topics  . Alcohol use: Not Currently    Comment: stopped in 08/2019  . Drug use: No    Review of Systems  Review of Systems  Constitutional: Positive for malaise/fatigue. Negative for chills and fever.  HENT: Negative for sore throat.   Eyes: Negative for pain.  Respiratory: Negative for cough and  stridor.   Cardiovascular: Negative for chest pain.  Gastrointestinal: Positive for abdominal pain, nausea and vomiting.  Skin: Negative for rash.  Neurological: Positive for dizziness and weakness. Negative for seizures, loss of consciousness and headaches.  Endo/Heme/Allergies: Bruises/bleeds easily.  Psychiatric/Behavioral: Negative for suicidal ideas.  All other systems reviewed and are negative.     ____________________________________________   PHYSICAL EXAM:  VITAL SIGNS: ED Triage Vitals  Enc Vitals Group     BP 11/23/20 1407 (!) 94/51     Pulse Rate 11/23/20 1407 98     Resp 11/23/20 1407 18     Temp 11/23/20 1407 98.6 F (37 C)     Temp Source 11/23/20 1407 Oral     SpO2 11/23/20 1407 100 %  Weight 11/23/20 1407 138 lb (62.6 kg)     Height 11/23/20 1407 4\' 11"  (1.499 m)     Head Circumference --      Peak Flow --      Pain Score 11/23/20 1425 8     Pain Loc --      Pain Edu? --      Excl. in GC? --    Vitals:   11/23/20 1853 11/23/20 1855  BP: (!) 94/54 (!) 94/54  Pulse: 87 82  Resp: 16 14  Temp: 98.6 F (37 C)   SpO2: 100% 100%   Physical Exam Vitals and nursing note reviewed.  Constitutional:      General: She is not in acute distress.    Appearance: She is well-developed and well-nourished. She is ill-appearing.  HENT:     Head: Normocephalic and atraumatic.     Right Ear: External ear normal.     Left Ear: External ear normal.     Nose: Nose normal.     Mouth/Throat:     Mouth: Mucous membranes are dry.  Eyes:     General: Scleral icterus present.  Cardiovascular:     Rate and Rhythm: Normal rate and regular rhythm.     Heart sounds: No murmur heard.   Pulmonary:     Effort: Pulmonary effort is normal. No respiratory distress.     Breath sounds: Normal breath sounds.  Abdominal:     Palpations: Abdomen is soft.     Tenderness: There is abdominal tenderness. There is no right CVA tenderness or left CVA tenderness.  Musculoskeletal:         General: No edema.     Cervical back: Neck supple. No rigidity.     Right lower leg: No edema.     Left lower leg: No edema.  Skin:    General: Skin is warm and dry.     Capillary Refill: Capillary refill takes 2 to 3 seconds.     Coloration: Skin is jaundiced.  Neurological:     Mental Status: She is alert and oriented to person, place, and time.  Psychiatric:        Mood and Affect: Mood and affect and mood normal.     Scattered ecchymosis over bilateral lower extremities. ____________________________________________   LABS (all labs ordered are listed, but only abnormal results are displayed)  Labs Reviewed  COMPREHENSIVE METABOLIC PANEL - Abnormal; Notable for the following components:      Result Value   Sodium 122 (*)    Chloride 87 (*)    Glucose, Bld 141 (*)    BUN 40 (*)    Calcium 8.6 (*)    Total Protein 5.8 (*)    Albumin 2.6 (*)    AST 97 (*)    ALT 51 (*)    Total Bilirubin 11.8 (*)    All other components within normal limits  CBC - Abnormal; Notable for the following components:   WBC 12.0 (*)    RBC 1.85 (*)    Hemoglobin 6.8 (*)    HCT 19.6 (*)    MCV 105.9 (*)    MCH 36.8 (*)    Platelets 100 (*)    All other components within normal limits  URINALYSIS, COMPLETE (UACMP) WITH MICROSCOPIC - Abnormal; Notable for the following components:   Color, Urine AMBER (*)    APPearance CLEAR (*)    Bacteria, UA RARE (*)    All other components within normal limits  MAGNESIUM -  Abnormal; Notable for the following components:   Magnesium 1.6 (*)    All other components within normal limits  PROTIME-INR - Abnormal; Notable for the following components:   Prothrombin Time 24.6 (*)    INR 2.3 (*)    All other components within normal limits  RESP PANEL BY RT-PCR (FLU A&B, COVID) ARPGX2  CULTURE, BLOOD (ROUTINE X 2)  CULTURE, BLOOD (ROUTINE X 2)  LIPASE, BLOOD  HCG, QUANTITATIVE, PREGNANCY  PREGNANCY, URINE  ETHANOL  HEMOGLOBIN AND HEMATOCRIT,  BLOOD  TYPE AND SCREEN  PREPARE RBC (CROSSMATCH)  PREPARE RBC (CROSSMATCH)   ____________________________________________  EKG  Sinus rhythm with a ventricular rate of 90, normal axis, unremarkable levels, no evidence of acute ischemia or significant delay arrhythmia.  ____________________________________________  RADIOLOGY  ED MD interpretation: No focal consolidation, effusion, edema, pneumothorax or other acute intrathoracic abnormality.  CTA abdomen pelvis shows no evidence of active bleeding but does show extensive paraesophageal and abdominal varices as well as enlarged spleen and gallstones without evidence of cholecystitis or other acute intra-abdominal pelvic pathology.   Official radiology report(s): DG Chest 2 View  Result Date: 11/23/2020 CLINICAL DATA:  Hemoptysis EXAM: CHEST - 2 VIEW COMPARISON:  05/26/2020 FINDINGS: The heart size and mediastinal contours are within normal limits. Both lungs are clear. The visualized skeletal structures are unremarkable. IMPRESSION: No active cardiopulmonary disease. Electronically Signed   By: Jasmine Pang M.D.   On: 11/23/2020 17:22   CT Angio Abd/Pel W and/or Wo Contrast  Result Date: 11/23/2020 CLINICAL DATA:  Lethargy and weakness for 2 days. Two episodes of bloody emesis. GI bleed. EXAM: CTA ABDOMEN AND PELVIS WITHOUT AND WITH CONTRAST TECHNIQUE: Multidetector CT imaging of the abdomen and pelvis was performed using the standard protocol during bolus administration of intravenous contrast. Multiplanar reconstructed images and MIPs were obtained and reviewed to evaluate the vascular anatomy. CONTRAST:  OMNIPAQUE IOHEXOL 350 MG/ML SOLN COMPARISON:  05/27/2020 FINDINGS: VASCULAR Aorta: Unenhanced images demonstrate scattered aortic calcifications. Cholelithiasis. Images obtained during arterial phase after contrast administration demonstrate normal caliber abdominal aorta. No aneurysm or dissection. No significant stenosis. Celiac:  Patent without evidence of aneurysm, dissection, vasculitis or significant stenosis. SMA: Patent without evidence of aneurysm, dissection, vasculitis or significant stenosis. Renals: Both renal arteries are patent without evidence of aneurysm, dissection, vasculitis, fibromuscular dysplasia or significant stenosis. IMA: Patent without evidence of aneurysm, dissection, vasculitis or significant stenosis. Inflow: Patent without evidence of aneurysm, dissection, vasculitis or significant stenosis. Proximal Outflow: Bilateral common femoral and visualized portions of the superficial and profunda femoral arteries are patent without evidence of aneurysm, dissection, vasculitis or significant stenosis. Veins: No obvious venous abnormality within the limitations of this arterial phase study. Review of the MIP images confirms the above findings. NON-VASCULAR Lower chest: The lung bases are clear. Hepatobiliary: Hepatic cirrhosis with enlarged lateral segment left lobe and nodular contour. Heterogeneous parenchymal pattern. No focal lesions identified. Portal veins are patent but there is evidence of portal hypertension with prominent periumbilical vein varices. Prominent paraesophageal and upper abdominal varices. Moderate abdominal and small pelvic ascites. Pancreas: Unremarkable. No pancreatic ductal dilatation or surrounding inflammatory changes. Spleen: Diffuse enlargement of the spleen.  No focal lesions. Adrenals/Urinary Tract: Adrenal glands are unremarkable. Kidneys are normal, without renal calculi, focal lesion, or hydronephrosis. Bladder is unremarkable. Stomach/Bowel: The stomach, small bowel, and colon are not abnormally distended. Stool fills the colon. No wall thickening or inflammatory changes are appreciated. No intraluminal contrast extravasation or pooling to suggest a site of gastrointestinal  hemorrhage. Lymphatic: Scattered abdominal and retroperitoneal lymph nodes are not pathologically enlarged, likely  reactive or related to cirrhosis. Reproductive: Uterus and bilateral adnexa are unremarkable. Other: No free air in the abdomen. Abdominal wall musculature appears intact. Musculoskeletal: No acute or significant osseous findings. IMPRESSION: No evidence of abdominal aortic aneurysm or dissection. No significant arterial stenosis. 1. Hepatic cirrhosis with evidence of portal hypertension, including paraesophageal, upper abdominal, and periumbilical varices. Splenic enlargement. 2. Moderate abdominal and small pelvic ascites. 3. Cholelithiasis without evidence of cholecystitis. 4. No evidence of bowel obstruction or inflammation. 5. No evidence of intraluminal contrast extravasation or pooling to suggest a site of gastrointestinal hemorrhage. Aortic Atherosclerosis (ICD10-I70.0). Electronically Signed   By: Burman Nieves M.D.   On: 11/23/2020 20:10    ____________________________________________   PROCEDURES  Procedure(s) performed (including Critical Care):  .1-3 Lead EKG Interpretation Performed by: Gilles Chiquito, MD Authorized by: Gilles Chiquito, MD     Interpretation: normal     ECG rate assessment: normal     Rhythm: sinus rhythm     Ectopy: none     Conduction: normal   .Critical Care Performed by: Gilles Chiquito, MD Authorized by: Gilles Chiquito, MD   Critical care provider statement:    Critical care time (minutes):  45   Critical care time was exclusive of:  Separately billable procedures and treating other patients   Critical care was necessary to treat or prevent imminent or life-threatening deterioration of the following conditions:  Circulatory failure   Critical care was time spent personally by me on the following activities:  Discussions with consultants, evaluation of patient's response to treatment, examination of patient, ordering and performing treatments and interventions, ordering and review of laboratory studies, ordering and review of radiographic  studies, pulse oximetry, re-evaluation of patient's condition, obtaining history from patient or surrogate and review of old charts     ____________________________________________   INITIAL IMPRESSION / ASSESSMENT AND PLAN / ED COURSE      Patient presents above to history exam for assessment of hematemesis as well as some worsening abdominal pain yesterday.  On arrival patient is hypotensive with a BP of 94/51 otherwise stable vital signs on room air.  Her abdomen is diffusely tender but not peritoneal and not significantly distended.  She is icteric and jaundiced.  Lungs are clear bilaterally.  Lipase of 41 is not consistent with acute pancreatitis.  CMP remarkable for acute hyponatremia with NA of 122 compared to 134 1 month ago as well as worsening liver function with albumin of 2.6, AST of 97, ALT of 51 and a T bili of 11.8 compared to seven 1 month ago.  Patient has no significant acidosis although her chloride is also slightly low at 87.  CBC remarkable for leukocytosis with WBC count of 12, anemia with hemoglobin of 6.8 compared to 8.10-month ago and thrombocytopenia of platelets of 177 1 month ago.  Magnesium is 1.6.  UA does not appear infected.  Pregnancy test is negative.  INR is 2.3.  Covid is negative.  CT obtained shows no evidence of active arterial bleeding but does show evidence of hepatic cirrhosis with portal hypertension including paraesophageal upper abdominal and periumbilical varices.  Patient also has an enlarged spleen and some moderate ascites as well as some cholelithiasis without evidence of acute cholecystitis.  No other acute intra-abdominal process.  No evidence of acute pancreatitis, appendicitis, diverticulitis or SBO.  Given history of alcohol abuse and cirrhosis concern for possible variceal  bleed.  In addition to Protonix patient was given a dose of Rocephin.  She was transfused a unit of blood as well as given some IV fluids and crossmatched for an additional  unit.  Did discuss patient presentation with on-call gastroenterologist Dr. Haig Prophet stated he would review patient's chart and would either see either this evening or tomorrow.  CTA abdomen pelvis also obtained to assess for evidence of active lower GI bleeding level lower suspicion at this time given patient did show me pictures of bright red blood in the toilet bowl where she vomited last night.   MELD-Na score: 31 at 11/23/2020  4:28 PM MELD score: 25 at 11/23/2020  4:28 PM Calculated from: Serum Creatinine: 0.55 mg/dL (Using min of 1 mg/dL) at 11/23/2020  2:38 PM Serum Sodium: 122 mmol/L (Using min of 125 mmol/L) at 11/23/2020  2:38 PM Total Bilirubin: 11.8 mg/dL at 11/23/2020  2:38 PM INR(ratio): 2.3 at 11/23/2020  4:28 PM Age: 40 years  ____________________________________________   FINAL CLINICAL IMPRESSION(S) / ED DIAGNOSES  Final diagnoses:  Gastrointestinal hemorrhage, unspecified gastrointestinal hemorrhage type  Hypomagnesemia  Hypokalemia  Hyponatremia  Transaminitis  Elevated bilirubin  Low hemoglobin  Liver failure (HCC)    Medications  0.9 %  sodium chloride infusion (Manually program via Guardrails IV Fluids) (has no administration in time range)  ondansetron (ZOFRAN) injection 4 mg (has no administration in time range)  magnesium sulfate IVPB 2 g 50 mL (0 g Intravenous Stopped 11/23/20 1947)  pantoprazole (PROTONIX) injection 40 mg (40 mg Intravenous Given 11/23/20 1756)  cefTRIAXone (ROCEPHIN) 1 g in sodium chloride 0.9 % 100 mL IVPB (0 g Intravenous Stopped 11/23/20 1821)  ondansetron (ZOFRAN) injection 4 mg (4 mg Intravenous Given 11/23/20 1820)  HYDROmorphone (DILAUDID) injection 0.5 mg (0.5 mg Intravenous Given 11/23/20 1820)  iohexol (OMNIPAQUE) 350 MG/ML injection 100 mL (100 mLs Intravenous Contrast Given 11/23/20 1931)  lactated ringers bolus 1,000 mL (1,000 mLs Intravenous New Bag/Given 11/23/20 1850)     ED Discharge Orders    None       Note:  This document was  prepared using Dragon voice recognition software and may include unintentional dictation errors.   Lucrezia Starch, MD 11/23/20 2019

## 2020-11-23 NOTE — Consult Note (Signed)
Consultation  Referring Provider:    Dr. Katrinka Blazing Admit date: 1/2 Consult date: 1/2         Reason for Consultation:    Hematemesis         HPI:   Lindsey Huang is a 40 y.o. lady with history of alcohol cirrhosis who presents with two episodes of coffee ground emesis (patient with pictures) with last episode being about 24 hours prior to this writing. She required blood transfusion on presentation for hemoglobin of 6.8 but responded appropriately. She denies any melena. No blood thinners, no NSAIDS. Has not drunk alcohol in over two years. Has had recent admissions for SBP. She is following with the [facility] transplant team for transplant evaluation but is not on the transplant list. Patient states she did not have nausea when the emesis occurred, just happened out of the blue. Denies any emesis.  Past Medical History:  Diagnosis Date  . Blood transfusion without reported diagnosis 2010   misscarriage  . Cirrhosis (HCC)   . Hx of incompetent cervix, currently pregnant   . Medical history non-contributory   . Portal hypertensive gastropathy (HCC)   . Thrombocytopenia (HCC)     Past Surgical History:  Procedure Laterality Date  . CERVICAL CERCLAGE     x4  . ESOPHAGOGASTRODUODENOSCOPY (EGD) WITH PROPOFOL N/A 10/19/2019   Procedure: ESOPHAGOGASTRODUODENOSCOPY (EGD) WITH PROPOFOL;  Surgeon: Wyline Mood, MD;  Location: [facility] ENDOSCOPY;  Service: Gastroenterology;  Laterality: N/A;    Family History  Problem Relation Age of Onset  . Depression Maternal Aunt   . Alcohol abuse Neg Hx   . Arthritis Neg Hx   . Asthma Neg Hx   . Birth defects Neg Hx   . Cancer Neg Hx   . COPD Neg Hx   . Drug abuse Neg Hx   . Early death Neg Hx   . Diabetes Neg Hx   . Hearing loss Neg Hx   . Heart disease Neg Hx   . Hyperlipidemia Neg Hx   . Hypertension Neg Hx   . Kidney disease Neg Hx   . Learning disabilities Neg Hx   . Mental illness Neg Hx   . Mental retardation Neg Hx   . Miscarriages / Stillbirths  Neg Hx   . Stroke Neg Hx   . Vision loss Neg Hx     Social History   Tobacco Use  . Smoking status: Former Smoker    Types: Cigarettes    Quit date: 08/28/2019    Years since quitting: 1.2  . Smokeless tobacco: Never Used  Substance Use Topics  . Alcohol use: Not Currently    Comment: stopped in 08/2019  . Drug use: No    Prior to Admission medications   Medication Sig Start Date End Date Taking? Authorizing Provider  citalopram (CELEXA) 10 MG tablet Take 1 tablet (10 mg total) by mouth daily. 10/10/20   Esaw Grandchild A, DO  ferrous sulfate 325 (65 FE) MG tablet Take 1 tablet (325 mg total) by mouth daily with breakfast. 10/09/20   Esaw Grandchild A, DO  furosemide (LASIX) 20 MG tablet Take 3 tablets (60 mg total) by mouth daily. 10/10/20   Pennie Banter, DO  lactulose (CHRONULAC) 10 GM/15ML solution Take 15 mLs (10 g total) by mouth 2 (two) times daily. Patient taking differently: Take 10 g by mouth 2 (two) times daily as needed.  06/01/20   Osvaldo Shipper, MD  magnesium oxide (MAG-OX) 400 MG tablet Take 400 mg by mouth  daily. Patient not taking: Reported on 10/06/2020    [provider]  midodrine (PROAMATINE) 10 MG tablet Take 1 tablet (10 mg total) by mouth 3 (three) times daily with meals. Patient not taking: Reported on 10/06/2020 06/01/20   Osvaldo Shipper, MD  Multiple Vitamin (MULTIVITAMIN WITH MINERALS) TABS tablet Take 1 tablet by mouth daily.    [provider]  oxyCODONE (ROXICODONE) 5 MG immediate release tablet Take 1-2 tablets (5-10 mg total) by mouth every 4 (four) hours as needed. 1 tablet for moderate pain, 2 tablets for severe pain 10/09/20   Esaw Grandchild A, DO  pantoprazole (PROTONIX) 20 MG tablet Take 20 mg by mouth daily.    [provider]  potassium chloride SA (KLOR-CON) 10 MEQ tablet Take 1 tablet (10 mEq total) by mouth daily. 10/10/20   Pennie Banter, DO  rifaximin (XIFAXAN) 550 MG TABS tablet Take 1 tablet (550 mg  total) by mouth 2 (two) times daily. 10/09/20   Pennie Banter, DO  spironolactone (ALDACTONE) 50 MG tablet Take 3 tablets (150 mg total) by mouth daily. 10/10/20   Esaw Grandchild A, DO  thiamine (VITAMIN B-1) 50 MG tablet Take 50 mg by mouth daily.    [provider]  vitamin B-12 (CYANOCOBALAMIN) 1000 MCG tablet Take 1,000 mcg by mouth daily. Patient not taking: Reported on 10/06/2020    [provider]    Current Facility-Administered Medications  Medication Dose Route Frequency Provider Last Rate Last Admin  . [START ON 11/24/2020] albumin human 5 % solution 12.5 g  12.5 g Intravenous Once Rometta Emery, MD      . Melene Muller ON 11/24/2020] dextrose 5 %-0.9 % sodium chloride infusion   Intravenous Continuous Rometta Emery, MD      . Melene Muller ON 11/24/2020] folic acid injection 1 mg  1 mg Intravenous Daily Garba, Mohammad L, MD      . octreotide (SANDOSTATIN) 500 mcg in sodium chloride 0.9 % 250 mL (2 mcg/mL) infusion  50 mcg/hr Intravenous Continuous Gilles Chiquito, MD 25 mL/hr at 11/23/20 2121 50 mcg/hr at 11/23/20 2121  . ondansetron (ZOFRAN) tablet 4 mg  4 mg Oral Q6H PRN Rometta Emery, MD       Or  . ondansetron (ZOFRAN) injection 4 mg  4 mg Intravenous Q6H PRN Rometta Emery, MD      . Melene Muller ON 11/24/2020] pantoprazole (PROTONIX) 80 mg in sodium chloride 0.9 % 100 mL (0.8 mg/mL) infusion  8 mg/hr Intravenous Continuous Rometta Emery, MD      . Melene Muller ON 11/24/2020] pantoprazole (PROTONIX) 80 mg in sodium chloride 0.9 % 100 mL IVPB  80 mg Intravenous Once Rometta Emery, MD      . Melene Muller ON 11/27/2020] pantoprazole (PROTONIX) injection 40 mg  40 mg Intravenous Q12H Rometta Emery, MD      . Melene Muller ON 11/24/2020] thiamine (B-1) injection 100 mg  100 mg Intravenous Daily Rometta Emery, MD        Allergies as of 11/23/2020  . (No Known Allergies)     Review of Systems:    All systems reviewed and negative except where noted in HPI.  Review of  Systems  Constitutional: Negative for chills and fever.  Respiratory: Negative for shortness of breath.   Gastrointestinal: Positive for abdominal pain and vomiting. Negative for blood in stool, constipation, diarrhea, heartburn and melena.  Neurological: Negative for focal weakness.  Psychiatric/Behavioral: Negative for substance abuse.  All other  systems reviewed and are negative.     Physical Exam:  Vital signs in last 24 hours: Temp:  [98.6 F (37 C)-98.7 F (37.1 C)] 98.7 F (37.1 C) (01/02 2303) Pulse Rate:  [80-98] 85 (01/02 2303) Resp:  [11-18] 16 (01/02 2303) BP: (86-104)/(51-62) 98/61 (01/02 2303) SpO2:  [97 %-100 %] 100 % (01/02 2303) Weight:  [61.7 kg-62.6 kg] 61.7 kg (01/02 2303)   General:   Pleasant  in NAD Head:  Normocephalic and atraumatic. Eyes:   Scleral icterus present Mouth: Mucosa pink moist, no lesions. Neck:  Supple; no masses felt Lungs:  No respiratory distress Heart:   Abdomen:   Flat, soft, nondistended. Very minimal ascites on exam Msk:  Strength 5/5. Symmetrical without gross deformities. Neurologic:  Alert and  oriented x4;  No asterixis Skin:  Jaundice Psych:  Alert and cooperative. Normal affect.  LAB RESULTS: Recent Labs    11/23/20 1438 11/23/20 2130  WBC 12.0*  --   HGB 6.8* 8.5*  HCT 19.6* 23.8*  PLT 100*  --    BMET Recent Labs    11/23/20 1438  NA 122*  K 3.6  CL 87*  CO2 25  GLUCOSE 141*  BUN 40*  CREATININE 0.55  CALCIUM 8.6*   LFT Recent Labs    11/23/20 1438  PROT 5.8*  ALBUMIN 2.6*  AST 97*  ALT 51*  ALKPHOS 90  BILITOT 11.8*   PT/INR Recent Labs    11/23/20 1628  LABPROT 24.6*  INR 2.3*    STUDIES: DG Chest 2 View  Result Date: 11/23/2020 CLINICAL DATA:  Hemoptysis EXAM: CHEST - 2 VIEW COMPARISON:  05/26/2020 FINDINGS: The heart size and mediastinal contours are within normal limits. Both lungs are clear. The visualized skeletal structures are unremarkable. IMPRESSION: No active  cardiopulmonary disease. Electronically Signed   By: Donavan Foil M.D.   On: 11/23/2020 17:22   CT Angio Abd/Pel W and/or Wo Contrast  Result Date: 11/23/2020 CLINICAL DATA:  Lethargy and weakness for 2 days. Two episodes of bloody emesis. GI bleed. EXAM: CTA ABDOMEN AND PELVIS WITHOUT AND WITH CONTRAST TECHNIQUE: Multidetector CT imaging of the abdomen and pelvis was performed using the standard protocol during bolus administration of intravenous contrast. Multiplanar reconstructed images and MIPs were obtained and reviewed to evaluate the vascular anatomy. CONTRAST:  117mL OMNIPAQUE IOHEXOL 350 MG/ML SOLN COMPARISON:  05/27/2020 FINDINGS: VASCULAR Aorta: Unenhanced images demonstrate scattered aortic calcifications. Cholelithiasis. Images obtained during arterial phase after contrast administration demonstrate normal caliber abdominal aorta. No aneurysm or dissection. No significant stenosis. Celiac: Patent without evidence of aneurysm, dissection, vasculitis or significant stenosis. SMA: Patent without evidence of aneurysm, dissection, vasculitis or significant stenosis. Renals: Both renal arteries are patent without evidence of aneurysm, dissection, vasculitis, fibromuscular dysplasia or significant stenosis. IMA: Patent without evidence of aneurysm, dissection, vasculitis or significant stenosis. Inflow: Patent without evidence of aneurysm, dissection, vasculitis or significant stenosis. Proximal Outflow: Bilateral common femoral and visualized portions of the superficial and profunda femoral arteries are patent without evidence of aneurysm, dissection, vasculitis or significant stenosis. Veins: No obvious venous abnormality within the limitations of this arterial phase study. Review of the MIP images confirms the above findings. NON-VASCULAR Lower chest: The lung bases are clear. Hepatobiliary: Hepatic cirrhosis with enlarged lateral segment left lobe and nodular contour. Heterogeneous parenchymal pattern.  No focal lesions identified. Portal veins are patent but there is evidence of portal hypertension with prominent periumbilical vein varices. Prominent paraesophageal and upper abdominal varices. Moderate abdominal and small  pelvic ascites. Pancreas: Unremarkable. No pancreatic ductal dilatation or surrounding inflammatory changes. Spleen: Diffuse enlargement of the spleen.  No focal lesions. Adrenals/Urinary Tract: Adrenal glands are unremarkable. Kidneys are normal, without renal calculi, focal lesion, or hydronephrosis. Bladder is unremarkable. Stomach/Bowel: The stomach, small bowel, and colon are not abnormally distended. Stool fills the colon. No wall thickening or inflammatory changes are appreciated. No intraluminal contrast extravasation or pooling to suggest a site of gastrointestinal hemorrhage. Lymphatic: Scattered abdominal and retroperitoneal lymph nodes are not pathologically enlarged, likely reactive or related to cirrhosis. Reproductive: Uterus and bilateral adnexa are unremarkable. Other: No free air in the abdomen. Abdominal wall musculature appears intact. Musculoskeletal: No acute or significant osseous findings. IMPRESSION: No evidence of abdominal aortic aneurysm or dissection. No significant arterial stenosis. 1. Hepatic cirrhosis with evidence of portal hypertension, including paraesophageal, upper abdominal, and periumbilical varices. Splenic enlargement. 2. Moderate abdominal and small pelvic ascites. 3. Cholelithiasis without evidence of cholecystitis. 4. No evidence of bowel obstruction or inflammation. 5. No evidence of intraluminal contrast extravasation or pooling to suggest a site of gastrointestinal hemorrhage. Aortic Atherosclerosis (ICD10-I70.0). Electronically Signed   By: Burman Nieves M.D.   On: 11/23/2020 20:10       Impression / Plan:   40 y/o lady with history of decompensated alcohol cirrhosis (ascites) who presents with coffee ground emesis but is otherwise  hemodynamically stable. Differential includes variceal, PUD, and the most likely is portal hypertensive gastropathy. Regardless she will need an EGD. MELD-Na is 31  - keep NPO, will plan on EGD first thing in the morning - continue PPI IV BID - continue octreotide - continue ceftriaxone 1 gram daily for 7 days - maintain two large bore IV's - if hemoglobin < 7 then transfuse, please do not over transfuse as this could result in rebound portal hypertensive and precipitate a bleed - daily CMP and daily INR - replete electrolytes aggressively - diagnostic para if able  Please call if any changes in clinical status.  Merlyn Lot MD, MPH [facility]

## 2020-11-23 NOTE — ED Notes (Signed)
Informed Marlena Clipper of bed assignment.

## 2020-11-23 NOTE — H&P (Signed)
History and Physical   Lindsey Huang WIO:[id number] DOB: 1981-10-29 DOA: 11/23/2020  Referring MD/NP/PA: Dr Antoine Primas  PCP: Waynard Reeds Suella Grove, MD   Outpatient Specialists: [facility]  Patient coming from: Home  Chief Complaint: Hematemesis  HPI: Lindsey Huang is a 40 y.o. female with medical history significant of alcoholic cirrhosis advanced who has known portal hypertension and is on transplant list program at [facility] coming in with episode of hematemesis yesterday.  Patient had recent spontaneous bacterial peritonitis in November.  She also had recent EGD few weeks ago that apparently did not show any active variceal bleed.  She came in with complaint of significant hematemesis yesterday.  Also some abdominal pain.  Patient's hemoglobin is notably down from previous 8.7-6.8 today.  She has also moderate ascites as well as significant hyponatremia and thrombocytopenia.  There is no active bleeding at this minute.  GI contacted and recommends admission for further work-up of hematemesis.  Patient denied any bright red blood per rectum.  She did notice some melena.  Denied any change in her diet.  Abdominal pain is mild at 3 out of 10 on and off.  No fever or chills.  Patient being admitted with upper GI bleed most likely variceal bleed..  ED Course: Temperature 98.7 blood pressure 86/52 pulse 90 respiratory rate of 18 oxygen sat 97% on room air.  White count is 12.0 hemoglobin 6.8 and platelet 100.  Sodium is 122 potassium 3.6 chloride 87 CO2 25 glucose 141 BUN is 40 creatinine is 0.55 calcium 8.6 gap of 10.  Magnesium is 1.6 with a lipase of 44.  AST 97 ALT 51 total protein 5.8.  Albumin is 2.6.  Patient being admitted with upper GI bleed with positive guaiac stool.  Review of Systems: As per HPI otherwise 10 point review of systems negative.    Past Medical History:  Diagnosis Date  . Blood transfusion without reported diagnosis 2010   misscarriage  . Cirrhosis  (HCC)   . Hx of incompetent cervix, currently pregnant   . Medical history non-contributory   . Portal hypertensive gastropathy (HCC)   . Thrombocytopenia (HCC)     Past Surgical History:  Procedure Laterality Date  . CERVICAL CERCLAGE     x4  . ESOPHAGOGASTRODUODENOSCOPY (EGD) WITH PROPOFOL N/A 10/19/2019   Procedure: ESOPHAGOGASTRODUODENOSCOPY (EGD) WITH PROPOFOL;  Surgeon: Wyline Mood, MD;  Location: [facility] ENDOSCOPY;  Service: Gastroenterology;  Laterality: N/A;     reports that she quit smoking about 14 months ago. Her smoking use included cigarettes. She has never used smokeless tobacco. She reports previous alcohol use. She reports that she does not use drugs.  No Known Allergies  Family History  Problem Relation Age of Onset  . Depression Maternal Aunt   . Alcohol abuse Neg Hx   . Arthritis Neg Hx   . Asthma Neg Hx   . Birth defects Neg Hx   . Cancer Neg Hx   . COPD Neg Hx   . Drug abuse Neg Hx   . Early death Neg Hx   . Diabetes Neg Hx   . Hearing loss Neg Hx   . Heart disease Neg Hx   . Hyperlipidemia Neg Hx   . Hypertension Neg Hx   . Kidney disease Neg Hx   . Learning disabilities Neg Hx   . Mental illness Neg Hx   . Mental retardation Neg Hx   . Miscarriages / Stillbirths Neg Hx   . Stroke Neg Hx   .  Vision loss Neg Hx      Prior to Admission medications   Medication Sig Start Date End Date Taking? Authorizing Provider  citalopram (CELEXA) 10 MG tablet Take 1 tablet (10 mg total) by mouth daily. 10/10/20   Esaw Grandchild A, DO  ferrous sulfate 325 (65 FE) MG tablet Take 1 tablet (325 mg total) by mouth daily with breakfast. 10/09/20   Esaw Grandchild A, DO  furosemide (LASIX) 20 MG tablet Take 3 tablets (60 mg total) by mouth daily. 10/10/20   Pennie Banter, DO  lactulose (CHRONULAC) 10 GM/15ML solution Take 15 mLs (10 g total) by mouth 2 (two) times daily. Patient taking differently: Take 10 g by mouth 2 (two) times daily as needed.  06/01/20    Osvaldo Shipper, MD  magnesium oxide (MAG-OX) 400 MG tablet Take 400 mg by mouth daily. Patient not taking: Reported on 10/06/2020    [provider]  midodrine (PROAMATINE) 10 MG tablet Take 1 tablet (10 mg total) by mouth 3 (three) times daily with meals. Patient not taking: Reported on 10/06/2020 06/01/20   Osvaldo Shipper, MD  Multiple Vitamin (MULTIVITAMIN WITH MINERALS) TABS tablet Take 1 tablet by mouth daily.    [provider]  oxyCODONE (ROXICODONE) 5 MG immediate release tablet Take 1-2 tablets (5-10 mg total) by mouth every 4 (four) hours as needed. 1 tablet for moderate pain, 2 tablets for severe pain 10/09/20   Esaw Grandchild A, DO  pantoprazole (PROTONIX) 20 MG tablet Take 20 mg by mouth daily.    [provider]  potassium chloride SA (KLOR-CON) 10 MEQ tablet Take 1 tablet (10 mEq total) by mouth daily. 10/10/20   Pennie Banter, DO  rifaximin (XIFAXAN) 550 MG TABS tablet Take 1 tablet (550 mg total) by mouth 2 (two) times daily. 10/09/20   Pennie Banter, DO  spironolactone (ALDACTONE) 50 MG tablet Take 3 tablets (150 mg total) by mouth daily. 10/10/20   Esaw Grandchild A, DO  thiamine (VITAMIN B-1) 50 MG tablet Take 50 mg by mouth daily.    [provider]  vitamin B-12 (CYANOCOBALAMIN) 1000 MCG tablet Take 1,000 mcg by mouth daily. Patient not taking: Reported on 10/06/2020    [provider]    Physical Exam: Vitals:   11/23/20 1830 11/23/20 1832 11/23/20 1853 11/23/20 1855  BP: (!) 86/52 (!) 94/51 (!) 94/54 (!) 94/54  Pulse: 91 84 87 82  Resp: 11 14 16 14   Temp:  98.7 F (37.1 C) 98.6 F (37 C)   TempSrc:  Oral Oral   SpO2: 99%  100% 100%  Weight:      Height:          Constitutional: Chronically ill looking, no distress Vitals:   11/23/20 1830 11/23/20 1832 11/23/20 1853 11/23/20 1855  BP: (!) 86/52 (!) 94/51 (!) 94/54 (!) 94/54  Pulse: 91 84 87 82  Resp: 11 14 16 14   Temp:  98.7 F (37.1 C) 98.6 F (37  C)   TempSrc:  Oral Oral   SpO2: 99%  100% 100%  Weight:      Height:       Eyes: PERRL, lids and conjunctivae pale ENMT: Mucous membranes are moist. Posterior pharynx clear of any exudate or lesions.Normal dentition.  Neck: normal, supple, no masses, no thyromegaly Respiratory: clear to auscultation bilaterally, no wheezing, no crackles. Normal respiratory effort. No accessory muscle use.  Cardiovascular: Regular rate and rhythm, no murmurs / rubs / gallops. No extremity edema.  2+ pedal pulses. No carotid bruits.  Abdomen: Moderately distended, positive striae, positive ascites, mild hepatosplenomegaly, positive bowel sounds, nontender.  Musculoskeletal: no clubbing / cyanosis. No joint deformity upper and lower extremities. Good ROM, no contractures. Normal muscle tone.  Skin: no rashes, lesions, ulcers. No induration Neurologic: CN 2-12 grossly intact. Sensation intact, DTR normal. Strength 5/5 in all 4.  Psychiatric: Normal judgment and insight. Alert and oriented x 3. Normal mood.     Labs on Admission: I have personally reviewed following labs and imaging studies  CBC: Recent Labs  Lab 11/23/20 1438  WBC 12.0*  HGB 6.8*  HCT 19.6*  MCV 105.9*  PLT 100*   Basic Metabolic Panel: Recent Labs  Lab 11/23/20 1438  NA 122*  K 3.6  CL 87*  CO2 25  GLUCOSE 141*  BUN 40*  CREATININE 0.55  CALCIUM 8.6*  MG 1.6*   GFR: Estimated Creatinine Clearance: 74.5 mL/min (by C-G formula based on SCr of 0.55 mg/dL). Liver Function Tests: Recent Labs  Lab 11/23/20 1438  AST 97*  ALT 51*  ALKPHOS 90  BILITOT 11.8*  PROT 5.8*  ALBUMIN 2.6*   Recent Labs  Lab 11/23/20 1438  LIPASE 44   No results for input(s): AMMONIA in the last 168 hours. Coagulation Profile: Recent Labs  Lab 11/23/20 1628  INR 2.3*   Cardiac Enzymes: No results for input(s): CKTOTAL, CKMB, CKMBINDEX, TROPONINI in the last 168 hours. BNP (last 3 results) No results for input(s): PROBNP in the  last 8760 hours. HbA1C: No results for input(s): HGBA1C in the last 72 hours. CBG: No results for input(s): GLUCAP in the last 168 hours. Lipid Profile: No results for input(s): CHOL, HDL, LDLCALC, TRIG, CHOLHDL, LDLDIRECT in the last 72 hours. Thyroid Function Tests: No results for input(s): TSH, T4TOTAL, FREET4, T3FREE, THYROIDAB in the last 72 hours. Anemia Panel: No results for input(s): VITAMINB12, FOLATE, FERRITIN, TIBC, IRON, RETICCTPCT in the last 72 hours. Urine analysis:    Component Value Date/Time   COLORURINE AMBER (A) 11/23/2020 1439   APPEARANCEUR CLEAR (A) 11/23/2020 1439   APPEARANCEUR Turbid 08/07/2012 0746   LABSPEC 1.010 11/23/2020 1439   LABSPEC 1.026 08/07/2012 0746   PHURINE 5.0 11/23/2020 1439   GLUCOSEU NEGATIVE 11/23/2020 1439   GLUCOSEU Negative 08/07/2012 0746   HGBUR NEGATIVE 11/23/2020 1439   BILIRUBINUR NEGATIVE 11/23/2020 1439   BILIRUBINUR Negative 08/07/2012 0746   KETONESUR NEGATIVE 11/23/2020 1439   PROTEINUR NEGATIVE 11/23/2020 1439   NITRITE NEGATIVE 11/23/2020 1439   LEUKOCYTESUR NEGATIVE 11/23/2020 1439   LEUKOCYTESUR Negative 08/07/2012 0746   Sepsis Labs: @LABRCNTIP (procalcitonin:4,lacticidven:4) ) Recent Results (from the past 240 hour(s))  Resp Panel by RT-PCR (Flu A&B, Covid) Nasopharyngeal Swab     Status: None   Collection Time: 11/23/20  5:47 PM   Specimen: Nasopharyngeal Swab; Nasopharyngeal(NP) swabs in vial transport medium  Result Value Ref Range Status   SARS Coronavirus 2 by RT PCR NEGATIVE NEGATIVE Final    Comment: (NOTE) SARS-CoV-2 target nucleic acids are NOT DETECTED.  The SARS-CoV-2 RNA is generally detectable in upper respiratory specimens during the acute phase of infection. The lowest concentration of SARS-CoV-2 viral copies this assay can detect is 138 copies/mL. A negative result does not preclude SARS-Cov-2 infection and should not be used as the sole basis for treatment or other patient management  decisions. A negative result may occur with  improper specimen collection/handling, submission of specimen other than nasopharyngeal swab, presence of viral mutation(s) within the areas targeted by  this assay, and inadequate number of viral copies(<138 copies/mL). A negative result must be combined with clinical observations, patient history, and epidemiological information. The expected result is Negative.  Fact Sheet for Patients:  BloggerCourse.comhttps://www.fda.gov/media/152166/download  Fact Sheet for Healthcare Providers:  SeriousBroker.ithttps://www.fda.gov/media/152162/download  This test is no t yet approved or cleared by the [place] FDA and  has been authorized for detection and/or diagnosis of SARS-CoV-2 by FDA under an Emergency Use Authorization (EUA). This EUA will remain  in effect (meaning this test can be used) for the duration of the COVID-19 declaration under Section 564(b)(1) of the Act, 21 U.S.C.section 360bbb-3(b)(1), unless the authorization is terminated  or revoked sooner.       Influenza A by PCR NEGATIVE NEGATIVE Final   Influenza B by PCR NEGATIVE NEGATIVE Final    Comment: (NOTE) The Xpert Xpress SARS-CoV-2/FLU/RSV plus assay is intended as an aid in the diagnosis of influenza from Nasopharyngeal swab specimens and should not be used as a sole basis for treatment. Nasal washings and aspirates are unacceptable for Xpert Xpress SARS-CoV-2/FLU/RSV testing.  Fact Sheet for Patients: BloggerCourse.comhttps://www.fda.gov/media/152166/download  Fact Sheet for Healthcare Providers: SeriousBroker.ithttps://www.fda.gov/media/152162/download  This test is not yet approved or cleared by the [place] FDA and has been authorized for detection and/or diagnosis of SARS-CoV-2 by FDA under an Emergency Use Authorization (EUA). This EUA will remain in effect (meaning this test can be used) for the duration of the COVID-19 declaration under Section 564(b)(1) of the Act, 21 U.S.C. section 360bbb-3(b)(1), unless the  authorization is terminated or revoked.  Performed at [facility], [address] [place], [place] [address]      Radiological Exams on Admission: DG Chest 2 View  Result Date: 11/23/2020 CLINICAL DATA:  Hemoptysis EXAM: CHEST - 2 VIEW COMPARISON:  05/26/2020 FINDINGS: The heart size and mediastinal contours are within normal limits. Both lungs are clear. The visualized skeletal structures are unremarkable. IMPRESSION: No active cardiopulmonary disease. Electronically Signed   By: Jasmine PangKim  Fujinaga M.D.   On: 11/23/2020 17:22   CT Angio Abd/Pel W and/or Wo Contrast  Result Date: 11/23/2020 CLINICAL DATA:  Lethargy and weakness for 2 days. Two episodes of bloody emesis. GI bleed. EXAM: CTA ABDOMEN AND PELVIS WITHOUT AND WITH CONTRAST TECHNIQUE: Multidetector CT imaging of the abdomen and pelvis was performed using the standard protocol during bolus administration of intravenous contrast. Multiplanar reconstructed images and MIPs were obtained and reviewed to evaluate the vascular anatomy. CONTRAST:  100mL OMNIPAQUE IOHEXOL 350 MG/ML SOLN COMPARISON:  05/27/2020 FINDINGS: VASCULAR Aorta: Unenhanced images demonstrate scattered aortic calcifications. Cholelithiasis. Images obtained during arterial phase after contrast administration demonstrate normal caliber abdominal aorta. No aneurysm or dissection. No significant stenosis. Celiac: Patent without evidence of aneurysm, dissection, vasculitis or significant stenosis. SMA: Patent without evidence of aneurysm, dissection, vasculitis or significant stenosis. Renals: Both renal arteries are patent without evidence of aneurysm, dissection, vasculitis, fibromuscular dysplasia or significant stenosis. IMA: Patent without evidence of aneurysm, dissection, vasculitis or significant stenosis. Inflow: Patent without evidence of aneurysm, dissection, vasculitis or significant stenosis. Proximal Outflow: Bilateral common femoral and visualized portions of the  superficial and profunda femoral arteries are patent without evidence of aneurysm, dissection, vasculitis or significant stenosis. Veins: No obvious venous abnormality within the limitations of this arterial phase study. Review of the MIP images confirms the above findings. NON-VASCULAR Lower chest: The lung bases are clear. Hepatobiliary: Hepatic cirrhosis with enlarged lateral segment left lobe and nodular contour. Heterogeneous parenchymal pattern. No focal lesions identified. Portal  veins are patent but there is evidence of portal hypertension with prominent periumbilical vein varices. Prominent paraesophageal and upper abdominal varices. Moderate abdominal and small pelvic ascites. Pancreas: Unremarkable. No pancreatic ductal dilatation or surrounding inflammatory changes. Spleen: Diffuse enlargement of the spleen.  No focal lesions. Adrenals/Urinary Tract: Adrenal glands are unremarkable. Kidneys are normal, without renal calculi, focal lesion, or hydronephrosis. Bladder is unremarkable. Stomach/Bowel: The stomach, small bowel, and colon are not abnormally distended. Stool fills the colon. No wall thickening or inflammatory changes are appreciated. No intraluminal contrast extravasation or pooling to suggest a site of gastrointestinal hemorrhage. Lymphatic: Scattered abdominal and retroperitoneal lymph nodes are not pathologically enlarged, likely reactive or related to cirrhosis. Reproductive: Uterus and bilateral adnexa are unremarkable. Other: No free air in the abdomen. Abdominal wall musculature appears intact. Musculoskeletal: No acute or significant osseous findings. IMPRESSION: No evidence of abdominal aortic aneurysm or dissection. No significant arterial stenosis. 1. Hepatic cirrhosis with evidence of portal hypertension, including paraesophageal, upper abdominal, and periumbilical varices. Splenic enlargement. 2. Moderate abdominal and small pelvic ascites. 3. Cholelithiasis without evidence of  cholecystitis. 4. No evidence of bowel obstruction or inflammation. 5. No evidence of intraluminal contrast extravasation or pooling to suggest a site of gastrointestinal hemorrhage. Aortic Atherosclerosis (ICD10-I70.0). Electronically Signed   By: Lucienne Capers M.D.   On: 11/23/2020 20:10     Assessment/Plan Principal Problem:   Hematemesis Active Problems:   Decompensated hepatic cirrhosis (HCC)   Coagulopathy (HCC)   Alcoholic cirrhosis of liver with ascites (HCC)   Hyponatremia   Hyperbilirubinemia   Thrombocytopenia (HCC)   ABLA (acute blood loss anemia)   GI bleed     #1 hematemesis: Most likely secondary to upper GI bleed from varices.  Patient will be admitted.  Initiated Protonix drip.  Transfusion 1 unit of packed red blood cells.  GI consulted and will follow.  Serial H&H overnight.  #2 symptomatic anemia: Transfuse 1 unit of packed red blood cells on following up.  #3 alcoholic cirrhosis: Patient has apparently quit alcohol use.  Currently following with [facility].  #4 coagulopathy: INR is notably 2.3.  Secondary to liver disease.  Monitoring closely.  #5 hyponatremia: Most likely due to decompensated cirrhosis.:  Closely.  #6 moderate ascites: Therapeutic paracentesis ordered by ER.  Will follow  #7 thrombocytopenia: Secondary to decompensated cirrhosis.  Continue monitoring   DVT prophylaxis: SCD Code Status: Full code Family Communication: No family at bedside Disposition Plan: Home Consults called:  Dr. Haig Prophet gastroenterology Admission status: Inpatient  Severity of Illness: The appropriate patient status for this patient is INPATIENT. Inpatient status is judged to be reasonable and necessary in order to provide the required intensity of service to ensure the patient's safety. The patient's presenting symptoms, physical exam findings, and initial radiographic and laboratory data in the context of their chronic comorbidities is felt to place them  at high risk for further clinical deterioration. Furthermore, it is not anticipated that the patient will be medically stable for discharge from the hospital within 2 midnights of admission. The following factors support the patient status of inpatient.   " The patient's presenting symptoms include hematemesis. " The worrisome physical exam findings include pale and ascites. " The initial radiographic and laboratory data are worrisome because of hemoglobin 6.8. " The chronic co-morbidities include alcoholic cirrhosis.   * I certify that at the point of admission it is my clinical judgment that the patient will require inpatient hospital care spanning beyond 2 midnights from  the point of admission due to high intensity of service, high risk for further deterioration and high frequency of surveillance required.Lonia Blood MD [facility] Pager [phone]  If 7PM-7AM, please contact night-coverage [email or web address] Password [email or web address]  11/23/2020, 8:25 PM

## 2020-11-23 NOTE — ED Notes (Signed)
Pt reports two episodes of hematemesis in the past 24 hrs. Mild diarrhea, no blood but is on lactulose

## 2020-11-24 ENCOUNTER — Encounter
Admission: EM | Disposition: A | Discharge status: Home / Self Care | Payer: Self-pay | Source: Home / Self Care | Attending: Internal Medicine

## 2020-11-24 ENCOUNTER — Inpatient Hospital Stay: Payer: Medicaid Other

## 2020-11-24 ENCOUNTER — Inpatient Hospital Stay: Payer: Medicaid Other | Admitting: Certified Registered Nurse Anesthetist

## 2020-11-24 ENCOUNTER — Encounter: Payer: Self-pay | Admitting: Internal Medicine

## 2020-11-24 DIAGNOSIS — K920 Hematemesis: Secondary | ICD-10-CM | POA: Diagnosis not present

## 2020-11-24 HISTORY — PX: ESOPHAGOGASTRODUODENOSCOPY (EGD) WITH PROPOFOL: SHX5813

## 2020-11-24 LAB — CBC
HCT: 19.2 % — ABNORMAL LOW (ref 36.0–46.0)
HCT: 19.7 % — ABNORMAL LOW (ref 36.0–46.0)
HCT: 22.1 % — ABNORMAL LOW (ref 36.0–46.0)
HCT: 24.5 % — ABNORMAL LOW (ref 36.0–46.0)
Hemoglobin: 6.5 g/dL — ABNORMAL LOW (ref 12.0–15.0)
Hemoglobin: 7.3 g/dL — ABNORMAL LOW (ref 12.0–15.0)
Hemoglobin: 7.6 g/dL — ABNORMAL LOW (ref 12.0–15.0)
Hemoglobin: 8.9 g/dL — ABNORMAL LOW (ref 12.0–15.0)
MCH: 34 pg (ref 26.0–34.0)
MCH: 34.1 pg — ABNORMAL HIGH (ref 26.0–34.0)
MCH: 34.3 pg — ABNORMAL HIGH (ref 26.0–34.0)
MCH: 34.6 pg — ABNORMAL HIGH (ref 26.0–34.0)
MCHC: 33.9 g/dL (ref 30.0–36.0)
MCHC: 34.4 g/dL (ref 30.0–36.0)
MCHC: 36.3 g/dL — ABNORMAL HIGH (ref 30.0–36.0)
MCHC: 37.1 g/dL — ABNORMAL HIGH (ref 30.0–36.0)
MCV: 100.5 fL — ABNORMAL HIGH (ref 80.0–100.0)
MCV: 92.5 fL (ref 80.0–100.0)
MCV: 95.3 fL (ref 80.0–100.0)
MCV: 99.1 fL (ref 80.0–100.0)
Platelets: 37 10*3/uL — ABNORMAL LOW (ref 150–400)
Platelets: 57 10*3/uL — ABNORMAL LOW (ref 150–400)
Platelets: 68 10*3/uL — ABNORMAL LOW (ref 150–400)
Platelets: 82 10*3/uL — ABNORMAL LOW (ref 150–400)
RBC: 1.91 MIL/uL — ABNORMAL LOW (ref 3.87–5.11)
RBC: 2.13 MIL/uL — ABNORMAL LOW (ref 3.87–5.11)
RBC: 2.23 MIL/uL — ABNORMAL LOW (ref 3.87–5.11)
RBC: 2.57 MIL/uL — ABNORMAL LOW (ref 3.87–5.11)
RDW: 21 % — ABNORMAL HIGH (ref 11.5–15.5)
RDW: 21.5 % — ABNORMAL HIGH (ref 11.5–15.5)
RDW: 22.1 % — ABNORMAL HIGH (ref 11.5–15.5)
RDW: 22.4 % — ABNORMAL HIGH (ref 11.5–15.5)
WBC: 10.9 10*3/uL — ABNORMAL HIGH (ref 4.0–10.5)
WBC: 6.2 10*3/uL (ref 4.0–10.5)
WBC: 7.5 10*3/uL (ref 4.0–10.5)
WBC: 7.6 10*3/uL (ref 4.0–10.5)
nRBC: 0 % (ref 0.0–0.2)
nRBC: 0 % (ref 0.0–0.2)
nRBC: 0 % (ref 0.0–0.2)
nRBC: 0 % (ref 0.0–0.2)

## 2020-11-24 LAB — COMPREHENSIVE METABOLIC PANEL
ALT: 44 U/L (ref 0–44)
AST: 102 U/L — ABNORMAL HIGH (ref 15–41)
Albumin: 2.5 g/dL — ABNORMAL LOW (ref 3.5–5.0)
Alkaline Phosphatase: 70 U/L (ref 38–126)
Anion gap: 8 (ref 5–15)
BUN: 31 mg/dL — ABNORMAL HIGH (ref 6–20)
CO2: 23 mmol/L (ref 22–32)
Calcium: 8.3 mg/dL — ABNORMAL LOW (ref 8.9–10.3)
Chloride: 97 mmol/L — ABNORMAL LOW (ref 98–111)
Creatinine, Ser: 0.42 mg/dL — ABNORMAL LOW (ref 0.44–1.00)
GFR, Estimated: >60 mL/min (ref >60)
Glucose, Bld: 86 mg/dL (ref 70–99)
Potassium: 3.8 mmol/L (ref 3.5–5.1)
Sodium: 128 mmol/L — ABNORMAL LOW (ref 135–145)
Total Bilirubin: 11.1 mg/dL — ABNORMAL HIGH (ref 0.3–1.2)
Total Protein: 5.2 g/dL — ABNORMAL LOW (ref 6.5–8.1)

## 2020-11-24 LAB — BODY FLUID CELL COUNT WITH DIFFERENTIAL
Eos, Fluid: 0 %
Lymphs, Fluid: 42 %
Monocyte-Macrophage-Serous Fluid: 12 %
Neutrophil Count, Fluid: 46 %
Total Nucleated Cell Count, Fluid: 886 cu mm

## 2020-11-24 LAB — ALBUMIN, PLEURAL OR PERITONEAL FLUID: Albumin, Fluid: <1 g/dL

## 2020-11-24 LAB — GLUCOSE, PLEURAL OR PERITONEAL FLUID: Glucose, Fluid: 191 mg/dL

## 2020-11-24 LAB — HIV ANTIBODY (ROUTINE TESTING W REFLEX): HIV Screen 4th Generation wRfx: NONREACTIVE

## 2020-11-24 LAB — PROTEIN, PLEURAL OR PERITONEAL FLUID: Total protein, fluid: <3 g/dL

## 2020-11-24 LAB — PREPARE RBC (CROSSMATCH)

## 2020-11-24 SURGERY — ESOPHAGOGASTRODUODENOSCOPY (EGD) WITH PROPOFOL
Anesthesia: General

## 2020-11-24 MED ORDER — SODIUM CHLORIDE 0.9 % IV SOLN
1.0000 g | INTRAVENOUS | Status: DC
  Administered 2020-11-24: 1 g via INTRAVENOUS
  Filled 2020-11-24: qty 1
  Filled 2020-11-24: qty 10

## 2020-11-24 MED ORDER — RIFAXIMIN 550 MG PO TABS
550.0000 mg | ORAL_TABLET | Freq: Two times a day (BID) | ORAL | Status: DC
  Administered 2020-11-24 – 2020-11-25 (×3): 550 mg via ORAL
  Filled 2020-11-24 (×4): qty 1

## 2020-11-24 MED ORDER — DIPHENHYDRAMINE HCL 50 MG/ML IJ SOLN
12.5000 mg | Freq: Once | INTRAMUSCULAR | Status: AC
  Administered 2020-11-24: 12.5 mg via INTRAVENOUS
  Filled 2020-11-24: qty 1

## 2020-11-24 MED ORDER — SODIUM CHLORIDE 0.9 % IV SOLN: INTRAVENOUS | Status: DC

## 2020-11-24 MED ORDER — LIDOCAINE HCL (PF) 2 % IJ SOLN
INTRAMUSCULAR | Status: AC
  Filled 2020-11-24: qty 5

## 2020-11-24 MED ORDER — CITALOPRAM HYDROBROMIDE 20 MG PO TABS
10.0000 mg | ORAL_TABLET | Freq: Every day | ORAL | Status: DC
  Administered 2020-11-24 – 2020-11-25 (×2): 10 mg via ORAL
  Filled 2020-11-24 (×2): qty 1

## 2020-11-24 MED ORDER — SODIUM CHLORIDE 0.9% IV SOLUTION: Freq: Once | INTRAVENOUS | Status: DC

## 2020-11-24 MED ORDER — VITAMIN B-1 50 MG PO TABS
50.0000 mg | ORAL_TABLET | Freq: Every day | ORAL | Status: DC
  Administered 2020-11-25: 50 mg via ORAL
  Filled 2020-11-24 (×2): qty 1

## 2020-11-24 MED ORDER — FERROUS SULFATE 325 (65 FE) MG PO TABS
325.0000 mg | ORAL_TABLET | Freq: Every day | ORAL | Status: DC
  Administered 2020-11-25: 325 mg via ORAL
  Filled 2020-11-24: qty 1

## 2020-11-24 MED ORDER — PANTOPRAZOLE SODIUM 40 MG PO TBEC
40.0000 mg | DELAYED_RELEASE_TABLET | Freq: Two times a day (BID) | ORAL | Status: DC
  Administered 2020-11-24 – 2020-11-25 (×3): 40 mg via ORAL
  Filled 2020-11-24 (×3): qty 1

## 2020-11-24 MED ORDER — EPHEDRINE SULFATE 50 MG/ML IJ SOLN
INTRAMUSCULAR | Status: DC | PRN
  Administered 2020-11-24: 5 mg via INTRAVENOUS

## 2020-11-24 MED ORDER — PHENYLEPHRINE HCL (PRESSORS) 10 MG/ML IV SOLN
INTRAVENOUS | Status: DC | PRN
  Administered 2020-11-24 (×2): 100 ug via INTRAVENOUS
  Administered 2020-11-24: 200 ug via INTRAVENOUS

## 2020-11-24 MED ORDER — PROPOFOL 500 MG/50ML IV EMUL
INTRAVENOUS | Status: AC
  Filled 2020-11-24: qty 50

## 2020-11-24 MED ORDER — HYDROXYZINE HCL 10 MG PO TABS
10.0000 mg | ORAL_TABLET | Freq: Once | ORAL | Status: AC
  Administered 2020-11-24: 10 mg via ORAL
  Filled 2020-11-24: qty 1

## 2020-11-24 MED ORDER — LACTULOSE 10 GM/15ML PO SOLN
10.0000 g | Freq: Two times a day (BID) | ORAL | Status: DC
  Administered 2020-11-24 (×2): 10 g via ORAL
  Filled 2020-11-24 (×3): qty 30

## 2020-11-24 MED ORDER — PROPOFOL 500 MG/50ML IV EMUL
INTRAVENOUS | Status: DC | PRN
  Administered 2020-11-24: 150 ug/kg/min via INTRAVENOUS

## 2020-11-24 MED ORDER — ADULT MULTIVITAMIN W/MINERALS CH
1.0000 | ORAL_TABLET | Freq: Every day | ORAL | Status: DC
  Administered 2020-11-24 – 2020-11-25 (×2): 1 via ORAL
  Filled 2020-11-24 (×2): qty 1

## 2020-11-24 MED ORDER — LIDOCAINE HCL (CARDIAC) PF 100 MG/5ML IV SOSY
PREFILLED_SYRINGE | INTRAVENOUS | Status: DC | PRN
  Administered 2020-11-24: 50 mg via INTRAVENOUS

## 2020-11-24 MED ORDER — PROPOFOL 10 MG/ML IV BOLUS
INTRAVENOUS | Status: DC | PRN
  Administered 2020-11-24: 50 mg via INTRAVENOUS
  Administered 2020-11-24: 20 mg via INTRAVENOUS

## 2020-11-24 NOTE — Progress Notes (Signed)
Patient returned from paracentesis

## 2020-11-24 NOTE — Progress Notes (Signed)
Order received from Dr Allena Katz for clear liquid diet, advance as tolerated to full

## 2020-11-24 NOTE — Progress Notes (Signed)
[facility]  - [place] at [facility]   PATIENT NAME: Lindsey Huang    MR#:  [id number]  DATE OF BIRTH:  May 06, 1981  SUBJECTIVE:    REVIEW OF SYSTEMS:   ROS Tolerating Diet: Tolerating PT:   DRUG ALLERGIES:  No Known Allergies  VITALS:  Blood pressure 95/61, pulse 92, temperature 98.7 F (37.1 C), resp. rate 14, height 4\' 11"  (1.499 m), weight 61.7 kg, SpO2 100 %, unknown if currently breastfeeding.  PHYSICAL EXAMINATION:   Physical Exam  GENERAL:  40 y.o.-year-old patient lying in the bed with no acute distress.  HEENT: Head atraumatic, normocephalic. Oropharynx and nasopharynx clear.  NECK:  Supple, no jugular venous distention. No thyroid enlargement, no tenderness.  LUNGS: Normal breath sounds bilaterally, no wheezing, rales, rhonchi. No use of accessory muscles of respiration.  CARDIOVASCULAR: S1, S2 normal. No murmurs, rubs, or gallops.  ABDOMEN: Soft, nontender, nondistended. Bowel sounds present. No organomegaly or mass.  EXTREMITIES: No cyanosis, clubbing or edema b/l.    NEUROLOGIC: Cranial nerves II through XII are intact. No focal Motor or sensory deficits b/l.   PSYCHIATRIC:  patient is alert and oriented x 3.  SKIN: No obvious rash, lesion, or ulcer.   LABORATORY PANEL:  CBC Recent Labs  Lab 11/24/20 1255  WBC 6.2  HGB 6.5*  HCT 19.2*  PLT 57*    Chemistries  Recent Labs  Lab 11/23/20 1438 11/24/20 0538  NA 122* 128*  K 3.6 3.8  CL 87* 97*  CO2 25 23  GLUCOSE 141* 86  BUN 40* 31*  CREATININE 0.55 0.42*  CALCIUM 8.6* 8.3*  MG 1.6*  --   AST 97* 102*  ALT 51* 44  ALKPHOS 90 70  BILITOT 11.8* 11.1*   Cardiac Enzymes No results for input(s): TROPONINI in the last 168 hours. RADIOLOGY:  DG Chest 2 View  Result Date: 11/23/2020 CLINICAL DATA:  Hemoptysis EXAM: CHEST - 2 VIEW COMPARISON:  05/26/2020 FINDINGS: The heart size and mediastinal contours are within normal limits. Both lungs are clear. The visualized skeletal  structures are unremarkable. IMPRESSION: No active cardiopulmonary disease. Electronically Signed   By: 07/27/2020 M.D.   On: 11/23/2020 17:22   01/21/2021 Paracentesis  Result Date: 11/24/2020 INDICATION: Patient with history of alcoholic cirrhosis with recurrent ascites. Request is for therapeutic and diagnostic paracentesis EXAM: ULTRASOUND GUIDED THERAPEUTIC AND DIAGNOSTIC PARACENTESIS MEDICATIONS: Lidocaine 1% 15 mL COMPLICATIONS: None immediate. PROCEDURE: Informed written consent was obtained from the patient after a discussion of the risks, benefits and alternatives to treatment. A timeout was performed prior to the initiation of the procedure. Initial ultrasound scanning demonstrates a small amount of ascites within the right lower abdominal quadrant. The right lower abdomen was prepped and draped in the usual sterile fashion. 1% lidocaine was used for local anesthesia. Following this, a 19 gauge, 10-cm, Yueh catheter was introduced. An ultrasound image was saved for documentation purposes. The paracentesis was performed. The catheter was removed and Dermabond was applied. The patient tolerated the procedure well without immediate post procedural complication. FINDINGS: A total of approximately 350 mL of amber colored fluid was removed. Samples were sent to the laboratory as requested by the clinical team. IMPRESSION: Successful ultrasound-guided therapeutic paracentesis yielding 350 mL of peritoneal fluid. Read by: 01/22/2021, NP Electronically Signed   By: Anders Grant M.D.   On: 11/24/2020 15:31   CT Angio Abd/Pel W and/or Wo Contrast  Result Date: 11/23/2020 CLINICAL DATA:  Lethargy and weakness for 2 days.  Two episodes of bloody emesis. GI bleed. EXAM: CTA ABDOMEN AND PELVIS WITHOUT AND WITH CONTRAST TECHNIQUE: Multidetector CT imaging of the abdomen and pelvis was performed using the standard protocol during bolus administration of intravenous contrast. Multiplanar reconstructed images and MIPs  were obtained and reviewed to evaluate the vascular anatomy. CONTRAST:  OMNIPAQUE IOHEXOL 350 MG/ML SOLN COMPARISON:  05/27/2020 FINDINGS: VASCULAR Aorta: Unenhanced images demonstrate scattered aortic calcifications. Cholelithiasis. Images obtained during arterial phase after contrast administration demonstrate normal caliber abdominal aorta. No aneurysm or dissection. No significant stenosis. Celiac: Patent without evidence of aneurysm, dissection, vasculitis or significant stenosis. SMA: Patent without evidence of aneurysm, dissection, vasculitis or significant stenosis. Renals: Both renal arteries are patent without evidence of aneurysm, dissection, vasculitis, fibromuscular dysplasia or significant stenosis. IMA: Patent without evidence of aneurysm, dissection, vasculitis or significant stenosis. Inflow: Patent without evidence of aneurysm, dissection, vasculitis or significant stenosis. Proximal Outflow: Bilateral common femoral and visualized portions of the superficial and profunda femoral arteries are patent without evidence of aneurysm, dissection, vasculitis or significant stenosis. Veins: No obvious venous abnormality within the limitations of this arterial phase study. Review of the MIP images confirms the above findings. NON-VASCULAR Lower chest: The lung bases are clear. Hepatobiliary: Hepatic cirrhosis with enlarged lateral segment left lobe and nodular contour. Heterogeneous parenchymal pattern. No focal lesions identified. Portal veins are patent but there is evidence of portal hypertension with prominent periumbilical vein varices. Prominent paraesophageal and upper abdominal varices. Moderate abdominal and small pelvic ascites. Pancreas: Unremarkable. No pancreatic ductal dilatation or surrounding inflammatory changes. Spleen: Diffuse enlargement of the spleen.  No focal lesions. Adrenals/Urinary Tract: Adrenal glands are unremarkable. Kidneys are normal, without renal calculi, focal  lesion, or hydronephrosis. Bladder is unremarkable. Stomach/Bowel: The stomach, small bowel, and colon are not abnormally distended. Stool fills the colon. No wall thickening or inflammatory changes are appreciated. No intraluminal contrast extravasation or pooling to suggest a site of gastrointestinal hemorrhage. Lymphatic: Scattered abdominal and retroperitoneal lymph nodes are not pathologically enlarged, likely reactive or related to cirrhosis. Reproductive: Uterus and bilateral adnexa are unremarkable. Other: No free air in the abdomen. Abdominal wall musculature appears intact. Musculoskeletal: No acute or significant osseous findings. IMPRESSION: No evidence of abdominal aortic aneurysm or dissection. No significant arterial stenosis. 1. Hepatic cirrhosis with evidence of portal hypertension, including paraesophageal, upper abdominal, and periumbilical varices. Splenic enlargement. 2. Moderate abdominal and small pelvic ascites. 3. Cholelithiasis without evidence of cholecystitis. 4. No evidence of bowel obstruction or inflammation. 5. No evidence of intraluminal contrast extravasation or pooling to suggest a site of gastrointestinal hemorrhage. Aortic Atherosclerosis (ICD10-I70.0). Electronically Signed   By: Burman Nieves M.D.   On: 11/23/2020 20:10   ASSESSMENT AND PLAN:  Lyndel Dancel is a 40 y.o. female with medical history significant of alcoholic cirrhosis advanced who has known portal hypertension and is on transplant list program at [facility] coming in with episode of hematemesis yesterday.  Patient had recent spontaneous bacterial peritonitis in November.  She also had recent EGD few weeks ago that apparently did not show any active variceal bleed.  She came in with complaint of significant hematemesis yesterday.  #1 hematemesis:  secondary to upper GI bleed from varices.  -  Initiated Protonix drip.  -IV octreotide gtt --s/p  Transfusion -hgb 6.8--2 unit BT--8.9--7.3--6.5 -- seen  by G.I. Dr. Mia Creek plans for EGD today.  #2 symptomatic anemia:  -will transfuse to keep hemoglobin greater than seven  #3 alcoholic cirrhosis: Patient has apparently quit alcohol  use.   -Currently following with [facility] and on a wait list for liver transplant.  #4 coagulopathy and severe jaundice secondary to alcoholic cirrhosis of liver  INR is notably 2.3.    #5 hyponatremia: Most likely due to decompensated cirrhosis  #6 moderate ascites: Therapeutic paracentesis ordered by ER.  Will follow  #7 thrombocytopenia: Secondary to decompensated cirrhosis.     DVT prophylaxis: SCD Code Status: Full code Family Communication: No family at bedside Disposition Plan: Home Consults called:  Dr. Haig Prophet gastroenterology Admission status: Inpatient Procedures: EDG  Status is: Inpatient  Remains inpatient appropriate because:Inpatient level of care appropriate due to severity of illness   Dispo: The patient is from: Home              Anticipated d/c is to: Home              Anticipated d/c date is: 2 days              Patient currently is not medically stable to d/c.  Patient here with G.I. bleed. G.I. consultation for EGD.      TOTAL TIME TAKING CARE OF THIS PATIENT: 25 minutes.  >50% time spent on counselling and coordination of care  Note: This dictation was prepared with Dragon dictation along with smaller phrase technology. Any transcriptional errors that result from this process are unintentional.  Fritzi Mandes M.D    [facility]   CC: Primary care physician; Lillia Corporal, MDPatient ID: Carney Living, female   DOB: 04/30/1981, 40 y.o.   MRN: [id number]

## 2020-11-24 NOTE — Transfer of Care (Signed)
Immediate Anesthesia Transfer of Care Note  Patient: Lindsey Huang  Procedure(s) Performed: ESOPHAGOGASTRODUODENOSCOPY (EGD) WITH PROPOFOL (N/A )  Patient Location: PACU  Anesthesia Type:General  Level of Consciousness: awake and alert   Airway & Oxygen Therapy: Patient Spontanous Breathing  Post-op Assessment: Report given to RN and Post -op Vital signs reviewed and stable  Post vital signs: Reviewed and stable  Last Vitals:  Vitals Value Taken Time  BP 88/51 11/24/20 1123  Temp 36.6 C 11/24/20 1121  Pulse 96 11/24/20 1123  Resp 17 11/24/20 1123  SpO2 96 % 11/24/20 1123  Vitals shown include unvalidated device data.  Last Pain:  Vitals:   11/24/20 1121  TempSrc: Temporal  PainSc: Asleep         Complications: No complications documented.

## 2020-11-24 NOTE — Anesthesia Preprocedure Evaluation (Addendum)
Anesthesia Evaluation  Patient identified by MRN, date of birth, ID band Patient awake  General Assessment Comment:Adapted from GI consult note: history of alcohol cirrhosis who presents with two episodes of coffee ground emesis two days ago. She required blood transfusion on presentation for hemoglobin of 6.8 but responded appropriately. She denies any melena. No blood thinners, no NSAIDS. Has not drunk alcohol in over two years. Has had recent admissions for SBP. She is following with the [facility] transplant team for transplant evaluation but is not on the transplant list. Patient states she did not have nausea when the emesis occurred, just happened out of the blue. Denies any emesis.  Reviewed: Allergy & Precautions, NPO status , Patient's Chart, lab work & pertinent test results  History of Anesthesia Complications Negative for: history of anesthetic complications  Airway Mallampati: III  TM Distance: >3 FB Neck ROM: Full    Dental no notable dental hx. (+) Teeth Intact   Pulmonary neg pulmonary ROS, neg sleep apnea, neg COPD, Patient abstained from smoking.Not current smoker, former smoker,    Pulmonary exam normal breath sounds clear to auscultation       Cardiovascular Exercise Tolerance: Good METS(-) hypertension(-) CAD and (-) Past MI negative cardio ROS  (-) dysrhythmias  Rhythm:Regular Rate:Normal - Systolic murmurs    Neuro/Psych negative neurological ROS  negative psych ROS   GI/Hepatic neg GERD  ,(+) Cirrhosis   ascites  (-) substance abuse  , Multiple paracentesis in the past, last one was one month ago.  Scheduled for paracentesis later today, for "moderate ascites" on abdominal CT scan. Patient however denies feeling any abdominal tightness or distension, especially compared to previous times she has needed paracenteses.    Endo/Other  neg diabetes  Renal/GU negative Renal ROS     Musculoskeletal    Abdominal (+)  Abdomen: soft.  Soft, nontender, non distended  Peds  Hematology  (+) anemia ,   Anesthesia Other Findings Past Medical History: 2010: Blood transfusion without reported diagnosis     Comment:  misscarriage No date: Cirrhosis (HCC) No date: Hx of incompetent cervix, currently pregnant No date: Medical history non-contributory No date: Portal hypertensive gastropathy (HCC) No date: Thrombocytopenia (HCC)  Reproductive/Obstetrics                                                             Anesthesia Evaluation  Patient identified by MRN, date of birth, ID band Patient awake    Reviewed: Allergy & Precautions, H&P , NPO status , Patient's Chart, lab work & pertinent test results  History of Anesthesia Complications Negative for: history of anesthetic complications  Airway Mallampati: III  TM Distance: <3 FB Neck ROM: full    Dental  (+) Chipped, Poor Dentition   Pulmonary neg shortness of breath, Current Smoker,           Cardiovascular Exercise Tolerance: Good (-) angina(-) Past MI and (-) Peripheral Vascular Disease negative cardio ROS       Neuro/Psych negative neurological ROS  negative psych ROS   GI/Hepatic negative GI ROS, Neg liver ROS, neg GERD  ,  Endo/Other  negative endocrine ROS  Renal/GU negative Renal ROS  negative genitourinary   Musculoskeletal   Abdominal   Peds  Hematology negative hematology ROS (+)   Anesthesia Other  Findings Patient is NPO appropriate and reports no nausea or vomiting today.  Past Medical History: 2010: Blood transfusion without reported diagnosis     Comment:  misscarriage No date: Hx of incompetent cervix, currently pregnant No date: Medical history non-contributory  Past Surgical History: No date: CERVICAL CERCLAGE     Comment:  x4  BMI    Body Mass Index: 32.11 kg/m      Reproductive/Obstetrics negative OB ROS                              Anesthesia Physical Anesthesia Plan  ASA: II  Anesthesia Plan: General   Post-op Pain Management:    Induction: Intravenous  PONV Risk Score and Plan: Propofol infusion and TIVA  Airway Management Planned: Natural Airway and Nasal Cannula  Additional Equipment:   Intra-op Plan:   Post-operative Plan:   Informed Consent: I have reviewed the patients History and Physical, chart, labs and discussed the procedure including the risks, benefits and alternatives for the proposed anesthesia with the patient or authorized representative who has indicated his/her understanding and acceptance.     Dental Advisory Given  Plan Discussed with: Anesthesiologist, CRNA and Surgeon  Anesthesia Plan Comments: (Patient consented for risks of anesthesia including but not limited to:  - adverse reactions to medications - risk of intubation if required - damage to teeth, lips or other oral mucosa - sore throat or hoarseness - Damage to heart, brain, lungs or loss of life  Patient voiced understanding.)        Anesthesia Quick Evaluation  Anesthesia Physical Anesthesia Plan  ASA: III  Anesthesia Plan: General   Post-op Pain Management:    Induction: Intravenous  PONV Risk Score and Plan: 3 and Ondansetron, Propofol infusion and TIVA  Airway Management Planned: Nasal Cannula  Additional Equipment: None  Intra-op Plan:   Post-operative Plan:   Informed Consent: I have reviewed the patients History and Physical, chart, labs and discussed the procedure including the risks, benefits and alternatives for the proposed anesthesia with the patient or authorized representative who has indicated his/her understanding and acceptance.     Dental advisory given  Plan Discussed with: CRNA and Surgeon  Anesthesia Plan Comments: (Discussed risks of anesthesia with patient, including possibility of difficulty with spontaneous ventilation under anesthesia  necessitating airway intervention, PONV, and rare risks such as cardiac or respiratory or neurological events. Patient understands.)        Anesthesia Quick Evaluation

## 2020-11-24 NOTE — Procedures (Signed)
Ultrasound-guided diagnostic and therapeutic paracentesis performed yielding 350 mililiters of amber colored fluid.  Fluid was sent to lab for analysis. No immediate complications. EBL is < 1 ml.

## 2020-11-24 NOTE — Anesthesia Postprocedure Evaluation (Signed)
Anesthesia Post Note  Patient: Lindsey Huang  Procedure(s) Performed: ESOPHAGOGASTRODUODENOSCOPY (EGD) WITH PROPOFOL (N/A )  Patient location during evaluation: Endoscopy Anesthesia Type: General Level of consciousness: awake and alert Pain management: pain level controlled Vital Signs Assessment: post-procedure vital signs reviewed and stable Respiratory status: spontaneous breathing, nonlabored ventilation, respiratory function stable and patient connected to nasal cannula oxygen Cardiovascular status: blood pressure returned to baseline and stable Postop Assessment: no apparent nausea or vomiting Anesthetic complications: no   No complications documented.   Last Vitals:  Vitals:   11/24/20 1123 11/24/20 1132  BP: (!) 88/51 (!) 85/49  Pulse: 96 88  Resp: 17 14  Temp:    SpO2: 96% 95%    Last Pain:  Vitals:   11/24/20 1132  TempSrc:   PainSc: 0-No pain                 Corinda Gubler

## 2020-11-24 NOTE — Op Note (Signed)
[facility] Gastroenterology Patient Name: Lindsey Huang Procedure Date: 11/24/2020 11:01 AM MRN: [id number] Account #: [id number] Date of Birth: 1981/04/17 Admit Type: Outpatient Age: 40 Room: [facility] ENDO ROOM 3 Gender: Female Note Status: Finalized Procedure:             Upper GI endoscopy Indications:           Hematemesis Providers:             Andrey Farmer MD, MD Medicines:             Monitored Anesthesia Care Complications:         No immediate complications. Estimated blood loss:                         Minimal. Procedure:             Pre-Anesthesia Assessment:                        - Prior to the procedure, a History and Physical was                         performed, and patient medications and allergies were                         reviewed. The patient is competent. The risks and                         benefits of the procedure and the sedation options and                         risks were discussed with the patient. All questions                         were answered and informed consent was obtained.                         Patient identification and proposed procedure were                         verified by the physician, the nurse, the anesthetist                         and the technician in the endoscopy suite. Mental                         Status Examination: alert and oriented. Airway                         Examination: normal oropharyngeal airway and neck                         mobility. Respiratory Examination: clear to                         auscultation. CV Examination: normal. Prophylactic                         Antibiotics: The patient does not require prophylactic  antibiotics. Prior Anticoagulants: The patient has                         taken no previous anticoagulant or antiplatelet                         agents. ASA Grade Assessment: III - A patient with                         severe systemic  disease. After reviewing the risks and                         benefits, the patient was deemed in satisfactory                         condition to undergo the procedure. The anesthesia                         plan was to use monitored anesthesia care (MAC).                         Immediately prior to administration of medications,                         the patient was re-assessed for adequacy to receive                         sedatives. The heart rate, respiratory rate, oxygen                         saturations, blood pressure, adequacy of pulmonary                         ventilation, and response to care were monitored                         throughout the procedure. The physical status of the                         patient was re-assessed after the procedure.                        After obtaining informed consent, the endoscope was                         passed under direct vision. Throughout the procedure,                         the patient's blood pressure, pulse, and oxygen                         saturations were monitored continuously. The Endoscope                         was introduced through the mouth, and advanced to the                         second part of duodenum. The upper GI endoscopy was  accomplished without difficulty. The patient tolerated                         the procedure well. Findings:      Grade I varices were found in the lower third of the esophagus. These       completely deflated with insufflation and there were no high risk       stigmata of recent bleeding.      Severe portal hypertensive gastropathy was found in the entire examined       stomach. Oozed with mild contact of scope and also had spontanueous       spots of oozing due to insufflation.      One non-bleeding superficial gastric ulcer with no stigmata of bleeding       was found at the pylorus. The lesion was 2 mm in largest dimension.       Biopsies were  taken with a cold forceps for histology. Estimated blood       loss was minimal.      The examined duodenum was normal. Impression:            - Grade I esophageal varices. No high-risk stigmata of                         bleeding. Completely deflated with insufflation                        - Portal hypertensive gastropathy.                        - Non-bleeding gastric ulcer with no stigmata of                         bleeding. Biopsied.                        - Normal examined duodenum. Recommendation:        - Return patient to hospital ward for ongoing care.                        - Advance diet as tolerated.                        - Continue present medications.                        - Await pathology results.                        - Repeat upper endoscopy in 12 weeks to check healing.                        - Return to referring physician as previously                         scheduled. Procedure Code(s):     --- Professional ---                        [phone], Esophagogastroduodenoscopy, flexible,  transoral; with biopsy, single or multiple Diagnosis Code(s):     --- Professional ---                        I85.00, Esophageal varices without bleeding                        K76.6, Portal hypertension                        K31.89, Other diseases of stomach and duodenum                        K25.9, Gastric ulcer, unspecified as acute or chronic,                         without hemorrhage or perforation                        K92.0, Hematemesis CPT copyright 2019 [employer]. All rights reserved. The codes documented in this report are preliminary and upon coder review may  be revised to meet current compliance requirements. Andrey Farmer, MD Andrey Farmer MD, MD 11/24/2020 11:25:53 AM Number of Addenda: 0 Note Initiated On: 11/24/2020 11:01 AM Estimated Blood Loss:  Estimated blood loss was minimal.      [facility]

## 2020-11-24 NOTE — Care Plan (Signed)
EGD completed. Small gastric ulcer. Severe portal hypertensive gastropathy. Grade I varices ( completely flattens with insufflation, very subtle with no stigmata of any bleeding.  Recs-  - can discontinue octreotide - continue PPI but can change to BID PO - complete total of 7 days of antibiotics - can advance diet - diagnostic para when able - please get daily CMP and INR - monitor CBC daily - restart iron therapy - in terms of starting a beta-blocker, I'd recommend not starting at this time given decompensated status, history of SBP, and varices are grade I with no high risk stigmata of bleeding - monitor CBC's - would not start diuretics just yet but can restart lactulose/xifaxin  Will continue to follow, please call with any questions or concerns.  Merlyn Lot MD, MPH [facility] GI

## 2020-11-24 NOTE — Progress Notes (Signed)
Order received from Dr Allena Katz to discontinue octreotide and IV protonix and order protonix 40mg  po bid

## 2020-11-25 DIAGNOSIS — D689 Coagulation defect, unspecified: Secondary | ICD-10-CM

## 2020-11-25 DIAGNOSIS — K7031 Alcoholic cirrhosis of liver with ascites: Secondary | ICD-10-CM

## 2020-11-25 DIAGNOSIS — K7290 Hepatic failure, unspecified without coma: Secondary | ICD-10-CM

## 2020-11-25 DIAGNOSIS — K920 Hematemesis: Secondary | ICD-10-CM

## 2020-11-25 DIAGNOSIS — K7460 Unspecified cirrhosis of liver: Secondary | ICD-10-CM

## 2020-11-25 LAB — BPAM RBC
Blood Product Expiration Date: 202201102359
Blood Product Expiration Date: 202201312359
ISSUE DATE / TIME: 202201021730
ISSUE DATE / TIME: 202201031807
Unit Type and Rh: 600
Unit Type and Rh: 6200

## 2020-11-25 LAB — TYPE AND SCREEN
ABO/RH(D): A POS
Antibody Screen: NEGATIVE
Unit division: 0
Unit division: 0

## 2020-11-25 LAB — PROTEIN, BODY FLUID (OTHER): Total Protein, Body Fluid Other: 0.4 g/dL

## 2020-11-25 LAB — SURGICAL PATHOLOGY

## 2020-11-25 MED ORDER — CIPROFLOXACIN HCL 500 MG PO TABS: 500.0000 mg | ORAL_TABLET | Freq: Two times a day (BID) | ORAL | Status: DC

## 2020-11-25 MED ORDER — SPIRONOLACTONE 25 MG PO TABS: 100.0000 mg | ORAL_TABLET | Freq: Every day | ORAL | Status: DC

## 2020-11-25 MED ORDER — ALBUMIN HUMAN 25 % IV SOLN
100.0000 g | Freq: Once | INTRAVENOUS | Status: AC
  Administered 2020-11-25: 100 g via INTRAVENOUS
  Filled 2020-11-25: qty 400

## 2020-11-25 MED ORDER — FUROSEMIDE 40 MG PO TABS: 60.0000 mg | ORAL_TABLET | Freq: Every day | ORAL | Status: DC

## 2020-11-25 MED ORDER — SPIRONOLACTONE 25 MG PO TABS: 50.0000 mg | ORAL_TABLET | Freq: Every evening | ORAL | Status: DC

## 2020-11-25 MED ORDER — CIPROFLOXACIN HCL 500 MG PO TABS: 500.0000 mg | ORAL_TABLET | Freq: Two times a day (BID) | ORAL | 0 refills | Status: AC

## 2020-11-25 MED ORDER — LACTULOSE 10 GM/15ML PO SOLN: 10.0000 g | Freq: Two times a day (BID) | ORAL | 0 refills | Status: AC

## 2020-11-25 MED ORDER — TRAZODONE HCL 50 MG PO TABS: 25.0000 mg | ORAL_TABLET | Freq: Every evening | ORAL | Status: DC | PRN

## 2020-11-25 MED ORDER — SODIUM CHLORIDE 0.9 % IV SOLN
2.0000 g | INTRAVENOUS | Status: DC
  Administered 2020-11-25: 2 g via INTRAVENOUS
  Filled 2020-11-25: qty 20

## 2020-11-25 MED ORDER — PANTOPRAZOLE SODIUM 40 MG PO TBEC: 40.0000 mg | DELAYED_RELEASE_TABLET | Freq: Every day | ORAL | 0 refills | Status: AC

## 2020-11-25 NOTE — TOC Initial Note (Signed)
Transition of Care [facility]) - Initial/Assessment Note    Patient Details  Name: Lindsey Huang MRN: [id number] Date of Birth: 1981-10-08  Transition of Care [facility]) CM/SW Contact:    Candie Chroman, LCSW Phone Number: 11/25/2020, 8:52 AM  Clinical Narrative: Readmission prevention screen complete. CSW met with patient. No supports at bedside. CSW introduced role and explained that discharge planning would be discussed. Patient used to go to [facility] for her primary care but no longer does so. She stated she sees so many specialists that there is really no need. The main specialist she sees is Dr. Angela Adam at [facility] Patient drives herself to appointments. Pharmacy is [facility]. No issues obtaining medications. No home health or DME use prior to admission. No further concerns. CSW encouraged patient to contact CSW as needed. CSW will continue to follow patient for support and facilitate return home when stable.                 Expected Discharge Plan: Home/Self Care Barriers to Discharge: Continued Medical Work up   Patient Goals and CMS Choice        Expected Discharge Plan and Services Expected Discharge Plan: Home/Self Care     Post Acute Care Choice: NA Living arrangements for the past 2 months: Single Family Home                                      Prior Living Arrangements/Services Living arrangements for the past 2 months: Single Family Home   Patient language and need for interpreter reviewed:: Yes Do you feel safe going back to the place where you live?: Yes      Need for Family Participation in Patient Care: Yes (Comment) Care giver support system in place?: Yes (comment)   Criminal Activity/Legal Involvement Pertinent to Current Situation/Hospitalization: No - Comment as needed  Activities of Daily Living Home Assistive Devices/Equipment: None ADL Screening (condition at time of admission) Patient's cognitive ability adequate to  safely complete daily activities?: Yes Is the patient deaf or have difficulty hearing?: No Does the patient have difficulty seeing, even when wearing glasses/contacts?: No Does the patient have difficulty concentrating, remembering, or making decisions?: No Patient able to express need for assistance with ADLs?: No Does the patient have difficulty dressing or bathing?: No Independently performs ADLs?: Yes (appropriate for developmental age) Does the patient have difficulty walking or climbing stairs?: No Weakness of Legs: None Weakness of Arms/Hands: None  Permission Sought/Granted                  Emotional Assessment Appearance:: Appears stated age Attitude/Demeanor/Rapport: Engaged,Gracious Affect (typically observed): Accepting,Appropriate,Calm,Pleasant Orientation: : Oriented to Self,Oriented to Place,Oriented to  Time,Oriented to Situation Alcohol / Substance Use: Not Applicable Psych Involvement: No (comment)  Admission diagnosis:  Hypokalemia [E87.6] Hypomagnesemia [E83.42] Hyponatremia [E87.1] Liver failure ([place]) [K72.90] GI bleed [K92.2] Transaminitis [R74.01] Elevated bilirubin [R17] Low hemoglobin [D64.9] Gastrointestinal hemorrhage, unspecified gastrointestinal hemorrhage type [K92.2] Patient Active Problem List   Diagnosis Date Noted  . Thrombocytopenia ([place]) 11/23/2020  . ABLA (acute blood loss anemia) 11/23/2020  . GI bleed 11/23/2020  . Spontaneous bacterial peritonitis ([place]) 05/28/2020  . Alcoholic cirrhosis of liver with ascites ([place]) 05/27/2020  . SBP (spontaneous bacterial peritonitis) ([place]) 05/27/2020  . Hyponatremia 05/27/2020  . Hyperbilirubinemia 05/27/2020  . Normocytic anemia 05/27/2020  . Decompensated hepatic cirrhosis ([place]) 10/18/2019  . Hypokalemia 10/18/2019  .  Coagulopathy (H[facility]) 10/18/2019  . Total bilirubin, elevated 10/18/2019  . Hematemesis 10/18/2019  . Liver failure (H[facility]) 09/11/2019  . Indication for care in labor or delivery  12/25/2013  . NVD (normal vaginal delivery) 12/25/2013   PCP:  Segovia, Maria Cristina, MD Pharmacy:   [facility] - [place], [place] - [address], [address] [place] [address] Phone: [phone] Fax: [phone]  [facility] [place] - [address] [place] [address] Phone: [phone] Fax: [phone]  [id number] - [place], [place] - [address] [place] [address] Phone: [phone] Fax: [phone]     Social Determinants of Health (SDOH) Interventions    Readmission Risk Interventions Readmission Risk Prevention Plan 11/25/2020 10/08/2020  Transportation Screening Complete Complete  PCP or Specialist Appt within 3-5 Days Complete -  HRI or Home Care Consult - (No Data)  Social Work Consult for Recovery Care Planning/Counseling Complete -  Palliative Care Screening Not Applicable Not Applicable  Medication Review (RN Care Manager) Complete Complete  Some recent data might be hidden    

## 2020-11-25 NOTE — Plan of Care (Signed)
Discharge order received. Patient mental status is at baseline. Vital signs stable . No signs of acute distress. Discharge instructions given. Patient verbalized understanding. No other issues noted at this time.   

## 2020-11-25 NOTE — TOC Transition Note (Signed)
Transition of Care [facility]) - CM/SW Discharge Note   Patient Details  Name: Lindsey Huang MRN: [id number] Date of Birth: 1980-12-12  Transition of Care [facility]) CM/SW Contact:  Margarito Liner, LCSW Phone Number: 11/25/2020, 1:46 PM   Clinical Narrative:  Patient has orders to discharge home today. No further concerns. CSW signing off.   Final next level of care: Home/Self Care Barriers to Discharge: Barriers Resolved   Patient Goals and CMS Choice        Discharge Placement                    Patient and family notified of of transfer: 11/25/20  Discharge Plan and Services     Post Acute Care Choice: NA                               Social Determinants of Health (SDOH) Interventions     Readmission Risk Interventions Readmission Risk Prevention Plan 11/25/2020 10/08/2020  Transportation Screening Complete Complete  PCP or Specialist Appt within 3-5 Days Complete -  HRI or Home Care Consult - (No Data)  Social Work Consult for Recovery Care Planning/Counseling Complete -  Palliative Care Screening Not Applicable Not Applicable  Medication Review Oceanographer) Complete Complete  Some recent data might be hidden

## 2020-11-25 NOTE — Discharge Summary (Signed)
[facility] -  at [facility]   PATIENT NAME: Lindsey Huang    MR#:  [id number]  DATE OF BIRTH:  03/11/1981  DATE OF ADMISSION:  11/23/2020 ADMITTING PHYSICIAN: Rometta EmeryMohammad L Garba, MD  DATE OF DISCHARGE: 11/25/2020  PRIMARY CARE PHYSICIAN: Joya MartyrSegovia, Maria Cristina, MD    ADMISSION DIAGNOSIS:  Hypokalemia [E87.6] Hypomagnesemia [E83.42] Hyponatremia [E87.1] Liver failure (HCC) [K72.90] GI bleed [K92.2] Transaminitis [R74.01] Elevated bilirubin [R17] Low hemoglobin [D64.9] Gastrointestinal hemorrhage, unspecified gastrointestinal hemorrhage type [K92.2]  DISCHARGE DIAGNOSIS:  GI bleed due to PUD/Portal HTN  SECONDARY DIAGNOSIS:   Past Medical History:  Diagnosis Date  . Blood transfusion without reported diagnosis 2010   misscarriage  . Cirrhosis (HCC)   . Hx of incompetent cervix, currently pregnant   . Medical history non-contributory   . Portal hypertensive gastropathy (HCC)   . Thrombocytopenia [facility](HCC)     HOSPITAL COURSE:   Tresa EndoKelly Hardinis a 40 y.o.femalewith medical history significant ofalcoholic cirrhosis advanced who has known portal hypertension and [facility] coming in with episode of hematemesis yesterday. Patient had recent spontaneous bacterial peritonitis in November. She also had recent EGD few weeks ago that apparently did not show any active variceal bleed. She came in with complaint of significant hematemesis yesterday.  #1 hematemesis: secondary to upper GI bleed from varices.  - Initiated Protonix drip. --change to po -IV octreotide gtt--d/c --s/p Transfusion -hgb 6.8--2 unit BT--8.9--7.3--6.5--1 unit -- 7.6 -- seen by G.I. Dr. Mia CreekLocklear EGD showed - Grade I esophageal varices. No high-risk stigmata of                         bleeding. Completely deflated with insufflation                        - Portal hypertensive gastropathy.                        - Non-bleeding gastric ulcer  with no stigmata of                         bleeding. Biopsied.                        - Normal examined duodenum. -no hemetemesis. OK from GI standpoint for d/c  #2 symptomatic anemia: -will transfuse to keep hemoglobin greater than seven hgb 7.3  #3 alcoholic cirrhosis:Patient has apparently quit alcohol use.  -Currently following with [facility] and on a wait list for liver transplant. -resume lasix, spirnolactone, lactulose and rifaximin  #4 coagulopathy and severe jaundice secondary to alcoholic cirrhosis of liver INR is notably 2.3.   #5hyponatremia:Most likely due to decompensated cirrhosis  #6 moderate ascites:Therapeutic paracentesis completed 350 cc removed -Treating fro SBP with cipro 500 mg bidfor 7 days--250 mg qd for SBP -albumin to be given as per GI request  #7 thrombocytopenia:Secondary to decompensated cirrhosis.    DVT prophylaxis:SCD Code Status:Full code Family Communication:No family at bedside Disposition Plan:Home Consults called:Dr. Lockleargastroenterology Admission status:Inpatient Procedures: EDG  Status is: Inpatient   Dispo: The patient is from: Home  Anticipated d/c is to: Home  Anticipated d/c date is: today  Patient currently is medically ok to d/c.    CONSULTS OBTAINED:    DRUG ALLERGIES:  No Known Allergies  DISCHARGE MEDICATIONS:   Allergies as of 11/25/2020  No Known Allergies     Medication List    STOP taking these medications   citalopram 10 MG tablet Commonly known as: CELEXA     TAKE these medications   ciprofloxacin 500 MG tablet Commonly known as: CIPRO Take 1 tablet (500 mg total) by mouth 2 (two) times daily for 3 days. And from Saturday start 250 mg po qd Start taking on: November 26, 2020   ferrous sulfate 325 (65 FE) MG tablet Take 1 tablet (325 mg total) by mouth daily with breakfast.   furosemide 20 MG tablet Commonly known  as: LASIX Take 3 tablets (60 mg total) by mouth daily.   lactulose 10 GM/15ML solution Commonly known as: CHRONULAC Take 15 mLs (10 g total) by mouth 2 (two) times daily. What changed:  when to take this reasons to take this   multivitamin with minerals Tabs tablet Take 1 tablet by mouth daily.   oxyCODONE 5 MG immediate release tablet Commonly known as: Roxicodone Take 1-2 tablets (5-10 mg total) by mouth every 4 (four) hours as needed. 1 tablet for moderate pain, 2 tablets for severe pain   pantoprazole 40 MG tablet Commonly known as: PROTONIX Take 1 tablet (40 mg total) by mouth daily. What changed:  medication strength how much to take   potassium chloride 10 MEQ tablet Commonly known as: KLOR-CON Take 1 tablet (10 mEq total) by mouth daily.   rifaximin 550 MG Tabs tablet Commonly known as: XIFAXAN Take 1 tablet (550 mg total) by mouth 2 (two) times daily.   spironolactone 50 MG tablet Commonly known as: ALDACTONE Take 3 tablets (150 mg total) by mouth daily. What changed: additional instructions   thiamine 50 MG tablet Commonly known as: VITAMIN B-1 Take 50 mg by mouth daily.       If you experience worsening of your admission symptoms, develop shortness of breath, life threatening emergency, suicidal or homicidal thoughts you must seek medical attention immediately by calling 911 or calling your MD immediately  if symptoms less severe.  You Must read complete instructions/literature along with all the possible adverse reactions/side effects for all the Medicines you take and that have been prescribed to you. Take any new Medicines after you have completely understood and accept all the possible adverse reactions/side effects.   Please note  You were cared for by a hospitalist during your hospital stay. If you have any questions about your discharge medications or the care you received while you were in the hospital after you are discharged, you can call the  unit and asked to speak with the hospitalist on call if the hospitalist that took care of you is not available. Once you are discharged, your primary care physician will handle any further medical issues. Please note that NO REFILLS for any discharge medications will be authorized once you are discharged, as it is imperative that you return to your primary care physician (or establish a relationship with a primary care physician if you do not have one) for your aftercare needs so that they can reassess your need for medications and monitor your lab values. Today   SUBJECTIVE  Tolerating po diet   VITAL SIGNS:  Blood pressure (!) 90/58, pulse 74, temperature 98.4 F (36.9 C), temperature source Oral, resp. rate 16, height 4\' 11"  (1.499 m), weight 61.7 kg, SpO2 100 %, unknown if currently breastfeeding.  I/O:    Intake/Output Summary (Last 24 hours) at 11/25/2020 1345 Last data filed at 11/25/2020 0900 Gross per  24 hour  Intake 1533.32 ml  Output --  Net 1533.32 ml    PHYSICAL EXAMINATION:  GENERAL:  40 y.o.-year-old patient lying in the bed with no acute distress. Jaundiced skin EYES: Pupils equal, round, reactive to light and accommodation. ++ scleral icterus.  HEENT: Head atraumatic, normocephalic. Oropharynx and nasopharynx clear.  LUNGS: Normal breath sounds bilaterally, no wheezing, rales,rhonchi or crepitation. No use of accessory muscles of respiration.  CARDIOVASCULAR: S1, S2 normal. No murmurs, rubs, or gallops.  ABDOMEN: Soft, non-tender, non-distended. Bowel sounds present. No organomegaly or mass.  EXTREMITIES: No pedal edema, cyanosis, or clubbing.  NEUROLOGIC: Cranial nerves II through XII are intact. Muscle strength 5/5 in all extremities. Sensation intact. Gait not checked.  PSYCHIATRIC: The patient is alert and oriented x 3.  SKIN: jaundice  DATA REVIEW:   CBC  Recent Labs  Lab 11/24/20 1740  WBC 7.5  HGB 7.6*  HCT 22.1*  PLT 68*    Chemistries  Recent Labs   Lab 11/23/20 1438 11/24/20 0538  NA 122* 128*  K 3.6 3.8  CL 87* 97*  CO2 25 23  GLUCOSE 141* 86  BUN 40* 31*  CREATININE 0.55 0.42*  CALCIUM 8.6* 8.3*  MG 1.6*  --   AST 97* 102*  ALT 51* 44  ALKPHOS 90 70  BILITOT 11.8* 11.1*    Microbiology Results   Recent Results (from the past 240 hour(s))  Resp Panel by RT-PCR (Flu A&B, Covid) Nasopharyngeal Swab     Status: None   Collection Time: 11/23/20  5:47 PM   Specimen: Nasopharyngeal Swab; Nasopharyngeal(NP) swabs in vial transport medium  Result Value Ref Range Status   SARS Coronavirus 2 by RT PCR NEGATIVE NEGATIVE Final    Comment: (NOTE) SARS-CoV-2 target nucleic acids are NOT DETECTED.  The SARS-CoV-2 RNA is generally detectable in upper respiratory specimens during the acute phase of infection. The lowest concentration of SARS-CoV-2 viral copies this assay can detect is 138 copies/mL. A negative result does not preclude SARS-Cov-2 infection and should not be used as the sole basis for treatment or other patient management decisions. A negative result may occur with  improper specimen collection/handling, submission of specimen other than nasopharyngeal swab, presence of viral mutation(s) within the areas targeted by this assay, and inadequate number of viral copies(<138 copies/mL). A negative result must be combined with clinical observations, patient history, and epidemiological information. The expected result is Negative.  Fact Sheet for Patients:  BloggerCourse.com  Fact Sheet for Healthcare Providers:  SeriousBroker.it  This test is no t yet approved or cleared by the [place] FDA and  has been authorized for detection and/or diagnosis of SARS-CoV-2 by FDA under an Emergency Use Authorization (EUA). This EUA will remain  in effect (meaning this test can be used) for the duration of the COVID-19 declaration under Section 564(b)(1) of the Act,  21 U.S.C.section 360bbb-3(b)(1), unless the authorization is terminated  or revoked sooner.       Influenza A by PCR NEGATIVE NEGATIVE Final   Influenza B by PCR NEGATIVE NEGATIVE Final    Comment: (NOTE) The Xpert Xpress SARS-CoV-2/FLU/RSV plus assay is intended as an aid in the diagnosis of influenza from Nasopharyngeal swab specimens and should not be used as a sole basis for treatment. Nasal washings and aspirates are unacceptable for Xpert Xpress SARS-CoV-2/FLU/RSV testing.  Fact Sheet for Patients: BloggerCourse.com  Fact Sheet for Healthcare Providers: SeriousBroker.it  This test is not yet approved or cleared by the [place] FDA  and has been authorized for detection and/or diagnosis of SARS-CoV-2 by FDA under an Emergency Use Authorization (EUA). This EUA will remain in effect (meaning this test can be used) for the duration of the COVID-19 declaration under Section 564(b)(1) of the Act, 21 U.S.C. section 360bbb-3(b)(1), unless the authorization is terminated or revoked.  Performed at [facility], [address] [place], [place] [address]   Blood culture (routine x 2)     Status: None (Preliminary result)   Collection Time: 11/23/20  5:47 PM   Specimen: BLOOD  Result Value Ref Range Status   Specimen Description BLOOD LEFT ANTECUBITAL  Final   Special Requests   Final    BOTTLES DRAWN AEROBIC AND ANAEROBIC Blood Culture results may not be optimal due to an inadequate volume of blood received in culture bottles   Culture   Final    NO GROWTH 2 DAYS Performed at [facility], [address] [place], [place] [address]    Report Status PENDING  Incomplete  Blood culture (routine x 2)     Status: None (Preliminary result)   Collection Time: 11/23/20  5:47 PM   Specimen: BLOOD  Result Value Ref Range Status   Specimen Description BLOOD BLOOD RIGHT HAND  Final   Special Requests   Final     BOTTLES DRAWN AEROBIC AND ANAEROBIC Blood Culture results may not be optimal due to an inadequate volume of blood received in culture bottles   Culture   Final    NO GROWTH 2 DAYS Performed at [facility], [address] [place], [place] [address]    Report Status PENDING  Incomplete  Body fluid culture     Status: None (Preliminary result)   Collection Time: 11/24/20  3:15 PM   Specimen: PATH Cytology Peritoneal fluid  Result Value Ref Range Status   Specimen Description   Final    PERITONEAL Performed at [facility], [address], [place], [place] [address]    Special Requests   Final    NONE Performed at [facility], [address], [place], [place] [address]    Gram Stain PENDING  Incomplete   Culture   Final    NO GROWTH < 12 HOURS Performed at [facility], [address] [place], [place] [address]    Report Status PENDING  Incomplete  Aerobic/Anaerobic Culture (surgical/deep wound)     Status: None (Preliminary result)   Collection Time: 11/24/20  3:15 PM   Specimen: PATH Cytology Peritoneal fluid  Result Value Ref Range Status   Specimen Description   Final    PERITONEAL Performed at [facility], [address], [place], [place] [address]    Special Requests   Final    NONE Performed at [facility], [address] [place], [place] [address]    Gram Stain   Final    NO WBC SEEN NO ORGANISMS SEEN Performed at [facility], [address] [place], [place] [address]    Culture PENDING  Incomplete   Report Status PENDING  Incomplete    RADIOLOGY:  DG Chest 2 View  Result Date: 11/23/2020 CLINICAL DATA:  Hemoptysis EXAM: CHEST - 2 VIEW COMPARISON:  05/26/2020 FINDINGS: The heart size and mediastinal contours are within normal limits. Both lungs are clear. The visualized skeletal structures are unremarkable. IMPRESSION: No active cardiopulmonary disease. Electronically Signed   By: Jasmine Pang  M.D.   On: 11/23/2020 17:22  US Paracentesis  Result Date: 11/24/2020 INDICATION: Patient with history of alcoholic cirrhosis with recurrent ascites. Request is for therapeutic and diagnostic paracentesis EXAM: ULTRASOUND GUIDED THERAPEUTIC AND DIAGNOSTIC PARACENTESIS MEDICATIONS: Lidocaine 1% 15 mL COMPLICATIONS: None immediate. PROCEDURE: Informed written consent was obtained from the patient after a discussion of the risks, benefits and alternatives to treatment. A timeout was performed prior to the initiation of the procedure. Initial ultrasound scanning demonstrates a small amount of ascites within the right lower abdominal quadrant. The right lower abdomen was prepped and draped in the usual sterile fashion. 1% lidocaine was used for local anesthesia. Following this, a 19 gauge, 10-cm, Yueh catheter was introduced. An ultrasound image was saved for documentation purposes. The paracentesis was performed. The catheter was removed and Dermabond was applied. The patient tolerated the procedure well without immediate post procedural complication. FINDINGS: A total of approximately 350 mL of amber colored fluid was removed. Samples were sent to the laboratory as requested by the clinical team. IMPRESSION: Successful ultrasound-guided therapeutic paracentesis yielding 350 mL of peritoneal fluid. Read by: Anders Grant, NP Electronically Signed   By: Richarda Overlie M.D.   On: 11/24/2020 15:31   CT Angio Abd/Pel W and/or Wo Contrast  Result Date: 11/23/2020 CLINICAL DATA:  Lethargy and weakness for 2 days. Two episodes of bloody emesis. GI bleed. EXAM: CTA ABDOMEN AND PELVIS WITHOUT AND WITH CONTRAST TECHNIQUE: Multidetector CT imaging of the abdomen and pelvis was performed using the standard protocol during bolus administration of intravenous contrast. Multiplanar reconstructed images and MIPs were obtained and reviewed to evaluate the vascular anatomy. CONTRAST:  OMNIPAQUE IOHEXOL 350 MG/ML SOLN  COMPARISON:  05/27/2020 FINDINGS: VASCULAR Aorta: Unenhanced images demonstrate scattered aortic calcifications. Cholelithiasis. Images obtained during arterial phase after contrast administration demonstrate normal caliber abdominal aorta. No aneurysm or dissection. No significant stenosis. Celiac: Patent without evidence of aneurysm, dissection, vasculitis or significant stenosis. SMA: Patent without evidence of aneurysm, dissection, vasculitis or significant stenosis. Renals: Both renal arteries are patent without evidence of aneurysm, dissection, vasculitis, fibromuscular dysplasia or significant stenosis. IMA: Patent without evidence of aneurysm, dissection, vasculitis or significant stenosis. Inflow: Patent without evidence of aneurysm, dissection, vasculitis or significant stenosis. Proximal Outflow: Bilateral common femoral and visualized portions of the superficial and profunda femoral arteries are patent without evidence of aneurysm, dissection, vasculitis or significant stenosis. Veins: No obvious venous abnormality within the limitations of this arterial phase study. Review of the MIP images confirms the above findings. NON-VASCULAR Lower chest: The lung bases are clear. Hepatobiliary: Hepatic cirrhosis with enlarged lateral segment left lobe and nodular contour. Heterogeneous parenchymal pattern. No focal lesions identified. Portal veins are patent but there is evidence of portal hypertension with prominent periumbilical vein varices. Prominent paraesophageal and upper abdominal varices. Moderate abdominal and small pelvic ascites. Pancreas: Unremarkable. No pancreatic ductal dilatation or surrounding inflammatory changes. Spleen: Diffuse enlargement of the spleen.  No focal lesions. Adrenals/Urinary Tract: Adrenal glands are unremarkable. Kidneys are normal, without renal calculi, focal lesion, or hydronephrosis. Bladder is unremarkable. Stomach/Bowel: The stomach, small bowel, and colon are not  abnormally distended. Stool fills the colon. No wall thickening or inflammatory changes are appreciated. No intraluminal contrast extravasation or pooling to suggest a site of gastrointestinal hemorrhage. Lymphatic: Scattered abdominal and retroperitoneal lymph nodes are not pathologically enlarged, likely reactive or related to cirrhosis. Reproductive: Uterus and bilateral adnexa are unremarkable. Other: No free air in the abdomen. Abdominal wall musculature appears intact. Musculoskeletal: No acute or significant osseous findings. IMPRESSION: No  evidence of abdominal aortic aneurysm or dissection. No significant arterial stenosis. 1. Hepatic cirrhosis with evidence of portal hypertension, including paraesophageal, upper abdominal, and periumbilical varices. Splenic enlargement. 2. Moderate abdominal and small pelvic ascites. 3. Cholelithiasis without evidence of cholecystitis. 4. No evidence of bowel obstruction or inflammation. 5. No evidence of intraluminal contrast extravasation or pooling to suggest a site of gastrointestinal hemorrhage. Aortic Atherosclerosis (ICD10-I70.0). Electronically Signed   By: Burman Nieves M.D.   On: 11/23/2020 20:10     CODE STATUS:     Code Status Orders  (From admission, onward)         Start     Ordered   11/23/20 2331  Full code  Continuous        11/23/20 2330        Code Status History    Date Active Date Inactive Code Status Order ID Comments User Context   10/06/2020 1508 10/09/2020 2347 Full Code [id number]  Lucile Shutters, MD ED   05/27/2020 1757 06/01/2020 1743 Full Code [id number]  Osvaldo Shipper, MD ED   10/18/2019 0828 10/19/2019 2021 Full Code [id number]  Dimple Nanas, MD ED   09/11/2019 1645 09/12/2019 0716 Full Code [id number]  Ihor Austin, MD Inpatient   Advance Care Planning Activity       TOTAL TIME TAKING CARE OF THIS PATIENT: *35* minutes.    Enedina Finner M.D  [facility]    CC: Primary care physician; Waynard Reeds  Suella Grove, MD

## 2020-11-26 ENCOUNTER — Encounter: Payer: Self-pay | Admitting: Gastroenterology

## 2020-11-28 LAB — CULTURE, BLOOD (ROUTINE X 2)
Culture: NO GROWTH
Culture: NO GROWTH

## 2020-11-28 LAB — BODY FLUID CULTURE
Culture: NO GROWTH
Gram Stain: NONE SEEN

## 2020-11-30 LAB — AEROBIC/ANAEROBIC CULTURE W GRAM STAIN (SURGICAL/DEEP WOUND)
Culture: NO GROWTH
Gram Stain: NONE SEEN

## 2020-12-03 LAB — LIPASE, FLUID: Lipase-Fluid: 18 U/L

## 2020-12-04 ENCOUNTER — Ambulatory Visit
Admission: RE | Admit: 2020-12-04 | Discharge: 2020-12-04 | Disposition: A | Discharge status: Home / Self Care | Payer: Medicaid Other | Source: Ambulatory Visit | Attending: Gastroenterology | Admitting: Gastroenterology

## 2020-12-04 ENCOUNTER — Other Ambulatory Visit: Payer: Self-pay | Admitting: Gastroenterology

## 2020-12-04 ENCOUNTER — Other Ambulatory Visit: Payer: Self-pay

## 2020-12-04 DIAGNOSIS — K7469 Other cirrhosis of liver: Secondary | ICD-10-CM

## 2020-12-04 MED ORDER — ALBUMIN HUMAN 25 % IV SOLN: 25.0000 g | Freq: Once | INTRAVENOUS | Status: AC

## 2020-12-04 MED ORDER — ALBUMIN HUMAN 25 % IV SOLN
25.0000 g | Freq: Once | INTRAVENOUS | Status: AC
  Administered 2020-12-04: 25 g via INTRAVENOUS

## 2020-12-04 MED ORDER — ALBUMIN HUMAN 25 % IV SOLN
INTRAVENOUS | Status: AC
  Administered 2020-12-04: 25 g via INTRAVENOUS
  Filled 2020-12-04: qty 200

## 2020-12-04 NOTE — Procedures (Signed)
PROCEDURE SUMMARY:  Successful US guided paracentesis from left abdomen.  Yielded 3150 ml of clear yellow fluid.  No immediate complications.  Pt tolerated well.   EBL < 1 mL  Mickie Kay, NP 12/04/2020 4:10 PM

## 2020-12-10 ENCOUNTER — Other Ambulatory Visit: Payer: Self-pay | Admitting: Gastroenterology

## 2020-12-10 DIAGNOSIS — K7031 Alcoholic cirrhosis of liver with ascites: Secondary | ICD-10-CM

## 2020-12-11 ENCOUNTER — Other Ambulatory Visit: Payer: Self-pay | Admitting: Gastroenterology

## 2020-12-11 ENCOUNTER — Ambulatory Visit
Admission: RE | Admit: 2020-12-11 | Discharge: 2020-12-11 | Disposition: A | Discharge status: Home / Self Care | Payer: Medicaid Other | Source: Ambulatory Visit | Attending: Gastroenterology | Admitting: Gastroenterology

## 2020-12-11 ENCOUNTER — Other Ambulatory Visit: Payer: Self-pay

## 2020-12-11 DIAGNOSIS — K7031 Alcoholic cirrhosis of liver with ascites: Secondary | ICD-10-CM

## 2020-12-11 MED ORDER — ALBUMIN HUMAN 25 % IV SOLN: 25.0000 g | Freq: Once | INTRAVENOUS | Status: DC

## 2020-12-11 MED ORDER — ALBUMIN HUMAN 25 % IV SOLN
INTRAVENOUS | Status: AC
  Filled 2020-12-11: qty 100

## 2020-12-18 ENCOUNTER — Ambulatory Visit
Admission: RE | Admit: 2020-12-18 | Discharge: 2020-12-18 | Disposition: A | Discharge status: Home / Self Care | Payer: Medicaid Other | Source: Ambulatory Visit | Attending: Gastroenterology | Admitting: Gastroenterology

## 2020-12-18 ENCOUNTER — Other Ambulatory Visit: Payer: Self-pay

## 2020-12-18 DIAGNOSIS — K7031 Alcoholic cirrhosis of liver with ascites: Secondary | ICD-10-CM | POA: Diagnosis not present

## 2020-12-18 MED ORDER — ALBUMIN HUMAN 25 % IV SOLN
INTRAVENOUS | Status: AC
  Administered 2020-12-18: 25 g via INTRAVENOUS
  Filled 2020-12-18: qty 200

## 2020-12-18 MED ORDER — ALBUMIN HUMAN 25 % IV SOLN: 25.0000 g | Freq: Once | INTRAVENOUS | Status: AC

## 2020-12-18 MED ORDER — ALBUMIN HUMAN 25 % IV SOLN
25.0000 g | Freq: Once | INTRAVENOUS | Status: AC
  Administered 2020-12-18: 25 g via INTRAVENOUS

## 2020-12-18 NOTE — Procedures (Signed)
Interventional Radiology Procedure:   Indications: Alcoholic cirrhosis of liver with ascites  Procedure: US guided paracentesis  Findings: Removed 3750 ml from right lower quadrant  Complications: None     EBL: Less than 10 ml   Lindsey Huang R. [name], MD  Pager: [phone]

## 2020-12-19 ENCOUNTER — Ambulatory Visit: Payer: No Typology Code available for payment source

## 2020-12-23 ENCOUNTER — Other Ambulatory Visit: Payer: Self-pay | Admitting: Gastroenterology

## 2020-12-23 DIAGNOSIS — K7031 Alcoholic cirrhosis of liver with ascites: Secondary | ICD-10-CM

## 2020-12-25 ENCOUNTER — Ambulatory Visit
Admission: RE | Admit: 2020-12-25 | Discharge: 2020-12-25 | Disposition: A | Discharge status: Home / Self Care | Payer: Medicaid Other | Source: Ambulatory Visit | Attending: Gastroenterology | Admitting: Gastroenterology

## 2020-12-25 ENCOUNTER — Other Ambulatory Visit: Payer: Self-pay

## 2020-12-25 DIAGNOSIS — K7031 Alcoholic cirrhosis of liver with ascites: Secondary | ICD-10-CM | POA: Diagnosis not present

## 2020-12-25 MED ORDER — ALBUMIN HUMAN 25 % IV SOLN: 50.0000 g | Freq: Once | INTRAVENOUS | Status: AC

## 2020-12-25 MED ORDER — ALBUMIN HUMAN 25 % IV SOLN
INTRAVENOUS | Status: AC
  Administered 2020-12-25: 50 g via INTRAVENOUS
  Filled 2020-12-25: qty 200

## 2020-12-25 NOTE — Procedures (Signed)
Interventional Radiology Procedure Note  Procedure: US guided paracentesis  Access: Left lower quadrant.  Complications: None  Estimated Blood Loss: None  Acquanetta Belling, MD [phone]

## 2020-12-25 NOTE — Discharge Instructions (Signed)
Paracentesis, Care After This sheet gives you information about how to care for yourself after your procedure. Your health care provider may also give you more specific instructions. If you have problems or questions, contact your health care provider. What can I expect after the procedure? After the procedure, it is common to have a small amount of clear fluid coming from the puncture site. Follow these instructions at home: Puncture site care  Follow instructions from your health care provider about how to take care of your puncture site. Make sure you: ? Wash your hands with soap and water before and after you change your bandage (dressing). If soap and water are not available, use hand sanitizer. ? Change your dressing as told by your health care provider.  Check your puncture area every day for signs of infection. Check for: ? Redness, swelling, or pain. ? More fluid or blood. ? Warmth. ? Pus or a bad smell.   General instructions  Return to your normal activities as told by your health care provider. Ask your health care provider what activities are safe for you.  Take over-the-counter and prescription medicines only as told by your health care provider.  Do not take baths, swim, or use a hot tub until your health care provider approves. Ask your health care provider if you may take showers. You may only be allowed to take sponge baths.  Keep all follow-up visits as told by your health care provider. This is important. Contact a health care provider if:  You have redness, swelling, or pain at your puncture site.  You have more fluid or blood coming from your puncture site.  Your puncture site feels warm to the touch.  You have pus or a bad smell coming from your puncture site.  You have a fever. Get help right away if:  You have chest pain or shortness of breath.  You develop increasing pain, discomfort, or swelling in your abdomen.  You feel dizzy or light-headed or  you faint. Summary  After the procedure, it is common to have a small amount of clear fluid coming from the puncture site.  Follow instructions from your health care provider about how to take care of your puncture site.  Check your puncture area every day signs of infection.  Keep all follow-up visits as told by your health care provider. This information is not intended to replace advice given to you by your health care provider. Make sure you discuss any questions you have with your health care provider. Document Revised: 05/22/2019 Document Reviewed: 08/29/2018 Elsevier Patient Education  2021 [employer]  

## 2020-12-26 ENCOUNTER — Ambulatory Visit: Payer: No Typology Code available for payment source

## 2021-01-02 ENCOUNTER — Emergency Department: Payer: Medicaid Other

## 2021-01-02 ENCOUNTER — Encounter: Payer: Self-pay | Admitting: Family Medicine

## 2021-01-02 ENCOUNTER — Inpatient Hospital Stay
Admission: EM | Admit: 2021-01-02 | Discharge: 2021-01-16 | DRG: 372 | Disposition: A | Discharge status: Other Acute Inpatient Hospital | Payer: Medicaid Other | Attending: Family Medicine | Admitting: Family Medicine

## 2021-01-02 ENCOUNTER — Other Ambulatory Visit: Payer: Self-pay

## 2021-01-02 ENCOUNTER — Inpatient Hospital Stay
Admission: RE | Admit: 2021-01-02 | Discharge: 2021-01-02 | Disposition: A | Discharge status: Home / Self Care | Payer: Medicaid Other | Source: Ambulatory Visit | Attending: Gastroenterology | Admitting: Gastroenterology

## 2021-01-02 DIAGNOSIS — K219 Gastro-esophageal reflux disease without esophagitis: Secondary | ICD-10-CM | POA: Diagnosis present

## 2021-01-02 DIAGNOSIS — K7290 Hepatic failure, unspecified without coma: Secondary | ICD-10-CM | POA: Diagnosis not present

## 2021-01-02 DIAGNOSIS — I959 Hypotension, unspecified: Secondary | ICD-10-CM | POA: Diagnosis present

## 2021-01-02 DIAGNOSIS — R7989 Other specified abnormal findings of blood chemistry: Secondary | ICD-10-CM

## 2021-01-02 DIAGNOSIS — K7031 Alcoholic cirrhosis of liver with ascites: Secondary | ICD-10-CM | POA: Insufficient documentation

## 2021-01-02 DIAGNOSIS — K766 Portal hypertension: Secondary | ICD-10-CM | POA: Diagnosis present

## 2021-01-02 DIAGNOSIS — D684 Acquired coagulation factor deficiency: Secondary | ICD-10-CM | POA: Diagnosis present

## 2021-01-02 DIAGNOSIS — E871 Hypo-osmolality and hyponatremia: Secondary | ICD-10-CM | POA: Diagnosis not present

## 2021-01-02 DIAGNOSIS — K3189 Other diseases of stomach and duodenum: Secondary | ICD-10-CM | POA: Diagnosis present

## 2021-01-02 DIAGNOSIS — E878 Other disorders of electrolyte and fluid balance, not elsewhere classified: Secondary | ICD-10-CM | POA: Diagnosis present

## 2021-01-02 DIAGNOSIS — K7040 Alcoholic hepatic failure without coma: Secondary | ICD-10-CM | POA: Diagnosis present

## 2021-01-02 DIAGNOSIS — Z20822 Contact with and (suspected) exposure to covid-19: Secondary | ICD-10-CM | POA: Diagnosis present

## 2021-01-02 DIAGNOSIS — D638 Anemia in other chronic diseases classified elsewhere: Secondary | ICD-10-CM | POA: Diagnosis present

## 2021-01-02 DIAGNOSIS — R188 Other ascites: Secondary | ICD-10-CM | POA: Diagnosis not present

## 2021-01-02 DIAGNOSIS — Z818 Family history of other mental and behavioral disorders: Secondary | ICD-10-CM | POA: Diagnosis not present

## 2021-01-02 DIAGNOSIS — Z79899 Other long term (current) drug therapy: Secondary | ICD-10-CM

## 2021-01-02 DIAGNOSIS — K7460 Unspecified cirrhosis of liver: Secondary | ICD-10-CM | POA: Diagnosis not present

## 2021-01-02 DIAGNOSIS — K652 Spontaneous bacterial peritonitis: Secondary | ICD-10-CM | POA: Diagnosis not present

## 2021-01-02 DIAGNOSIS — Z7682 Awaiting organ transplant status: Secondary | ICD-10-CM

## 2021-01-02 DIAGNOSIS — Z87891 Personal history of nicotine dependence: Secondary | ICD-10-CM | POA: Diagnosis not present

## 2021-01-02 DIAGNOSIS — K7210 Chronic hepatic failure without coma: Secondary | ICD-10-CM | POA: Diagnosis present

## 2021-01-02 DIAGNOSIS — D6959 Other secondary thrombocytopenia: Secondary | ICD-10-CM | POA: Diagnosis present

## 2021-01-02 DIAGNOSIS — K5900 Constipation, unspecified: Secondary | ICD-10-CM | POA: Diagnosis not present

## 2021-01-02 DIAGNOSIS — E875 Hyperkalemia: Secondary | ICD-10-CM | POA: Diagnosis present

## 2021-01-02 DIAGNOSIS — K7200 Acute and subacute hepatic failure without coma: Secondary | ICD-10-CM | POA: Diagnosis not present

## 2021-01-02 DIAGNOSIS — R140 Abdominal distension (gaseous): Secondary | ICD-10-CM

## 2021-01-02 LAB — COMPREHENSIVE METABOLIC PANEL
ALT: 34 U/L (ref 0–44)
AST: 69 U/L — ABNORMAL HIGH (ref 15–41)
Albumin: 4 g/dL (ref 3.5–5.0)
Alkaline Phosphatase: 155 U/L — ABNORMAL HIGH (ref 38–126)
Anion gap: 9 (ref 5–15)
BUN: 17 mg/dL (ref 6–20)
CO2: 22 mmol/L (ref 22–32)
Calcium: 8.9 mg/dL (ref 8.9–10.3)
Chloride: 94 mmol/L — ABNORMAL LOW (ref 98–111)
Creatinine, Ser: 0.34 mg/dL — ABNORMAL LOW (ref 0.44–1.00)
GFR, Estimated: >60 mL/min (ref >60)
Glucose, Bld: 149 mg/dL — ABNORMAL HIGH (ref 70–99)
Potassium: 3.9 mmol/L (ref 3.5–5.1)
Sodium: 125 mmol/L — ABNORMAL LOW (ref 135–145)
Total Bilirubin: 13.1 mg/dL — ABNORMAL HIGH (ref 0.3–1.2)
Total Protein: 6.6 g/dL (ref 6.5–8.1)

## 2021-01-02 LAB — CBC WITH DIFFERENTIAL/PLATELET
Abs Immature Granulocytes: 0.84 10*3/uL — ABNORMAL HIGH (ref 0.00–0.07)
Basophils Absolute: 0.1 10*3/uL (ref 0.0–0.1)
Basophils Relative: 1 %
Eosinophils Absolute: 0.3 10*3/uL (ref 0.0–0.5)
Eosinophils Relative: 2 %
HCT: 22.1 % — ABNORMAL LOW (ref 36.0–46.0)
Hemoglobin: 7.7 g/dL — ABNORMAL LOW (ref 12.0–15.0)
Immature Granulocytes: 7 %
Lymphocytes Relative: 7 %
Lymphs Abs: 0.9 10*3/uL (ref 0.7–4.0)
MCH: 37.4 pg — ABNORMAL HIGH (ref 26.0–34.0)
MCHC: 34.8 g/dL (ref 30.0–36.0)
MCV: 107.3 fL — ABNORMAL HIGH (ref 80.0–100.0)
Monocytes Absolute: 1.1 10*3/uL — ABNORMAL HIGH (ref 0.1–1.0)
Monocytes Relative: 9 %
Neutro Abs: 9.7 10*3/uL — ABNORMAL HIGH (ref 1.7–7.7)
Neutrophils Relative %: 74 %
Platelets: 77 10*3/uL — ABNORMAL LOW (ref 150–400)
RBC: 2.06 MIL/uL — ABNORMAL LOW (ref 3.87–5.11)
RDW: 16.4 % — ABNORMAL HIGH (ref 11.5–15.5)
WBC: 12.9 10*3/uL — ABNORMAL HIGH (ref 4.0–10.5)
nRBC: 0 % (ref 0.0–0.2)

## 2021-01-02 LAB — PROTIME-INR
INR: 2.4 — ABNORMAL HIGH (ref 0.8–1.2)
Prothrombin Time: 25.4 seconds — ABNORMAL HIGH (ref 11.4–15.2)

## 2021-01-02 LAB — BODY FLUID CELL COUNT WITH DIFFERENTIAL
Lymphs, Fluid: 26 %
Monocyte-Macrophage-Serous Fluid: 26 %
Neutrophil Count, Fluid: 48 %
Total Nucleated Cell Count, Fluid: 517 cu mm

## 2021-01-02 LAB — AMMONIA: Ammonia: 42 umol/L — ABNORMAL HIGH (ref 9–35)

## 2021-01-02 LAB — LACTIC ACID, PLASMA
Lactic Acid, Venous: 1.1 mmol/L (ref 0.5–1.9)
Lactic Acid, Venous: 1.3 mmol/L (ref 0.5–1.9)

## 2021-01-02 LAB — PATHOLOGIST SMEAR REVIEW

## 2021-01-02 MED ORDER — RIFAXIMIN 550 MG PO TABS
550.0000 mg | ORAL_TABLET | Freq: Two times a day (BID) | ORAL | Status: DC
  Administered 2021-01-02 – 2021-01-16 (×27): 550 mg via ORAL
  Filled 2021-01-02 (×29): qty 1

## 2021-01-02 MED ORDER — THIAMINE HCL 100 MG PO TABS: 50.0000 mg | ORAL_TABLET | Freq: Every day | ORAL | Status: DC

## 2021-01-02 MED ORDER — ALBUMIN HUMAN 25 % IV SOLN
25.0000 g | Freq: Once | INTRAVENOUS | Status: AC
  Administered 2021-01-02: 25 g via INTRAVENOUS

## 2021-01-02 MED ORDER — MORPHINE SULFATE (PF) 4 MG/ML IV SOLN
4.0000 mg | INTRAVENOUS | Status: DC | PRN
Start: 2021-01-02 — End: 2021-01-03
  Administered 2021-01-02 – 2021-01-03 (×3): 4 mg via INTRAVENOUS
  Filled 2021-01-02 (×3): qty 1

## 2021-01-02 MED ORDER — SODIUM CHLORIDE 0.9 % IV BOLUS
250.0000 mL | Freq: Once | INTRAVENOUS | Status: AC
  Administered 2021-01-02: 250 mL via INTRAVENOUS

## 2021-01-02 MED ORDER — FERROUS SULFATE 325 (65 FE) MG PO TABS
325.0000 mg | ORAL_TABLET | Freq: Every day | ORAL | Status: DC
  Administered 2021-01-03 – 2021-01-16 (×14): 325 mg via ORAL
  Filled 2021-01-02 (×15): qty 1

## 2021-01-02 MED ORDER — ADULT MULTIVITAMIN W/MINERALS CH
1.0000 | ORAL_TABLET | Freq: Every day | ORAL | Status: DC
  Administered 2021-01-03 – 2021-01-16 (×14): 1 via ORAL
  Filled 2021-01-02 (×14): qty 1

## 2021-01-02 MED ORDER — THIAMINE HCL 100 MG PO TABS
100.0000 mg | ORAL_TABLET | Freq: Every day | ORAL | Status: DC
  Administered 2021-01-03 – 2021-01-16 (×14): 100 mg via ORAL
  Filled 2021-01-02 (×14): qty 1

## 2021-01-02 MED ORDER — ENOXAPARIN SODIUM 40 MG/0.4ML ~~LOC~~ SOLN
40.0000 mg | SUBCUTANEOUS | Status: DC
  Filled 2021-01-02 (×5): qty 0.4

## 2021-01-02 MED ORDER — ONDANSETRON HCL 4 MG PO TABS
4.0000 mg | ORAL_TABLET | Freq: Four times a day (QID) | ORAL | Status: DC | PRN
  Administered 2021-01-09 – 2021-01-13 (×2): 4 mg via ORAL
  Filled 2021-01-02 (×2): qty 1

## 2021-01-02 MED ORDER — ONDANSETRON HCL 4 MG/2ML IJ SOLN
4.0000 mg | Freq: Once | INTRAMUSCULAR | Status: AC
  Administered 2021-01-02: 4 mg via INTRAVENOUS
  Filled 2021-01-02: qty 2

## 2021-01-02 MED ORDER — POTASSIUM CHLORIDE CRYS ER 10 MEQ PO TBCR
10.0000 meq | EXTENDED_RELEASE_TABLET | Freq: Every day | ORAL | Status: DC
  Administered 2021-01-02 – 2021-01-16 (×10): 10 meq via ORAL
  Filled 2021-01-02 (×13): qty 1

## 2021-01-02 MED ORDER — OXYCODONE HCL 5 MG PO TABS: 5.0000 mg | ORAL_TABLET | ORAL | Status: DC | PRN

## 2021-01-02 MED ORDER — TRAZODONE HCL 50 MG PO TABS
25.0000 mg | ORAL_TABLET | Freq: Every evening | ORAL | Status: DC | PRN
  Filled 2021-01-02: qty 1

## 2021-01-02 MED ORDER — MIDODRINE HCL 5 MG PO TABS
10.0000 mg | ORAL_TABLET | Freq: Three times a day (TID) | ORAL | Status: DC
Start: 2021-01-03 — End: 2021-01-16
  Administered 2021-01-03 – 2021-01-16 (×36): 10 mg via ORAL
  Filled 2021-01-02 (×37): qty 2

## 2021-01-02 MED ORDER — ACETAMINOPHEN 650 MG RE SUPP: 650.0000 mg | Freq: Four times a day (QID) | RECTAL | Status: DC | PRN

## 2021-01-02 MED ORDER — SPIRONOLACTONE 25 MG PO TABS
50.0000 mg | ORAL_TABLET | Freq: Every day | ORAL | Status: DC
  Administered 2021-01-02 – 2021-01-07 (×6): 50 mg via ORAL
  Filled 2021-01-02 (×6): qty 2

## 2021-01-02 MED ORDER — MAGNESIUM HYDROXIDE 400 MG/5ML PO SUSP
30.0000 mL | Freq: Every day | ORAL | Status: DC | PRN
  Administered 2021-01-04 – 2021-01-06 (×3): 30 mL via ORAL
  Filled 2021-01-02 (×3): qty 30

## 2021-01-02 MED ORDER — DIPHENHYDRAMINE HCL 25 MG PO CAPS
25.0000 mg | ORAL_CAPSULE | Freq: Four times a day (QID) | ORAL | Status: DC | PRN
  Administered 2021-01-02 – 2021-01-07 (×6): 25 mg via ORAL
  Filled 2021-01-02 (×7): qty 1

## 2021-01-02 MED ORDER — ALBUMIN HUMAN 25 % IV SOLN: 25.0000 g | Freq: Once | INTRAVENOUS | Status: AC

## 2021-01-02 MED ORDER — ACETAMINOPHEN 325 MG PO TABS: 650.0000 mg | ORAL_TABLET | Freq: Four times a day (QID) | ORAL | Status: DC | PRN

## 2021-01-02 MED ORDER — FOLIC ACID 1 MG PO TABS
1.0000 mg | ORAL_TABLET | Freq: Every day | ORAL | Status: DC
  Administered 2021-01-03 – 2021-01-16 (×14): 1 mg via ORAL
  Filled 2021-01-02 (×14): qty 1

## 2021-01-02 MED ORDER — SPIRONOLACTONE 25 MG PO TABS
100.0000 mg | ORAL_TABLET | Freq: Every day | ORAL | Status: DC
  Administered 2021-01-03 – 2021-01-07 (×5): 100 mg via ORAL
  Filled 2021-01-02 (×7): qty 4

## 2021-01-02 MED ORDER — FUROSEMIDE 40 MG PO TABS
60.0000 mg | ORAL_TABLET | Freq: Every day | ORAL | Status: DC
  Administered 2021-01-02 – 2021-01-04 (×3): 60 mg via ORAL
  Filled 2021-01-02 (×3): qty 2

## 2021-01-02 MED ORDER — SODIUM CHLORIDE 0.9 % IV SOLN: INTRAVENOUS | Status: DC

## 2021-01-02 MED ORDER — SODIUM CHLORIDE 0.9 % IV SOLN
1.0000 g | Freq: Once | INTRAVENOUS | Status: AC
  Administered 2021-01-02: 1 g via INTRAVENOUS
  Filled 2021-01-02: qty 10

## 2021-01-02 MED ORDER — ONDANSETRON HCL 4 MG/2ML IJ SOLN
4.0000 mg | Freq: Four times a day (QID) | INTRAMUSCULAR | Status: DC | PRN
  Administered 2021-01-03 – 2021-01-16 (×13): 4 mg via INTRAVENOUS
  Filled 2021-01-02 (×13): qty 2

## 2021-01-02 MED ORDER — SPIRONOLACTONE 25 MG PO TABS: 150.0000 mg | ORAL_TABLET | Freq: Every day | ORAL | Status: DC

## 2021-01-02 MED ORDER — ALBUMIN HUMAN 25 % IV SOLN
INTRAVENOUS | Status: AC
  Administered 2021-01-02: 25 g via INTRAVENOUS
  Filled 2021-01-02: qty 200

## 2021-01-02 MED ORDER — ADULT MULTIVITAMIN W/MINERALS CH: 1.0000 | ORAL_TABLET | Freq: Every day | ORAL | Status: DC

## 2021-01-02 NOTE — ED Provider Notes (Signed)
[facility] Emergency Department Provider Note    Event Date/Time   First MD Initiated Contact with Patient 01/02/21 1859     (approximate)  I have reviewed the triage vital signs and the nursing notes.   HISTORY  Chief Complaint infection    HPI Lindsey Huang is a 40 y.o. female with index extensive past medical history as listed below presents to the ER due to concern for SBP.  Patient is has Lindsey Huang alcoholic cirrhosis with ascites and had outpatient paracentesis performed today due to worsening abdominal pain and fairly rapid increase in her ascitic volume over the past few days.  Was found to have findings consistent with SBP and therefore was sent to the ER.  She has been on prophylactic Cipro.  She is been compliant with her medications.  Denies any melena or hematochezia.  Does have mild pain particularly with any movement.    Past Medical History:  Diagnosis Date  . Blood transfusion without reported diagnosis 2010   misscarriage  . Cirrhosis (HCC)   . Hx of incompetent cervix, currently pregnant   . Medical history non-contributory   . Portal hypertensive gastropathy (HCC)   . Thrombocytopenia (HCC)    Family History  Problem Relation Age of Onset  . Depression Maternal Aunt   . Alcohol abuse Neg Hx   . Arthritis Neg Hx   . Asthma Neg Hx   . Birth defects Neg Hx   . Cancer Neg Hx   . COPD Neg Hx   . Drug abuse Neg Hx   . Early death Neg Hx   . Diabetes Neg Hx   . Hearing loss Neg Hx   . Heart disease Neg Hx   . Hyperlipidemia Neg Hx   . Hypertension Neg Hx   . Kidney disease Neg Hx   . Learning disabilities Neg Hx   . Mental illness Neg Hx   . Mental retardation Neg Hx   . Miscarriages / Stillbirths Neg Hx   . Stroke Neg Hx   . Vision loss Neg Hx    Past Surgical History:  Procedure Laterality Date  . CERVICAL CERCLAGE     x4  . ESOPHAGOGASTRODUODENOSCOPY (EGD) WITH PROPOFOL N/A 10/19/2019   Procedure:  ESOPHAGOGASTRODUODENOSCOPY (EGD) WITH PROPOFOL;  Surgeon: Wyline Mood, MD;  Location: [facility] ENDOSCOPY;  Service: Gastroenterology;  Laterality: N/A;  . ESOPHAGOGASTRODUODENOSCOPY (EGD) WITH PROPOFOL N/A 11/24/2020   Procedure: ESOPHAGOGASTRODUODENOSCOPY (EGD) WITH PROPOFOL;  Surgeon: Regis Bill, MD;  Location: [facility] ENDOSCOPY;  Service: Endoscopy;  Laterality: N/A;  Would like to do first case of the day so before the first outpatient.   Patient Active Problem List   Diagnosis Date Noted  . Thrombocytopenia (HCC) 11/23/2020  . ABLA (acute blood loss anemia) 11/23/2020  . GI bleed 11/23/2020  . Spontaneous bacterial peritonitis (HCC) 05/28/2020  . Alcoholic cirrhosis of liver with ascites (HCC) 05/27/2020  . SBP (spontaneous bacterial peritonitis) (HCC) 05/27/2020  . Hyponatremia 05/27/2020  . Hyperbilirubinemia 05/27/2020  . Normocytic anemia 05/27/2020  . Decompensated hepatic cirrhosis (HCC) 10/18/2019  . Hypokalemia 10/18/2019  . Coagulopathy (HCC) 10/18/2019  . Total bilirubin, elevated 10/18/2019  . Hematemesis 10/18/2019  . Liver failure (HCC) 09/11/2019  . Indication for care in labor or delivery 12/25/2013  . NVD (normal vaginal delivery) 12/25/2013      Prior to Admission medications   Medication Sig Start Date End Date Taking? Authorizing Provider  ferrous sulfate 325 (65 FE) MG tablet Take 1 tablet (325  mg total) by mouth daily with breakfast. Patient not taking: Reported on 11/25/2020 10/09/20   Esaw Grandchild A, DO  furosemide (LASIX) 20 MG tablet Take 3 tablets (60 mg total) by mouth daily. 10/10/20   Pennie Banter, DO  lactulose (CHRONULAC) 10 GM/15ML solution Take 15 mLs (10 g total) by mouth 2 (two) times daily. 11/25/20   Enedina Finner, MD  Multiple Vitamin (MULTIVITAMIN WITH MINERALS) TABS tablet Take 1 tablet by mouth daily.    [provider]  oxyCODONE (ROXICODONE) 5 MG immediate release tablet Take 1-2 tablets (5-10 mg total) by mouth every 4  (four) hours as needed. 1 tablet for moderate pain, 2 tablets for severe pain 10/09/20   Esaw Grandchild A, DO  pantoprazole (PROTONIX) 40 MG tablet Take 1 tablet (40 mg total) by mouth daily. 11/25/20   Enedina Finner, MD  potassium chloride SA (KLOR-CON) 10 MEQ tablet Take 1 tablet (10 mEq total) by mouth daily. 10/10/20   Pennie Banter, DO  rifaximin (XIFAXAN) 550 MG TABS tablet Take 1 tablet (550 mg total) by mouth 2 (two) times daily. 10/09/20   Pennie Banter, DO  spironolactone (ALDACTONE) 50 MG tablet Take 3 tablets (150 mg total) by mouth daily. Patient taking differently: Take 150 mg by mouth daily. 2 am + 1 pm 10/10/20   Esaw Grandchild A, DO  thiamine (VITAMIN B-1) 50 MG tablet Take 50 mg by mouth daily.    [provider]    Allergies Patient has no known allergies.    Social History Social History   Tobacco Use  . Smoking status: Former Smoker    Types: Cigarettes    Quit date: 08/28/2019    Years since quitting: 1.3  . Smokeless tobacco: Never Used  Substance Use Topics  . Alcohol use: Not Currently    Comment: stopped in 08/2019  . Drug use: No    Review of Systems Patient denies headaches, rhinorrhea, blurry vision, numbness, shortness of breath, chest pain, edema, cough, abdominal pain, nausea, vomiting, diarrhea, dysuria, fevers, rashes or hallucinations unless otherwise stated above in HPI. ____________________________________________   PHYSICAL EXAM:  VITAL SIGNS: Vitals:   01/02/21 1915 01/02/21 1930  BP: (!) 111/58 (!) 106/59  Pulse: 98 97  Resp: 17   Temp:    SpO2: 100% 100%    Constitutional: Alert and oriented.  Eyes: Conjunctivae are normal. Scleral icterus Head: Atraumatic. Nose: No congestion/rhinnorhea. Mouth/Throat: Mucous membranes are moist.   Neck: No stridor. Painless ROM.  Cardiovascular: Normal rate, regular rhythm. Grossly normal heart sounds.  Good peripheral circulation. Respiratory: Normal respiratory effort.  No  retractions. Lungs CTAB. Gastrointestinal: Soft with mild ttp diffusely. No distention. No abdominal bruits. No CVA tenderness. Genitourinary:  Musculoskeletal: No lower extremity tenderness nor edema.  No joint effusions. Neurologic:  Normal speech and language. No gross focal neurologic deficits are appreciated. No facial droop Skin:  Skin is warm, dry and intact. No rash noted. Psychiatric: Mood and affect are normal. Speech and behavior are normal.  ____________________________________________   LABS (all labs ordered are listed, but only abnormal results are displayed)  Results for orders placed or performed during the hospital encounter of 01/02/21 (from the past 24 hour(s))  Comprehensive metabolic panel     Status: Abnormal   Collection Time: 01/02/21  6:26 PM  Result Value Ref Range   Sodium 125 (L) 135 - 145 mmol/L   Potassium 3.9 3.5 - 5.1 mmol/L   Chloride 94 (L) 98 - 111 mmol/L  CO2 22 22 - 32 mmol/L   Glucose, Bld 149 (H) 70 - 99 mg/dL   BUN 17 6 - 20 mg/dL   Creatinine, Ser 0.09 (L) 0.44 - 1.00 mg/dL   Calcium 8.9 8.9 - 38.1 mg/dL   Total Protein 6.6 6.5 - 8.1 g/dL   Albumin 4.0 3.5 - 5.0 g/dL   AST 69 (H) 15 - 41 U/L   ALT 34 0 - 44 U/L   Alkaline Phosphatase 155 (H) 38 - 126 U/L   Total Bilirubin 13.1 (H) 0.3 - 1.2 mg/dL   GFR, Estimated >82 >99 mL/min   Anion gap 9 5 - 15  Lactic acid, plasma     Status: None   Collection Time: 01/02/21  6:26 PM  Result Value Ref Range   Lactic Acid, Venous 1.1 0.5 - 1.9 mmol/L  CBC with Differential     Status: Abnormal   Collection Time: 01/02/21  6:26 PM  Result Value Ref Range   WBC 12.9 (H) 4.0 - 10.5 K/uL   RBC 2.06 (L) 3.87 - 5.11 MIL/uL   Hemoglobin 7.7 (L) 12.0 - 15.0 g/dL   HCT 37.1 (L) 69.6 - 78.9 %   MCV 107.3 (H) 80.0 - 100.0 fL   MCH 37.4 (H) 26.0 - 34.0 pg   MCHC 34.8 30.0 - 36.0 g/dL   RDW 38.1 (H) 01.7 - 51.0 %   Platelets 77 (L) 150 - 400 K/uL   nRBC 0.0 0.0 - 0.2 %   Neutrophils Relative % 74 %    Neutro Abs 9.7 (H) 1.7 - 7.7 K/uL   Lymphocytes Relative 7 %   Lymphs Abs 0.9 0.7 - 4.0 K/uL   Monocytes Relative 9 %   Monocytes Absolute 1.1 (H) 0.1 - 1.0 K/uL   Eosinophils Relative 2 %   Eosinophils Absolute 0.3 0.0 - 0.5 K/uL   Basophils Relative 1 %   Basophils Absolute 0.1 0.0 - 0.1 K/uL   Immature Granulocytes 7 %   Abs Immature Granulocytes 0.84 (H) 0.00 - 0.07 K/uL  Protime-INR     Status: Abnormal   Collection Time: 01/02/21  6:26 PM  Result Value Ref Range   Prothrombin Time 25.4 (H) 11.4 - 15.2 seconds   INR 2.4 (H) 0.8 - 1.2  Ammonia     Status: Abnormal   Collection Time: 01/02/21  6:26 PM  Result Value Ref Range   Ammonia 42 (H) 9 - 35 umol/L   ____________________________________________ ____________________________________________  RADIOLOGY I personally reviewed all radiographic images ordered to evaluate for the above acute complaints and reviewed radiology reports and findings.  These findings were personally discussed with the patient.  Please see medical record for radiology report.  ____________________________________________   PROCEDURES  Procedure(s) performed:  Procedures    Critical Care performed: no ____________________________________________   INITIAL IMPRESSION / ASSESSMENT AND PLAN / ED COURSE  Pertinent labs & imaging results that were available during my care of the patient were reviewed by me and considered in my medical decision making (see chart for details).   DDX: SBP, liver failure, electrolyte abnormality, pneumonia, dehydration  Lindsey Huang is a 40 y.o. who presents to the ED with presentation as described above.  Patient afebrile hemodynamically stable.  Blood work roughly at baseline with mild leukocytosis and review of paracentesis labs does meet criteria for SBP therefore will give Rocephin.  Will discuss with hospitalist for admission the hospital for further medical management.     The patient was evaluated in  Emergency Department today for the symptoms described in the history of present illness. He/she was evaluated in the context of the global COVID-19 pandemic, which necessitated consideration that the patient might be at risk for infection with the SARS-CoV-2 virus that causes COVID-19. Institutional protocols and algorithms that pertain to the evaluation of patients at risk for COVID-19 are in a state of rapid change based on information released by regulatory bodies including the CDC and federal and state organizations. These policies and algorithms were followed during the patient's care in the ED.  As part of my medical decision making, I reviewed the following data within the electronic MEDICAL RECORD NUMBER Nursing notes reviewed and incorporated, Labs reviewed, notes from prior ED visits and [place] Controlled Substance Database   ____________________________________________   FINAL CLINICAL IMPRESSION(S) / ED DIAGNOSES  Final diagnoses:  SBP (spontaneous bacterial peritonitis) (HCC)      NEW MEDICATIONS STARTED DURING THIS VISIT:  New Prescriptions   No medications on file     Note:  This document was prepared using Dragon voice recognition software and may include unintentional dictation errors.    Willy Eddy, MD 01/02/21 [phone]

## 2021-01-02 NOTE — ED Triage Notes (Addendum)
Pt to ED POV, states she had pericenticis this morning and was told to come back d/t finding infection.  Pt abdomen appears distended at this time, skin appears jaundice  Discussed pt with Dr Vicente Males

## 2021-01-02 NOTE — ED Notes (Signed)
Patients ABD dressing over procedure site on lower right abdomen changed by this RN due to leaking. ABD pads and tape placed over site. Patient states she was instructed to wrap her abdomen and push her umbilical hernia back into place. Patient assisted with dressing over incision, but not hernia incase that the attending wanted to assess it prior to wrapping it.

## 2021-01-02 NOTE — H&P (Addendum)
[place]   PATIENT NAME: Cleotha Whalin    MR#:  [id number]  DATE OF BIRTH:  1981/08/06  DATE OF ADMISSION:  01/02/2021  PRIMARY CARE PHYSICIAN: Lillia Corporal, MD   Patient is coming from: Home  REQUESTING/REFERRING PHYSICIAN: Merlyn Lot, MD  CHIEF COMPLAINT:   Chief Complaint  Patient presents with  . infection  in paracentesis fluid  HISTORY OF PRESENT ILLNESS:  Chonita Gadea is a 40 y.o. Caucasian female with medical history significant for liver cirrhosis and portal hypertension as well thrombocytopenia, who presented to the ER as he was sent by the IR clinic after having paracentesis today of 5 L of straw-colored peritoneal fluid that showed 26 monocytes, 517 neutrophils and was cloudy.  Fluid culture as well as blood culture were drawn.  The patient has been having worsening abdominal pain over the last couple days as well as increasing  ascitic fluid.  She has been prescribed prophylactic p.o. Cipro.  She admits to nausea without vomiting or melena or bright red bleeding per rectum.  No dysuria, oliguria or hematuria or flank pain.  No chest pain or palpitations.  ED Course: When he came to the ER blood pressure was 101/62 with otherwise normal vital signs.  Labs revealed hyponatremia 125 and hypochloremia 94 with alk phos 155 AST 69, ammonia level 42 and total bilirubin 13.1 with protein 6.6 albumin 4.  Lactic acid was 1.1 and later 1.3 Vayas showed leukocytosis of 12.9 as well as anemia with hemoglobin of 7.7 and hematocrit 22.1 close to baseline thrombocytopenia of 77 better than previous levels.  INR was one 2.4 and PTT 25.4.  COVID-19 PCR is currently pending.  Peritoneal fluid analysis as above  Imaging: Two-view chest x-ray showed no acute cardiopulmonary disease.  The patient was given a gram of IV Rocephin, 4 mg of IV morphine sulfate and 4 mg of IV Zofran.  She will be admitted to a medical bed for evaluation and management. PAST MEDICAL HISTORY:    Past Medical History:  Diagnosis Date  . Blood transfusion without reported diagnosis 2010   misscarriage  . Cirrhosis ([place])   . Hx of incompetent cervix, currently pregnant   . Medical history non-contributory   . Portal hypertensive gastropathy ([place])   . Thrombocytopenia ([place])     PAST SURGICAL HISTORY:   Past Surgical History:  Procedure Laterality Date  . CERVICAL CERCLAGE     x4  . ESOPHAGOGASTRODUODENOSCOPY (EGD) WITH PROPOFOL N/A 10/19/2019   Procedure: ESOPHAGOGASTRODUODENOSCOPY (EGD) WITH PROPOFOL;  Surgeon: Jonathon Bellows, MD;  Location: [facility] ENDOSCOPY;  Service: Gastroenterology;  Laterality: N/A;  . ESOPHAGOGASTRODUODENOSCOPY (EGD) WITH PROPOFOL N/A 11/24/2020   Procedure: ESOPHAGOGASTRODUODENOSCOPY (EGD) WITH PROPOFOL;  Surgeon: Lesly Rubenstein, MD;  Location: [facility] ENDOSCOPY;  Service: Endoscopy;  Laterality: N/A;  Would like to do first case of the day so before the first outpatient.    SOCIAL HISTORY:   Social History   Tobacco Use  . Smoking status: Former Smoker    Types: Cigarettes    Quit date: 08/28/2019    Years since quitting: 1.3  . Smokeless tobacco: Never Used  Substance Use Topics  . Alcohol use: Not Currently    Comment: stopped in 08/2019    FAMILY HISTORY:   Family History  Problem Relation Age of Onset  . Depression Maternal Aunt   . Alcohol abuse Neg Hx   . Arthritis Neg Hx   . Asthma Neg Hx   . Birth defects  Neg Hx   . Cancer Neg Hx   . COPD Neg Hx   . Drug abuse Neg Hx   . Early death Neg Hx   . Diabetes Neg Hx   . Hearing loss Neg Hx   . Heart disease Neg Hx   . Hyperlipidemia Neg Hx   . Hypertension Neg Hx   . Kidney disease Neg Hx   . Learning disabilities Neg Hx   . Mental illness Neg Hx   . Mental retardation Neg Hx   . Miscarriages / Stillbirths Neg Hx   . Stroke Neg Hx   . Vision loss Neg Hx     DRUG ALLERGIES:  No Known Allergies  REVIEW OF SYSTEMS:   ROS As per history of present illness. All pertinent  systems were reviewed above. Constitutional, HEENT, cardiovascular, respiratory, GI, GU, musculoskeletal, neuro, psychiatric, endocrine, integumentary and hematologic systems were reviewed and are otherwise negative/unremarkable except for positive findings mentioned above in the HPI.   MEDICATIONS AT HOME:   Prior to Admission medications   Medication Sig Start Date End Date Taking? Authorizing Provider  ferrous sulfate 325 (65 FE) MG tablet Take 1 tablet (325 mg total) by mouth daily with breakfast. Patient not taking: Reported on 11/25/2020 10/09/20   Nicole Kindred A, DO  furosemide (LASIX) 20 MG tablet Take 3 tablets (60 mg total) by mouth daily. 10/10/20   Ezekiel Slocumb, DO  lactulose (CHRONULAC) 10 GM/15ML solution Take 15 mLs (10 g total) by mouth 2 (two) times daily. 11/25/20   Fritzi Mandes, MD  Multiple Vitamin (MULTIVITAMIN WITH MINERALS) TABS tablet Take 1 tablet by mouth daily.    [provider]  oxyCODONE (ROXICODONE) 5 MG immediate release tablet Take 1-2 tablets (5-10 mg total) by mouth every 4 (four) hours as needed. 1 tablet for moderate pain, 2 tablets for severe pain 10/09/20   Nicole Kindred A, DO  pantoprazole (PROTONIX) 40 MG tablet Take 1 tablet (40 mg total) by mouth daily. 11/25/20   Fritzi Mandes, MD  potassium chloride SA (KLOR-CON) 10 MEQ tablet Take 1 tablet (10 mEq total) by mouth daily. 10/10/20   Ezekiel Slocumb, DO  rifaximin (XIFAXAN) 550 MG TABS tablet Take 1 tablet (550 mg total) by mouth 2 (two) times daily. 10/09/20   Ezekiel Slocumb, DO  spironolactone (ALDACTONE) 50 MG tablet Take 3 tablets (150 mg total) by mouth daily. Patient taking differently: Take 150 mg by mouth daily. 2 am + 1 pm 10/10/20   Nicole Kindred A, DO  thiamine (VITAMIN B-1) 50 MG tablet Take 50 mg by mouth daily.    [provider]      VITAL SIGNS:  Blood pressure (!) 106/59, pulse 97, temperature 98.2 F (36.8 C), temperature source Oral, resp. rate 17, height  4' 11" (1.499 m), weight 59 kg, SpO2 100 %, unknown if currently breastfeeding.  PHYSICAL EXAMINATION:  Physical Exam  GENERAL:  40 y.o.-year-old Caucasian female patient lying in the bed with no acute distress.  EYES: Pupils equal, round, reactive to light and accommodation. No scleral icterus. Extraocular muscles intact.  HEENT: Head atraumatic, normocephalic. Oropharynx and nasopharynx clear.  NECK:  Supple, no jugular venous distention. No thyroid enlargement, no tenderness.  LUNGS: Normal breath sounds bilaterally, no wheezing, rales,rhonchi or crepitation. No use of accessory muscles of respiration.  CARDIOVASCULAR: Regular rate and rhythm, S1, S2 normal. No murmurs, rubs, or gallops.  ABDOMEN: Soft, mildly distended with mild generalized tenderness without rebound guarding or rigidity.  Bowel sounds present. No organomegaly or mass.  EXTREMITIES: No pedal edema, cyanosis, or clubbing.  NEUROLOGIC: Cranial nerves II through XII are intact. Muscle strength 5/5 in all extremities. Sensation intact. Gait not checked.  PSYCHIATRIC: The patient is alert and oriented x 3.  Normal affect and good eye contact. SKIN: No obvious rash, lesion, or ulcer.   LABORATORY PANEL:   CBC Recent Labs  Lab 01/02/21 1826  WBC 12.9*  HGB 7.7*  HCT 22.1*  PLT 77*   ------------------------------------------------------------------------------------------------------------------  Chemistries  Recent Labs  Lab 01/02/21 1826  NA 125*  K 3.9  CL 94*  CO2 22  GLUCOSE 149*  BUN 17  CREATININE 0.34*  CALCIUM 8.9  AST 69*  ALT 34  ALKPHOS 155*  BILITOT 13.1*   ------------------------------------------------------------------------------------------------------------------  Cardiac Enzymes No results for input(s): TROPONINI in the last 168 hours. ------------------------------------------------------------------------------------------------------------------  RADIOLOGY:  DG Chest 2  View  Result Date: 01/02/2021 CLINICAL DATA:  Suspected sepsis, paracentesis morning with reported infection EXAM: CHEST - 2 VIEW COMPARISON:  Radiograph 11/23/2020 FINDINGS: No consolidation, features of edema, pneumothorax, or effusion. Pulmonary vascularity is normally distributed. The cardiomediastinal contours are unremarkable. No acute osseous or soft tissue abnormality. IMPRESSION: No acute cardiopulmonary abnormality. Electronically Signed   By: Lovena Le M.D.   On: 01/02/2021 19:01   US Paracentesis  Result Date: 01/02/2021 INDICATION: Patient with history of alcoholic cirrhosis with recurrent ascites. Request is for therapeutic and diagnostic paracentesis with a maximum of 5 L EXAM: ULTRASOUND GUIDED THERAPEUTIC AND DIAGNOSTIC PARACENTESIS MEDICATIONS: Lidocaine 1% 10 mL COMPLICATIONS: None immediate. PROCEDURE: Informed written consent was obtained from the patient after a discussion of the risks, benefits and alternatives to treatment. A timeout was performed prior to the initiation of the procedure. Initial ultrasound scanning demonstrates a large amount of ascites within the right lower abdominal quadrant. The right lower abdomen was prepped and draped in the usual sterile fashion. 1% lidocaine was used for local anesthesia. Following this, a 19 gauge, 7-cm, Yueh catheter was introduced. An ultrasound image was saved for documentation purposes. The paracentesis was performed. The catheter was removed and a dressing was applied. The patient tolerated the procedure well without immediate post procedural complication. Patient received post-procedure intravenous albumin; see nursing notes for details. FINDINGS: A total of approximately 5 L of straw-colored fluid was removed. Samples were sent to the laboratory as requested by the clinical team. IMPRESSION: Successful ultrasound-guided therapeutic and diagnostic paracentesis yielding 5 liters of peritoneal fluid. Read by: Rushie Nyhan, NP  Electronically Signed   By: Sandi Mariscal M.D.   On: 01/02/2021 10:09      IMPRESSION AND PLAN:  Active Problems:   SBP (spontaneous bacterial peritonitis) ([place])  1.  Spontaneous bacterial peritonitis. -The patient will be admitted to a medical bed. -We will continue antibiotic therapy with IV Rocephin. -Pain management will be provided. -GI consultation will be obtained. -I notified Dr. Allen Norris about the patient.  2.  Liver cirrhosis with ascites status post paracentesis with elevated ammonia level and coagulopathy. -We will continue monitoring LFTs and ammonia. -We will continue rifaximin. -Aldactone will be held off for hypotension. -The patient will be continued on thiamine and placed on multivitamins and folic acid.  3.  Hypotension. -The blood pressure is close to the patient is borderline. The she will get a bolus of 250 mL IV normal saline and will hold off Aldactone for now.  4.  Hyponatremia and hypochloremia. -The patient will be hydrated with IV normal saline  and will follow BMP.  -5.  GERD. -PPI therapy will be resumed.  DVT prophylaxis: SCDs.  No medical prophylaxis due to coagulopathy. Code Status: full code. Family Communication:  The plan of care was discussed in details with the patient (and family). I answered all questions. The patient agreed to proceed with the above mentioned plan. Further management will depend upon hospital course. Disposition Plan: Back to previous home environment Consults called: none. All the records are reviewed and case discussed with ED provider.  Status is: Inpatient  Remains inpatient appropriate because:Ongoing active pain requiring inpatient pain management, Ongoing diagnostic testing needed not appropriate for outpatient work up, Unsafe d/c plan, IV treatments appropriate due to intensity of illness or inability to take PO and Inpatient level of care appropriate due to severity of illness   Dispo: The patient is from: Home               Anticipated d/c is to: Home              Anticipated d/c date is: 2 days              Patient currently is not medically stable to d/c.   Difficult to place patient No   TOTAL TIME TAKING CARE OF THIS PATIENT: 60 minutes.    Christel Mormon M.D on 01/02/2021 at 9:01 PM  [facility]   From 7 PM-7 AM, contact night-coverage [email or web address]  CC: Primary care physician; Lillia Corporal, MD

## 2021-01-03 DIAGNOSIS — K7031 Alcoholic cirrhosis of liver with ascites: Secondary | ICD-10-CM

## 2021-01-03 DIAGNOSIS — K652 Spontaneous bacterial peritonitis: Secondary | ICD-10-CM | POA: Diagnosis not present

## 2021-01-03 LAB — COMPREHENSIVE METABOLIC PANEL
ALT: 30 U/L (ref 0–44)
AST: 61 U/L — ABNORMAL HIGH (ref 15–41)
Albumin: 3.2 g/dL — ABNORMAL LOW (ref 3.5–5.0)
Alkaline Phosphatase: 135 U/L — ABNORMAL HIGH (ref 38–126)
Anion gap: 7 (ref 5–15)
BUN: 13 mg/dL (ref 6–20)
CO2: 23 mmol/L (ref 22–32)
Calcium: 8 mg/dL — ABNORMAL LOW (ref 8.9–10.3)
Chloride: 96 mmol/L — ABNORMAL LOW (ref 98–111)
Creatinine, Ser: 0.35 mg/dL — ABNORMAL LOW (ref 0.44–1.00)
GFR, Estimated: >60 mL/min (ref >60)
Glucose, Bld: 119 mg/dL — ABNORMAL HIGH (ref 70–99)
Potassium: 3.9 mmol/L (ref 3.5–5.1)
Sodium: 126 mmol/L — ABNORMAL LOW (ref 135–145)
Total Bilirubin: 12.3 mg/dL — ABNORMAL HIGH (ref 0.3–1.2)
Total Protein: 5.4 g/dL — ABNORMAL LOW (ref 6.5–8.1)

## 2021-01-03 LAB — CBC
HCT: 21 % — ABNORMAL LOW (ref 36.0–46.0)
Hemoglobin: 7.3 g/dL — ABNORMAL LOW (ref 12.0–15.0)
MCH: 37.4 pg — ABNORMAL HIGH (ref 26.0–34.0)
MCHC: 34.8 g/dL (ref 30.0–36.0)
MCV: 107.7 fL — ABNORMAL HIGH (ref 80.0–100.0)
Platelets: 69 10*3/uL — ABNORMAL LOW (ref 150–400)
RBC: 1.95 MIL/uL — ABNORMAL LOW (ref 3.87–5.11)
RDW: 16.7 % — ABNORMAL HIGH (ref 11.5–15.5)
WBC: 11.2 10*3/uL — ABNORMAL HIGH (ref 4.0–10.5)
nRBC: 0 % (ref 0.0–0.2)

## 2021-01-03 LAB — URINALYSIS, COMPLETE (UACMP) WITH MICROSCOPIC
Bilirubin Urine: NEGATIVE
Glucose, UA: NEGATIVE mg/dL
Hgb urine dipstick: NEGATIVE
Ketones, ur: NEGATIVE mg/dL
Leukocytes,Ua: NEGATIVE
Nitrite: NEGATIVE
Protein, ur: NEGATIVE mg/dL
Specific Gravity, Urine: 1.009 (ref 1.005–1.030)
pH: 7 (ref 5.0–8.0)

## 2021-01-03 LAB — PROTIME-INR
INR: 2.8 — ABNORMAL HIGH (ref 0.8–1.2)
Prothrombin Time: 28.3 seconds — ABNORMAL HIGH (ref 11.4–15.2)

## 2021-01-03 LAB — SARS CORONAVIRUS 2 (TAT 6-24 HRS): SARS Coronavirus 2: NEGATIVE

## 2021-01-03 MED ORDER — ALBUMIN HUMAN 25 % IV SOLN
25.0000 g | Freq: Once | INTRAVENOUS | Status: AC
  Administered 2021-01-03: 25 g via INTRAVENOUS
  Filled 2021-01-03: qty 100

## 2021-01-03 MED ORDER — MORPHINE SULFATE (PF) 2 MG/ML IV SOLN
2.0000 mg | INTRAVENOUS | Status: DC | PRN
  Administered 2021-01-03 – 2021-01-05 (×8): 2 mg via INTRAVENOUS
  Filled 2021-01-03 (×8): qty 1

## 2021-01-03 MED ORDER — LACTULOSE 10 GM/15ML PO SOLN
10.0000 g | Freq: Two times a day (BID) | ORAL | Status: DC
  Administered 2021-01-03 – 2021-01-04 (×3): 10 g via ORAL
  Filled 2021-01-03 (×3): qty 30

## 2021-01-03 MED ORDER — SODIUM CHLORIDE 0.9 % IV SOLN
2.0000 g | INTRAVENOUS | Status: DC
  Administered 2021-01-03 – 2021-01-08 (×6): 2 g via INTRAVENOUS
  Filled 2021-01-03 (×2): qty 2
  Filled 2021-01-03: qty 20
  Filled 2021-01-03 (×2): qty 2
  Filled 2021-01-03: qty 20

## 2021-01-03 MED ORDER — PANTOPRAZOLE SODIUM 40 MG PO TBEC
40.0000 mg | DELAYED_RELEASE_TABLET | Freq: Every day | ORAL | Status: DC
  Administered 2021-01-03 – 2021-01-16 (×14): 40 mg via ORAL
  Filled 2021-01-03 (×14): qty 1

## 2021-01-03 MED ORDER — OXYCODONE HCL 5 MG PO TABS
5.0000 mg | ORAL_TABLET | ORAL | Status: DC | PRN
  Administered 2021-01-05 – 2021-01-09 (×12): 5 mg via ORAL
  Filled 2021-01-03 (×12): qty 1

## 2021-01-03 NOTE — Plan of Care (Signed)
Continuing with plan of care. 

## 2021-01-03 NOTE — Progress Notes (Signed)
PROGRESS NOTE    Lindsey Huang  TGG:[id number] DOB: Feb 08, 1981 DOA: 01/02/2021 PCP: Joya Martyr, MD   Brief Narrative:  40 y.o. Caucasian female with medical history significant for liver cirrhosis and portal hypertension as well thrombocytopenia, who presented to the ER as he was sent by the IR clinic after having paracentesis today of 5 L of straw-colored peritoneal fluid that showed 26 monocytes, 517 neutrophils and was cloudy.  Fluid culture as well as blood culture were drawn.  The patient has been having worsening abdominal pain over the last couple days as well as increasing  ascitic fluid.  She has been prescribed prophylactic p.o. Cipro.  She admits to nausea without vomiting or melena or bright red bleeding per rectum.  No dysuria, oliguria or hematuria or flank pain.  No chest pain or palpitations.  The patient has been getting and increasingly frequent paracentesis.  She is currently undergoing transplant evaluation at [facility].  She states she was previously hospitalized with spontaneous bacterial peritonitis at [facility].  Time of my evaluation patient is resting comfortably in bed.  She is in no visible distress.  She remains hemodynamically stable.  She is not encephalopathic.   Assessment & Plan:   Active Problems:   SBP (spontaneous bacterial peritonitis) (HCC)  Spontaneous bacterial peritonitis Noted after scheduled outpatient paracentesis 5 L removed, albumin administered per patient Plan: Continue Rocephin for presumed spontaneous bacterial peritonitis Follow cultures No IV fluids Avoid nephrotoxins  Hepatic cirrhosis Hyponatremia secondary to above Hypochloremia secondary to above Thrombocytopenia secondary to above Hyperbilirubinemia secondary to above Coagulopathy secondary to above Patient is being evaluated for transplant at [facility]  No signs of hepatic encephalopathy Plan: Continue lactulose Continue rifaximin Holding Aldactone Continue  Lasix Monitor volume status Daily CMP Patient may require additional paracentesis prior to discharge. Will reevaluate as she progresses  GERD PPI      DVT prophylaxis: SCD Code Status: Full Family Communication: None today. Offered to call but the patient declined Disposition Plan: Status is: Inpatient  Remains inpatient appropriate because:Inpatient level of care appropriate due to severity of illness   Dispo: The patient is from: Home              Anticipated d/c is to: Home              Anticipated d/c date is: 2 days              Patient currently is not medically stable to d/c.   Difficult to place patient No  Possible spontaneous bacterial peritonitis.     Level of care: Med-Surg  Consultants:   GI, signed off  Procedures:   Paracentesis  Antimicrobials:   Ceftriaxone   Subjective: Patient seen and examined. Reports improvement in abdominal distention since admission. No pain complaints.  Objective: Vitals:   01/02/21 1915 01/02/21 1930 01/02/21 2139 01/02/21 2300  BP: (!) 111/58 (!) 106/59 (!) 106/57   Pulse: 98 97 89   Resp: 17  18   Temp:      TempSrc:      SpO2: 100% 100% 100%   Weight:    66 kg  Height:    4\' 11"  (1.499 m)    Intake/Output Summary (Last 24 hours) at 01/03/2021 1153 Last data filed at 01/03/2021 1042 Gross per 24 hour  Intake 855 ml  Output 650 ml  Net 205 ml   Filed Weights   01/02/21 1821 01/02/21 2300  Weight: 59 kg 66 kg    Examination:  General exam: Appears calm and comfortable  Respiratory system: Clear to auscultation. Respiratory effort normal. Cardiovascular system: S1 & S2 heard, RRR. No JVD, murmurs, rubs, gallops or clicks. No pedal edema. Gastrointestinal system: Distended, nontender, positive fluid wave Central nervous system: Alert and oriented. No focal neurological deficits. Extremities: Symmetric 5 x 5 power. Skin: Icteric Psychiatry: Judgement and insight appear normal. Mood & affect  appropriate.     Data Reviewed: I have personally reviewed following labs and imaging studies  CBC: Recent Labs  Lab 01/02/21 1826 01/03/21 0521  WBC 12.9* 11.2*  NEUTROABS 9.7*  --   HGB 7.7* 7.3*  HCT 22.1* 21.0*  MCV 107.3* 107.7*  PLT 77* 69*   Basic Metabolic Panel: Recent Labs  Lab 01/02/21 1826 01/03/21 0521  NA 125* 126*  K 3.9 3.9  CL 94* 96*  CO2 22 23  GLUCOSE 149* 119*  BUN 17 13  CREATININE 0.34* 0.35*  CALCIUM 8.9 8.0*   GFR: Estimated Creatinine Clearance: 76.4 mL/min (A) (by C-G formula based on SCr of 0.35 mg/dL (L)). Liver Function Tests: Recent Labs  Lab 01/02/21 1826 01/03/21 0521  AST 69* 61*  ALT 34 30  ALKPHOS 155* 135*  BILITOT 13.1* 12.3*  PROT 6.6 5.4*  ALBUMIN 4.0 3.2*   No results for input(s): LIPASE, AMYLASE in the last 168 hours. Recent Labs  Lab 01/02/21 1826  AMMONIA 42*   Coagulation Profile: Recent Labs  Lab 01/02/21 1826 01/03/21 0521  INR 2.4* 2.8*   Cardiac Enzymes: No results for input(s): CKTOTAL, CKMB, CKMBINDEX, TROPONINI in the last 168 hours. BNP (last 3 results) No results for input(s): PROBNP in the last 8760 hours. HbA1C: No results for input(s): HGBA1C in the last 72 hours. CBG: No results for input(s): GLUCAP in the last 168 hours. Lipid Profile: No results for input(s): CHOL, HDL, LDLCALC, TRIG, CHOLHDL, LDLDIRECT in the last 72 hours. Thyroid Function Tests: No results for input(s): TSH, T4TOTAL, FREET4, T3FREE, THYROIDAB in the last 72 hours. Anemia Panel: No results for input(s): VITAMINB12, FOLATE, FERRITIN, TIBC, IRON, RETICCTPCT in the last 72 hours. Sepsis Labs: Recent Labs  Lab 01/02/21 1826 01/02/21 1950  LATICACIDVEN 1.1 1.3    Recent Results (from the past 240 hour(s))  Body fluid culture     Status: None (Preliminary result)   Collection Time: 01/02/21  8:45 AM   Specimen: Ascitic; Body Fluid  Result Value Ref Range Status   Specimen Description   Final     ASCITIC Performed at [facility], [address], [place], [place] [address]    Special Requests   Final    NONE Performed at [facility], [address] [place], [place] [address]    Gram Stain   Final    FEW WBC PRESENT, PREDOMINANTLY MONONUCLEAR NO ORGANISMS SEEN Performed at [facility], [address], [place], [place] [address]    Culture PENDING  Incomplete   Report Status PENDING  Incomplete  Culture, blood (Routine x 2)     Status: None (Preliminary result)   Collection Time: 01/02/21  6:26 PM   Specimen: BLOOD  Result Value Ref Range Status   Specimen Description BLOOD RIGHT ANTECUBITAL  Final   Special Requests   Final    BOTTLES DRAWN AEROBIC AND ANAEROBIC Blood Culture adequate volume   Culture   Final    NO GROWTH < 12 HOURS Performed at [facility], [address], [place], [place] [address]    Report  Status PENDING  Incomplete  Culture, blood (Routine x 2)     Status: None (Preliminary result)   Collection Time: 01/02/21  6:29 PM   Specimen: BLOOD  Result Value Ref Range Status   Specimen Description BLOOD RIGHT ANTECUBITAL  Final   Special Requests   Final    BOTTLES DRAWN AEROBIC AND ANAEROBIC Blood Culture adequate volume   Culture   Final    NO GROWTH < 12 HOURS Performed at [facility], [address] [place], [place] [address]    Report Status PENDING  Incomplete  SARS CORONAVIRUS 2 (TAT 6-24 HRS) Nasopharyngeal Nasopharyngeal Swab     Status: None   Collection Time: 01/02/21  7:50 PM   Specimen: Nasopharyngeal Swab  Result Value Ref Range Status   SARS Coronavirus 2 NEGATIVE NEGATIVE Final    Comment: (NOTE) SARS-CoV-2 target nucleic acids are NOT DETECTED.  The SARS-CoV-2 RNA is generally detectable in upper and lower respiratory specimens during the acute phase of infection. Negative results do not preclude SARS-CoV-2 infection, do not rule out co-infections with other pathogens, and should  not be used as the sole basis for treatment or other patient management decisions. Negative results must be combined with clinical observations, patient history, and epidemiological information. The expected result is Negative.  Fact Sheet for Patients: HairSlick.nohttps://www.fda.gov/media/138098/download  Fact Sheet for Healthcare Providers: quierodirigir.comhttps://www.fda.gov/media/138095/download  This test is not yet approved or cleared by the [place] FDA and  has been authorized for detection and/or diagnosis of SARS-CoV-2 by FDA under an Emergency Use Authorization (EUA). This EUA will remain  in effect (meaning this test can be used) for the duration of the COVID-19 declaration under Se ction 564(b)(1) of the Act, 21 U.S.C. section 360bbb-3(b)(1), unless the authorization is terminated or revoked sooner.  Performed at [facility], [address] [place], [place] [address]          Radiology Studies: DG Chest 2 View  Result Date: 01/02/2021 CLINICAL DATA:  Suspected sepsis, paracentesis morning with reported infection EXAM: CHEST - 2 VIEW COMPARISON:  Radiograph 11/23/2020 FINDINGS: No consolidation, features of edema, pneumothorax, or effusion. Pulmonary vascularity is normally distributed. The cardiomediastinal contours are unremarkable. No acute osseous or soft tissue abnormality. IMPRESSION: No acute cardiopulmonary abnormality. Electronically Signed   By: Kreg ShropshirePrice  DeHay M.D.   On: 01/02/2021 19:01   US Paracentesis  Result Date: 01/02/2021 INDICATION: Patient with history of alcoholic cirrhosis with recurrent ascites. Request is for therapeutic and diagnostic paracentesis with a maximum of 5 L EXAM: ULTRASOUND GUIDED THERAPEUTIC AND DIAGNOSTIC PARACENTESIS MEDICATIONS: Lidocaine 1% 10 mL COMPLICATIONS: None immediate. PROCEDURE: Informed written consent was obtained from the patient after a discussion of the risks, benefits and alternatives to treatment. A timeout was performed prior  to the initiation of the procedure. Initial ultrasound scanning demonstrates a large amount of ascites within the right lower abdominal quadrant. The right lower abdomen was prepped and draped in the usual sterile fashion. 1% lidocaine was used for local anesthesia. Following this, a 19 gauge, 7-cm, Yueh catheter was introduced. An ultrasound image was saved for documentation purposes. The paracentesis was performed. The catheter was removed and a dressing was applied. The patient tolerated the procedure well without immediate post procedural complication. Patient received post-procedure intravenous albumin; see nursing notes for details. FINDINGS: A total of approximately 5 L of straw-colored fluid was removed. Samples were sent to the laboratory as requested by the clinical team. IMPRESSION: Successful ultrasound-guided therapeutic and diagnostic paracentesis  yielding 5 liters of peritoneal fluid. Read by: Anders Grant, NP Electronically Signed   By: Simonne Come M.D.   On: 01/02/2021 10:09        Scheduled Meds: . enoxaparin (LOVENOX) injection  40 mg Subcutaneous Q24H  . ferrous sulfate  325 mg Oral Q breakfast  . folic acid  1 mg Oral Daily  . furosemide  60 mg Oral Daily  . lactulose  10 g Oral BID  . midodrine  10 mg Oral TID WC  . multivitamin with minerals  1 tablet Oral Daily  . pantoprazole  40 mg Oral Daily  . potassium chloride  10 mEq Oral Daily  . rifaximin  550 mg Oral BID  . spironolactone  100 mg Oral Daily  . spironolactone  50 mg Oral Daily  . thiamine  100 mg Oral Daily   Continuous Infusions: . cefTRIAXone (ROCEPHIN)  IV 2 g (01/03/21 0641)     LOS: 1 day    Time spent: 25 minutes    Tresa Moore, MD [facility] Pager [phone]  If 7PM-7AM, please contact night-coverage [email or web address] Password [facility] 01/03/2021, 11:53 AM

## 2021-01-03 NOTE — Consult Note (Signed)
Midge Minium, MD [facility]  [address] [place], [place] [address] Phone: [phone] Fax : [phone]  Consultation  Referring Provider:     No ref. provider found Primary Care Physician:  Waynard Reeds Suella Grove, MD Primary Gastroenterologist:  Joya Martyr, MD          Reason for Consultation:     SBP  Date of Admission:  01/02/2021 Date of Consultation:  01/03/2021         HPI:   Lindsey Huang is a 40 y.o. female who has been followed by [facility] transplant team for alcoholic cirrhosis.  The patient has not drank alcohol since 2020.  The patient was found to have an elevated white cell count on a paracentesis with the white cell count being 517 and the absolute neutrophil count of 248 based on the neutrophils being 48%.  The patient is on Cipro prophylactically for a history of SBP.  The patient has a meld score recently calculated at 36 and today on admission it was calculated at 32.  She reports that she has had episodes of abdominal swelling with a need for paracentesis on a weekly basis and it has happened where she had needed a paracentesis for an extended period of time.  She also reports that she had some abdominal pain associated with the abdominal distention and increased ascites.  She had been called by her primary gastroenterologist about the white cell count and her ascites and was told to go to the ER.  The patient was started on antibiotics.  She reports feeling well today.  There is no report of any fevers chills nausea vomiting black stools or bloody stools. The patient also reports that she is being reviewed again by the liver transplant team this week due to her increase meld score  Past Medical History:  Diagnosis Date  . Blood transfusion without reported diagnosis 2010   misscarriage  . Cirrhosis (HCC)   . Hx of incompetent cervix, currently pregnant   . Medical history non-contributory   . Portal hypertensive gastropathy (HCC)   . Thrombocytopenia  (HCC)     Past Surgical History:  Procedure Laterality Date  . CERVICAL CERCLAGE     x4  . ESOPHAGOGASTRODUODENOSCOPY (EGD) WITH PROPOFOL N/A 10/19/2019   Procedure: ESOPHAGOGASTRODUODENOSCOPY (EGD) WITH PROPOFOL;  Surgeon: Wyline Mood, MD;  Location[facility] ENDOSCOPY;  Service: Gastroenterology;  Laterality: N/A;  . ESOPHAGOGASTRODUODENOSCOPY (EGD) WITH PROPOFOL N/A 11/24/2020   Procedure: ESOPHAGOGASTRODUODENOSCOPY (EGD) WITH PROPOFOL;  Surgeon: Regis Bill, MD;  Location: [facility] ENDOSCOPY;  Service: Endoscopy;  Laterality: N/A;  Would like to do first case of the day so before the first outpatient.    Prior to Admission medications   Medication Sig Start Date End Date Taking? Authorizing Provider  ferrous sulfate 325 (65 FE) MG tablet Take 1 tablet (325 mg total) by mouth daily with breakfast. Patient not taking: Reported on 11/25/2020 10/09/20   Esaw Grandchild A, DO  furosemide (LASIX) 20 MG tablet Take 3 tablets (60 mg total) by mouth daily. 10/10/20   Pennie Banter, DO  lactulose (CHRONULAC) 10 GM/15ML solution Take 15 mLs (10 g total) by mouth 2 (two) times daily. 11/25/20   Enedina Finner, MD  Multiple Vitamin (MULTIVITAMIN WITH MINERALS) TABS tablet Take 1 tablet by mouth daily.    [provider]  oxyCODONE (ROXICODONE) 5 MG immediate release tablet Take 1-2 tablets (5-10 mg total) by mouth every 4 (four) hours as needed. 1 tablet for moderate  pain, 2 tablets for severe pain 10/09/20   Esaw GrandchildGriffith, Betsabe A, DO  pantoprazole (PROTONIX) 40 MG tablet Take 1 tablet (40 mg total) by mouth daily. 11/25/20   Enedina FinnerPatel, Sona, MD  potassium chloride SA (KLOR-CON) 10 MEQ tablet Take 1 tablet (10 mEq total) by mouth daily. 10/10/20   Pennie BanterGriffith, Jenna A, DO  rifaximin (XIFAXAN) 550 MG TABS tablet Take 1 tablet (550 mg total) by mouth 2 (two) times daily. 10/09/20   Pennie BanterGriffith, Quetzally A, DO  spironolactone (ALDACTONE) 50 MG tablet Take 3 tablets (150 mg total) by mouth daily. Patient taking  differently: Take 150 mg by mouth daily. 2 am + 1 pm 10/10/20   Esaw GrandchildGriffith, Sumiko A, DO  thiamine (VITAMIN B-1) 50 MG tablet Take 50 mg by mouth daily.    [provider]    Family History  Problem Relation Age of Onset  . Depression Maternal Aunt   . Alcohol abuse Neg Hx   . Arthritis Neg Hx   . Asthma Neg Hx   . Birth defects Neg Hx   . Cancer Neg Hx   . COPD Neg Hx   . Drug abuse Neg Hx   . Early death Neg Hx   . Diabetes Neg Hx   . Hearing loss Neg Hx   . Heart disease Neg Hx   . Hyperlipidemia Neg Hx   . Hypertension Neg Hx   . Kidney disease Neg Hx   . Learning disabilities Neg Hx   . Mental illness Neg Hx   . Mental retardation Neg Hx   . Miscarriages / Stillbirths Neg Hx   . Stroke Neg Hx   . Vision loss Neg Hx      Social History   Tobacco Use  . Smoking status: Former Smoker    Types: Cigarettes    Quit date: 08/28/2019    Years since quitting: 1.3  . Smokeless tobacco: Never Used  Substance Use Topics  . Alcohol use: Not Currently    Comment: stopped in 08/2019  . Drug use: No    Allergies as of 01/02/2021  . (No Known Allergies)    Review of Systems:    All systems reviewed and negative except where noted in HPI.   Physical Exam:  Vital signs in last 24 hours: Temp:  [97.7 F (36.5 C)-98.2 F (36.8 C)] 97.8 F (36.6 C) (02/12 1230) Pulse Rate:  [83-101] 83 (02/12 1230) Resp:  [16-20] 16 (02/12 1230) BP: (100-111)/(57-66) 100/58 (02/12 1230) SpO2:  [100 %] 100 % (02/12 1230) Weight:  [59 kg-66 kg] 66 kg (02/11 2300)   General:   Pleasant, cooperative in NAD Head:  Normocephalic and atraumatic. Eyes:   Positive scleral icterus.   Conjunctiva yellow. PERRLA. Ears:  Normal auditory acuity. Neck:  Supple; no masses or thyroidomegaly Lungs: Respirations even and unlabored. Lungs clear to auscultation bilaterally.   No wheezes, crackles, or rhonchi.  Heart:  Regular rate and rhythm;  Without murmur, clicks, rubs or gallops Abdomen:   Soft, positive distention, diffuse mild tenderness. Normal bowel sounds. No appreciable masses or hepatomegaly.  No rebound or guarding.  Positive fluid wave Rectal:  Not performed. Msk:  Symmetrical without gross deformities.    Extremities:  Without edema, cyanosis or clubbing. Neurologic:  Alert and oriented x3;  grossly normal neurologically. Skin:  Intact without significant lesions or rashes. Cervical Nodes:  No significant cervical adenopathy. Psych:  Alert and cooperative. Normal affect.  LAB RESULTS: Recent Labs    01/02/21 1826  01/03/21 0521  WBC 12.9* 11.2*  HGB 7.7* 7.3*  HCT 22.1* 21.0*  PLT 77* 69*   BMET Recent Labs    01/02/21 1826 01/03/21 0521  NA 125* 126*  K 3.9 3.9  CL 94* 96*  CO2 22 23  GLUCOSE 149* 119*  BUN 17 13  CREATININE 0.34* 0.35*  CALCIUM 8.9 8.0*   LFT Recent Labs    01/03/21 0521  PROT 5.4*  ALBUMIN 3.2*  AST 61*  ALT 30  ALKPHOS 135*  BILITOT 12.3*   PT/INR Recent Labs    01/02/21 1826 01/03/21 0521  LABPROT 25.4* 28.3*  INR 2.4* 2.8*    STUDIES: DG Chest 2 View  Result Date: 01/02/2021 CLINICAL DATA:  Suspected sepsis, paracentesis morning with reported infection EXAM: CHEST - 2 VIEW COMPARISON:  Radiograph 11/23/2020 FINDINGS: No consolidation, features of edema, pneumothorax, or effusion. Pulmonary vascularity is normally distributed. The cardiomediastinal contours are unremarkable. No acute osseous or soft tissue abnormality. IMPRESSION: No acute cardiopulmonary abnormality. Electronically Signed   By: Kreg Shropshire M.D.   On: 01/02/2021 19:01   US Paracentesis  Result Date: 01/02/2021 INDICATION: Patient with history of alcoholic cirrhosis with recurrent ascites. Request is for therapeutic and diagnostic paracentesis with a maximum of 5 L EXAM: ULTRASOUND GUIDED THERAPEUTIC AND DIAGNOSTIC PARACENTESIS MEDICATIONS: Lidocaine 1% 10 mL COMPLICATIONS: None immediate. PROCEDURE: Informed written consent was obtained from  the patient after a discussion of the risks, benefits and alternatives to treatment. A timeout was performed prior to the initiation of the procedure. Initial ultrasound scanning demonstrates a large amount of ascites within the right lower abdominal quadrant. The right lower abdomen was prepped and draped in the usual sterile fashion. 1% lidocaine was used for local anesthesia. Following this, a 19 gauge, 7-cm, Yueh catheter was introduced. An ultrasound image was saved for documentation purposes. The paracentesis was performed. The catheter was removed and a dressing was applied. The patient tolerated the procedure well without immediate post procedural complication. Patient received post-procedure intravenous albumin; see nursing notes for details. FINDINGS: A total of approximately 5 L of straw-colored fluid was removed. Samples were sent to the laboratory as requested by the clinical team. IMPRESSION: Successful ultrasound-guided therapeutic and diagnostic paracentesis yielding 5 liters of peritoneal fluid. Read by: Anders Grant, NP Electronically Signed   By: Simonne Come M.D.   On: 01/02/2021 10:09      Impression / Plan:   Assessment: Active Problems:   SBP (spontaneous bacterial peritonitis) (HCC)   Lindsey Huang is a 41 y.o. y/o female with end-stage liver disease and was sent to the hospital for a absolute neutrophil count of 248 while on Cipro prophylactically for SBP.  The patient has a meld score of 32 which translates to a 52% 67-month mortality.  She is being considered for a liver transplant at [facility].  Plan:  This patient has questionable SBP although she does not meet the criteria for an elevated ANC over 250 she did have the white cell count while on prophylactic antibiotics.  The patient had already been started on antibiotics prior to me seeing her today.  The patient should continue the treatment for SBP and will be following up with her liver team at [facility].   There is nothing further to do from a GI point of view since she is already being treated for possible SBP.  I would recommend staying away from any nephrotoxic medications due to her history of cirrhosis and risk of hepatorenal syndrome.  Treatment should be given for 5 days and then the patient should be started back on her prophylaxis.  I will sign off.  Please call if any further GI concerns or questions.  We would like to thank you for the opportunity to participate in the care of [employer].    Thank you for involving me in the care of this patient.      LOS: 1 day   Midge Minium, MD, [facility] 01/03/2021, 1:06 PM,  Pager [phone] 7am-5pm  Check AMION for 5pm -7am coverage and on weekends   Note: This dictation was prepared with Dragon dictation along with smaller phrase technology. Any transcriptional errors that result from this process are unintentional.

## 2021-01-03 NOTE — Progress Notes (Addendum)
Throughout the shift the site where patient had paracentesis yesterday has leaked despite use of surgicel with pressure dressing, Dr. Georgeann Oppenheim further suggested trying dermabond as well.  Dermabond was applied, but fluid started leaking through that as well.  Dr. Georgeann Oppenheim updated, Radiology contacted as well.  Dr. Georgeann Oppenheim ordered albumin to be administered and continuing with pressure dressing at this time.  Will continue to monitor and endorse to oncoming nurse.

## 2021-01-04 ENCOUNTER — Inpatient Hospital Stay: Payer: Medicaid Other

## 2021-01-04 DIAGNOSIS — K652 Spontaneous bacterial peritonitis: Secondary | ICD-10-CM | POA: Diagnosis not present

## 2021-01-04 LAB — AMMONIA: Ammonia: 34 umol/L (ref 9–35)

## 2021-01-04 MED ORDER — LACTULOSE 10 GM/15ML PO SOLN
20.0000 g | Freq: Two times a day (BID) | ORAL | Status: DC
  Administered 2021-01-04: 20 g via ORAL
  Filled 2021-01-04: qty 30

## 2021-01-04 MED ORDER — BISACODYL 10 MG RE SUPP: 10.0000 mg | Freq: Once | RECTAL | Status: DC

## 2021-01-04 MED ORDER — SENNOSIDES-DOCUSATE SODIUM 8.6-50 MG PO TABS
1.0000 | ORAL_TABLET | Freq: Two times a day (BID) | ORAL | Status: DC
  Administered 2021-01-04 – 2021-01-06 (×4): 1 via ORAL
  Filled 2021-01-04 (×7): qty 1

## 2021-01-04 MED ORDER — SODIUM CHLORIDE 0.9 % IV SOLN
INTRAVENOUS | Status: DC | PRN
  Administered 2021-01-04: 1000 mL via INTRAVENOUS
  Administered 2021-01-07 – 2021-01-13 (×3): 250 mL via INTRAVENOUS

## 2021-01-04 MED ORDER — LACTULOSE 10 GM/15ML PO SOLN: 20.0000 g | Freq: Two times a day (BID) | ORAL | Status: DC

## 2021-01-04 MED ORDER — LACTULOSE 10 GM/15ML PO SOLN
30.0000 g | Freq: Two times a day (BID) | ORAL | Status: DC
  Administered 2021-01-04: 30 g via ORAL
  Filled 2021-01-04: qty 60

## 2021-01-04 MED ORDER — ALUM & MAG HYDROXIDE-SIMETH 200-200-20 MG/5ML PO SUSP
30.0000 mL | ORAL | Status: DC | PRN
  Administered 2021-01-04: 30 mL via ORAL
  Filled 2021-01-04: qty 30

## 2021-01-04 MED ORDER — SPIRONOLACTONE 25 MG PO TABS
100.0000 mg | ORAL_TABLET | Freq: Every day | ORAL | Status: DC
Start: 2021-01-04 — End: 2021-01-04

## 2021-01-04 MED ORDER — FUROSEMIDE 40 MG PO TABS
60.0000 mg | ORAL_TABLET | Freq: Two times a day (BID) | ORAL | Status: DC
  Administered 2021-01-04 – 2021-01-05 (×3): 60 mg via ORAL
  Filled 2021-01-04 (×3): qty 1

## 2021-01-04 NOTE — Progress Notes (Signed)
The patient is reporting sharp pain to the abdominal area, abdomen noted to be firm and tight, patient also has not had a BM.  Patient reports feeling confused as well.  Dr. Georgeann Oppenheim notified and orders placed by him.

## 2021-01-04 NOTE — Progress Notes (Signed)
PROGRESS NOTE    Lindsey Huang  ZOX:[id number] DOB: Aug 08, 1981 DOA: 01/02/2021 PCP: Joya MartyrSegovia, Maria Cristina, MD   Brief Narrative:  40 y.o. Caucasian female with medical history significant for liver cirrhosis and portal hypertension as well thrombocytopenia, who presented to the ER as he was sent by the IR clinic after having paracentesis today of 5 L of straw-colored peritoneal fluid that showed 26 monocytes, 517 neutrophils and was cloudy.  Fluid culture as well as blood culture were drawn.  The patient has been having worsening abdominal pain over the last couple days as well as increasing  ascitic fluid.  She has been prescribed prophylactic p.o. Cipro.  She admits to nausea without vomiting or melena or bright red bleeding per rectum.  No dysuria, oliguria or hematuria or flank pain.  No chest pain or palpitations.  The patient has been getting and increasingly frequent paracentesis.  She is currently undergoing [facility].  She states she was previously hospitalized with spontaneous bacterial per[facility].  Patient has had issues with worsening abdominal distention.  Will likely require another paracentesis per 4 prior to discharge however repeats abdominal ultrasound performed 01/04/2021 demonstrates only small volume ascites.  Patient has not had a bowel movement yet, encephalopathy is worsening.   Assessment & Plan:   Active Problems:   SBP (spontaneous bacterial peritonitis) (HCC)  Spontaneous bacterial peritonitis Noted after scheduled outpatient paracentesis 5 L removed, albumin administered per patient Plan: Continue Rocephin for presumed spontaneous bacterial peritonitis Plan for 5-day antibiotic course Follow cultures, no growth to date No IV fluids Avoid nephrotoxins  Hepatic cirrhosis Hyponatremia secondary to above Hypochloremia secondary to above Thrombocytopenia secondary to above Hyperbilirubinemia secondary to above Coagulopathy  secondary to above Patient is [facility].  No overt hepatic encephalopathy however patient has not had bowel movement since admission Increasing abdominal distention causing some pain Plan: Increase lactulose to 20 g twice daily, titrate to 3-4 soft BM daily Continue rifaximin Resume Aldactone at 100 mg daily, home dose 150 mg Increase Lasix to 60 mg p.o. twice daily Monitor volume status Daily CMP Patient may require additional paracentesis prior to discharge. Will reevaluate as she progresses  GERD PPI      DVT prophylaxis: SCD Code Status: Full Family Communication: None today. Offered to call but the patient declined Disposition Plan: Status is: Inpatient  Remains inpatient appropriate because:Inpatient level of care appropriate due to severity of illness   Dispo: The patient is from: Home              Anticipated d/c is to: Home              Anticipated d/c date is: 2 days              Patient currently is not medically stable to d/c.   Difficult to place patient No  Treating for presumed spontaneous bacterial peritonitis.  Worsening abdominal distention in the setting of advanced cirrhosis.  Anticipate 48 hours prior to disposition planning.     Level of care: Med-Surg  Consultants:   GI, signed off  Procedures:   Paracentesis  Antimicrobials:   Ceftriaxone   Subjective: Patient seen and examined.  Leakage from paracentesis site is improving however abdominal distention is persistent.  Objective: Vitals:   01/02/21 1930 01/02/21 2139 01/02/21 2300 01/04/21 0800  BP: (!) 106/59 (!) 106/57  100/63  Pulse: 97 89  88  Resp:  18  17  Temp:  97.7 F (36.5 C)  TempSrc:    Axillary  SpO2: 100% 100%  100%  Weight:   66 kg   Height:   4\' 11"  (1.499 m)     Intake/Output Summary (Last 24 hours) at 01/04/2021 1442 Last data filed at 01/04/2021 0500 Gross per 24 hour  Intake -  Output 0 ml  Net 0 ml   Filed Weights    01/02/21 1821 01/02/21 2300  Weight: 59 kg 66 kg    Examination:  General exam: Appears calm and comfortable  Respiratory system: Clear to auscultation. Respiratory effort normal. Cardiovascular system: S1 & S2 heard, RRR. No JVD, murmurs, rubs, gallops or clicks. No pedal edema. Gastrointestinal system: Distended, mild tender to palpation Central nervous system: Alert and oriented. No focal neurological deficits. Extremities: Symmetric 5 x 5 power. Skin: Icteric Psychiatry: Judgement and insight appear normal. Mood & affect appropriate.     Data Reviewed: I have personally reviewed following labs and imaging studies  CBC: Recent Labs  Lab 01/02/21 1826 01/03/21 0521  WBC 12.9* 11.2*  NEUTROABS 9.7*  --   HGB 7.7* 7.3*  HCT 22.1* 21.0*  MCV 107.3* 107.7*  PLT 77* 69*   Basic Metabolic Panel: Recent Labs  Lab 01/02/21 1826 01/03/21 0521  NA 125* 126*  K 3.9 3.9  CL 94* 96*  CO2 22 23  GLUCOSE 149* 119*  BUN 17 13  CREATININE 0.34* 0.35*  CALCIUM 8.9 8.0*   GFR: Estimated Creatinine Clearance: 76.4 mL/min (A) (by C-G formula based on SCr of 0.35 mg/dL (L)). Liver Function Tests: Recent Labs  Lab 01/02/21 1826 01/03/21 0521  AST 69* 61*  ALT 34 30  ALKPHOS 155* 135*  BILITOT 13.1* 12.3*  PROT 6.6 5.4*  ALBUMIN 4.0 3.2*   No results for input(s): LIPASE, AMYLASE in the last 168 hours. Recent Labs  Lab 01/02/21 1826  AMMONIA 42*   Coagulation Profile: Recent Labs  Lab 01/02/21 1826 01/03/21 0521  INR 2.4* 2.8*   Cardiac Enzymes: No results for input(s): CKTOTAL, CKMB, CKMBINDEX, TROPONINI in the last 168 hours. BNP (last 3 results) No results for input(s): PROBNP in the last 8760 hours. HbA1C: No results for input(s): HGBA1C in the last 72 hours. CBG: No results for input(s): GLUCAP in the last 168 hours. Lipid Profile: No results for input(s): CHOL, HDL, LDLCALC, TRIG, CHOLHDL, LDLDIRECT in the last 72 hours. Thyroid Function Tests: No  results for input(s): TSH, T4TOTAL, FREET4, T3FREE, THYROIDAB in the last 72 hours. Anemia Panel: No results for input(s): VITAMINB12, FOLATE, FERRITIN, TIBC, IRON, RETICCTPCT in the last 72 hours. Sepsis Labs: Recent Labs  Lab 01/02/21 1826 01/02/21 1950  LATICACIDVEN 1.1 1.3    Recent Results (from the past 240 hour(s))  Body fluid culture     Status: None (Preliminary result)   Collection Time: 01/02/21  8:45 AM   Specimen: Ascitic; Body Fluid  Result Value Ref Range Status   Specimen Description   Final    ASCITIC Performed at [facility], [address] [place], [place]    Special Requests   Final    NONE Performed at [facility], [address] [place], [place]    Gram Stain   Final    FEW WBC PRESENT, PREDOMINANTLY MONONUCLEAR NO ORGANISMS SEEN    Culture   Final    NO GROWTH 1 DAY Performed at [facility], [address], [place], [place]    Report Status  PENDING  Incomplete  Culture, blood (Routine x 2)     Status: None (Preliminary result)   Collection Time: 01/02/21  6:26 PM   Specimen: BLOOD  Result Value Ref Range Status   Specimen Description BLOOD RIGHT ANTECUBITAL  Final   Special Requests   Final    BOTTLES DRAWN AEROBIC AND ANAEROBIC Blood Culture adequate volume   Culture   Final    NO GROWTH 2 DAYS Performed at [facility], [address], [place], [place] [address]    Report Status PENDING  Incomplete  Culture, blood (Routine x 2)     Status: None (Preliminary result)   Collection Time: 01/02/21  6:29 PM   Specimen: BLOOD  Result Value Ref Range Status   Specimen Description BLOOD RIGHT ANTECUBITAL  Final   Special Requests   Final    BOTTLES DRAWN AEROBIC AND ANAEROBIC Blood Culture adequate volume   Culture   Final    NO GROWTH 2 DAYS Performed at [facility], [address] [place], [place] [address]    Report Status PENDING  Incomplete  SARS CORONAVIRUS  2 (TAT 6-24 HRS) Nasopharyngeal Nasopharyngeal Swab     Status: None   Collection Time: 01/02/21  7:50 PM   Specimen: Nasopharyngeal Swab  Result Value Ref Range Status   SARS Coronavirus 2 NEGATIVE NEGATIVE Final    Comment: (NOTE) SARS-CoV-2 target nucleic acids are NOT DETECTED.  The SARS-CoV-2 RNA is generally detectable in upper and lower respiratory specimens during the acute phase of infection. Negative results do not preclude SARS-CoV-2 infection, do not rule out co-infections with other pathogens, and should not be used as the sole basis for treatment or other patient management decisions. Negative results must be combined with clinical observations, patient history, and epidemiological information. The expected result is Negative.  Fact Sheet for Patients: HairSlick.no  Fact Sheet for Healthcare Providers: quierodirigir.com  This test is not yet approved or cleared by the [place] FDA and  has been authorized for detection and/or diagnosis of SARS-CoV-2 by FDA under an Emergency Use Authorization (EUA). This EUA will remain  in effect (meaning this test can be used) for the duration of the COVID-19 declaration under Se ction 564(b)(1) of the Act, 21 U.S.C. section 360bbb-3(b)(1), unless the authorization is terminated or revoked sooner.  Performed at [facility], [address], [place], [place] [address]          Radiology Studies: DG Chest 2 View  Result Date: 01/02/2021 CLINICAL DATA:  Suspected sepsis, paracentesis morning with reported infection EXAM: CHEST - 2 VIEW COMPARISON:  Radiograph 11/23/2020 FINDINGS: No consolidation, features of edema, pneumothorax, or effusion. Pulmonary vascularity is normally distributed. The cardiomediastinal contours are unremarkable. No acute osseous or soft tissue abnormality. IMPRESSION: No acute cardiopulmonary abnormality. Electronically Signed   By: Kreg Shropshire M.D.   On: 01/02/2021 19:01   US Abdomen Limited  Result Date: 01/04/2021 CLINICAL DATA:  Evaluate ascites. EXAM: LIMITED ABDOMEN ULTRASOUND FOR ASCITES TECHNIQUE: Limited ultrasound survey for ascites was performed in all four abdominal quadrants. COMPARISON:  December 11, 2020. FINDINGS: Small ascites, mostly in the left lower quadrant. IMPRESSION: 1. Small ascites, not enough for paracentesis. Electronically Signed   By: Obie Dredge M.D.   On: 01/04/2021 11:39        Scheduled Meds: . enoxaparin (LOVENOX) injection  40 mg Subcutaneous Q24H  . ferrous sulfate  325 mg Oral Q breakfast  . folic acid  1 mg  Oral Daily  . furosemide  60 mg Oral BID  . lactulose  20 g Oral BID  . midodrine  10 mg Oral TID WC  . multivitamin with minerals  1 tablet Oral Daily  . pantoprazole  40 mg Oral Daily  . potassium chloride  10 mEq Oral Daily  . rifaximin  550 mg Oral BID  . senna-docusate  1 tablet Oral BID  . spironolactone  100 mg Oral Daily  . spironolactone  50 mg Oral Daily  . thiamine  100 mg Oral Daily   Continuous Infusions: . sodium chloride 1,000 mL (01/04/21 3244)  . cefTRIAXone (ROCEPHIN)  IV 2 g (01/04/21 0641)     LOS: 2 days    Time spent: 25 minutes    Tresa Moore, MD [facility] Pager [phone]  If 7PM-7AM, please contact night-coverage 01/04/2021, 2:42 PM

## 2021-01-04 NOTE — Care Management (Signed)
Patient has only had 1 hard stool today despite increasing dose of lactulose and adding BID sennakot Abdominal distention likely worsened by constipation Ab US demonstrating only small ascites, belly distended  Plan: Dulcolax suppository x 1 Continue lactulose, will uptitrate dose Check KUB, ro obstruction/ileus  Lolita Patella MD

## 2021-01-04 NOTE — Progress Notes (Deleted)
Dictation on: 01/04/2021  6:54 PM by: Lolita Patella [[id number]]

## 2021-01-04 NOTE — Progress Notes (Signed)
The patient reports that she continues to have a distended abdomen with some abdominal pain.  The patient also reports that she was taken down for an ultrasound today to see if they could tap more of the fluid off to decompress her abdominal distention.  It was reported that there is not a significant amount of fluid there.  The patient is now taking laxatives to help move her bowels.  Nothing further to do from a GI point of view.  Can pleat the treatment for her SBP and have her follow-up with [facility] transplant team on discharge.

## 2021-01-04 NOTE — Plan of Care (Signed)
Continuing with plan of care. 

## 2021-01-05 DIAGNOSIS — K652 Spontaneous bacterial peritonitis: Secondary | ICD-10-CM | POA: Diagnosis not present

## 2021-01-05 LAB — CBC WITH DIFFERENTIAL/PLATELET
Abs Immature Granulocytes: 0.43 10*3/uL — ABNORMAL HIGH (ref 0.00–0.07)
Basophils Absolute: 0.1 10*3/uL (ref 0.0–0.1)
Basophils Relative: 1 %
Eosinophils Absolute: 0.3 10*3/uL (ref 0.0–0.5)
Eosinophils Relative: 3 %
HCT: 19.2 % — ABNORMAL LOW (ref 36.0–46.0)
Hemoglobin: 6.7 g/dL — ABNORMAL LOW (ref 12.0–15.0)
Immature Granulocytes: 4 %
Lymphocytes Relative: 8 %
Lymphs Abs: 0.8 10*3/uL (ref 0.7–4.0)
MCH: 37.9 pg — ABNORMAL HIGH (ref 26.0–34.0)
MCHC: 34.9 g/dL (ref 30.0–36.0)
MCV: 108.5 fL — ABNORMAL HIGH (ref 80.0–100.0)
Monocytes Absolute: 1.1 10*3/uL — ABNORMAL HIGH (ref 0.1–1.0)
Monocytes Relative: 11 %
Neutro Abs: 7.5 10*3/uL (ref 1.7–7.7)
Neutrophils Relative %: 73 %
Platelets: 64 10*3/uL — ABNORMAL LOW (ref 150–400)
RBC: 1.77 MIL/uL — ABNORMAL LOW (ref 3.87–5.11)
RDW: 15.7 % — ABNORMAL HIGH (ref 11.5–15.5)
WBC: 10.2 10*3/uL (ref 4.0–10.5)
nRBC: 0 % (ref 0.0–0.2)

## 2021-01-05 LAB — COMPREHENSIVE METABOLIC PANEL
ALT: 35 U/L (ref 0–44)
AST: 72 U/L — ABNORMAL HIGH (ref 15–41)
Albumin: 3.2 g/dL — ABNORMAL LOW (ref 3.5–5.0)
Alkaline Phosphatase: 115 U/L (ref 38–126)
Anion gap: 7 (ref 5–15)
BUN: 15 mg/dL (ref 6–20)
CO2: 23 mmol/L (ref 22–32)
Calcium: 8.2 mg/dL — ABNORMAL LOW (ref 8.9–10.3)
Chloride: 96 mmol/L — ABNORMAL LOW (ref 98–111)
Creatinine, Ser: <0.3 mg/dL — ABNORMAL LOW (ref 0.44–1.00)
Glucose, Bld: 147 mg/dL — ABNORMAL HIGH (ref 70–99)
Potassium: 4.1 mmol/L (ref 3.5–5.1)
Sodium: 126 mmol/L — ABNORMAL LOW (ref 135–145)
Total Bilirubin: 15.9 mg/dL — ABNORMAL HIGH (ref 0.3–1.2)
Total Protein: 5.3 g/dL — ABNORMAL LOW (ref 6.5–8.1)

## 2021-01-05 LAB — BODY FLUID CULTURE: Culture: NO GROWTH

## 2021-01-05 MED ORDER — ACETAMINOPHEN 325 MG PO TABS
650.0000 mg | ORAL_TABLET | Freq: Three times a day (TID) | ORAL | Status: DC
  Administered 2021-01-05 – 2021-01-07 (×6): 650 mg via ORAL
  Filled 2021-01-05 (×8): qty 2

## 2021-01-05 MED ORDER — LACTULOSE 10 GM/15ML PO SOLN
20.0000 g | Freq: Two times a day (BID) | ORAL | Status: DC
  Administered 2021-01-05 (×2): 20 g via ORAL
  Filled 2021-01-05 (×2): qty 30

## 2021-01-05 NOTE — Progress Notes (Signed)
PROGRESS NOTE    Lindsey Huang  ZOX:[id number] DOB: [birth year] DOA: 01/02/2021 PCP: Joya MartyrSegovia, Maria Cristina, MD   Brief Narrative:  40 y.o. Caucasian female with medical history significant for liver cirrhosis and portal hypertension as well thrombocytopenia, who presented to the ER as he was sent by the IR clinic after having paracentesis today of 5 L of straw-colored peritoneal fluid that showed 26 monocytes, 517 neutrophils and was cloudy.  Fluid culture as well as blood culture were drawn.  The patient has been having worsening abdominal pain over the last couple days as well as increasing  ascitic fluid.  She has been prescribed prophylactic p.o. Cipro.  She admits to nausea without vomiting or melena or bright red bleeding per rectum.  No dysuria, oliguria or hematuria or flank pain.  No chest pain or palpitations.  The patient has been getting and increasingly frequent paracentesis.  She is currently undergoing [facility].  She states she was previously hospitalized with [facility].  Patient has had issues with worsening abdominal distention.  Will likely require another paracentesis per 4 prior to discharge however repeats abdominal ultrasound performed 01/04/2021 demonstrates only small volume ascites.   KUB demonstrating very large volume of stool within the colon.  Aggressive bowel regimen was initiated and was effective.  Patient's abdominal distention improving but not yet resolved.   Assessment & Plan:   Active Problems:   SBP (spontaneous bacterial peritonitis) (HCC)  Spontaneous bacterial peritonitis Noted after scheduled outpatient paracentesis 5 L removed, albumin administered per patient Persistent abdominal distention, caused by constipation not ascites Plan: Continue Rocephin for presumed spontaneous bacterial peritonitis Plan for 5-day antibiotic course Follow cultures, no growth to date No IV fluids Avoid  nephrotoxins  Hepatic cirrhosis Hyponatremia secondary to above Hypochloremia secondary to above Thrombocytopenia secondary to above Hyperbilirubinemia secondary to above Coagulopathy secondary to above Patient is [facility].  No overt hepatic encephalopathy however patient has not had bowel movement since admission Increasing abdominal distention causing some pain Plan: Increase lactulose to 20 g twice daily, titrate to 3-4 soft BM daily Continue rifaximin Resume Aldactone at 100 mg daily, home dose 150 mg Increase Lasix to 60 mg p.o. twice daily Monitor volume status Daily CMP Patient may require additional paracentesis prior to discharge. Will reevaluate as she progresses  Constipation KUB demonstrating large amount of stool in the colon without evidence of ileus or obstruction Aggressive bowel regimen initiated 01/04/2021 with good result Plan: Lactulose 20 g twice daily Senokot 1 tab twice daily Daily as needed Dulcolax pulsatory Titrate regimen to 2-3 soft BM daily Avoid diarrhea or constipation If abdominal distention improved possible discharge home on 01/06/2021  GERD PPI  DVT prophylaxis: SCD Code Status: Full Family Communication: None today. Offered to call but the patient declined Disposition Plan: Status is: Inpatient  Remains inpatient appropriate because:Inpatient level of care appropriate due to severity of illness   Dispo: The patient is from: Home              Anticipated d/c is to: Home              Anticipated d/c date is: 1 day              Patient currently is not medically stable to d/c.   Difficult to place patient No  Still under treatment for presumed spontaneous bacterial peritonitis.  Abdominal distention improving.  Driven by constipation.  If patient continues to have  soft bowel movements and abdominal distention improved anticipate discharge home on 01/06/2021.     Level of care: Med-Surg  Consultants:    GI, signed off  Procedures:   Paracentesis  Antimicrobials:   Ceftriaxone   Subjective: Patient seen and examined.  Persistent leakage from paracentesis site.  Abdominal distention present but improving  Objective: Vitals:   01/02/21 2300 01/04/21 0800 01/04/21 1600 01/05/21 1300  BP:  100/63 110/60   Pulse:  88 86   Resp:  17 (!) 21   Temp:  97.7 F (36.5 C) 97.9 F (36.6 C) 98 F (36.7 C)  TempSrc:  Axillary Oral   SpO2:  100% 98%   Weight: 66 kg     Height: 4\' 11"  (1.499 m)       Intake/Output Summary (Last 24 hours) at 01/05/2021 1359 Last data filed at 01/05/2021 1353 Gross per 24 hour  Intake 940.54 ml  Output --  Net 940.54 ml   Filed Weights   01/02/21 1821 01/02/21 2300  Weight: 59 kg 66 kg    Examination:  General exam: Appears calm and comfortable  Respiratory system: Clear to auscultation. Respiratory effort normal. Cardiovascular system: S1 & S2 heard, RRR. No JVD, murmurs, rubs, gallops or clicks. No pedal edema. Gastrointestinal system: Distended, mildly tender to palpation, positive bowel sounds Central nervous system: Alert and oriented. No focal neurological deficits. Extremities: Symmetric 5 x 5 power. Skin: Icteric Psychiatry: Judgement and insight appear normal. Mood & affect appropriate.     Data Reviewed: I have personally reviewed following labs and imaging studies  CBC: Recent Labs  Lab 01/02/21 1826 01/03/21 0521 01/05/21 0442  WBC 12.9* 11.2* 10.2  NEUTROABS 9.7*  --  7.5  HGB 7.7* 7.3* 6.7*  HCT 22.1* 21.0* 19.2*  MCV 107.3* 107.7* 108.5*  PLT 77* 69* 64*   Basic Metabolic Panel: Recent Labs  Lab 01/02/21 1826 01/03/21 0521 01/05/21 0442  NA 125* 126* 126*  K 3.9 3.9 4.1  CL 94* 96* 96*  CO2 22 23 23   GLUCOSE 149* 119* 147*  BUN 17 13 15   CREATININE 0.34* 0.35* <0.30*  CALCIUM 8.9 8.0* 8.2*   GFR: CrCl cannot be calculated (This lab value cannot be used to calculate CrCl because it is not a number:  <0.30). Liver Function Tests: Recent Labs  Lab 01/02/21 1826 01/03/21 0521 01/05/21 0442  AST 69* 61* 72*  ALT 34 30 35  ALKPHOS 155* 135* 115  BILITOT 13.1* 12.3* 15.9*  PROT 6.6 5.4* 5.3*  ALBUMIN 4.0 3.2* 3.2*   No results for input(s): LIPASE, AMYLASE in the last 168 hours. Recent Labs  Lab 01/02/21 1826 01/04/21 1428  AMMONIA 42* 34   Coagulation Profile: Recent Labs  Lab 01/02/21 1826 01/03/21 0521  INR 2.4* 2.8*   Cardiac Enzymes: No results for input(s): CKTOTAL, CKMB, CKMBINDEX, TROPONINI in the last 168 hours. BNP (last 3 results) No results for input(s): PROBNP in the last 8760 hours. HbA1C: No results for input(s): HGBA1C in the last 72 hours. CBG: No results for input(s): GLUCAP in the last 168 hours. Lipid Profile: No results for input(s): CHOL, HDL, LDLCALC, TRIG, CHOLHDL, LDLDIRECT in the last 72 hours. Thyroid Function Tests: No results for input(s): TSH, T4TOTAL, FREET4, T3FREE, THYROIDAB in the last 72 hours. Anemia Panel: No results for input(s): VITAMINB12, FOLATE, FERRITIN, TIBC, IRON, RETICCTPCT in the last 72 hours. Sepsis Labs: Recent Labs  Lab 01/02/21 1826 01/02/21 1950  LATICACIDVEN 1.1 1.3    Recent Results (  from the past 240 hour(s))  Body fluid culture     Status: None (Preliminary result)   Collection Time: 01/02/21  8:45 AM   Specimen: Ascitic; Body Fluid  Result Value Ref Range Status   Specimen Description   Final    ASCITIC Performed at [facility], [address] [place], [place] [address]    Special Requests   Final    NONE Performed at [facility], [address] [place], [place] [address]    Gram Stain   Final    FEW WBC PRESENT, PREDOMINANTLY MONONUCLEAR NO ORGANISMS SEEN    Culture   Final    NO GROWTH 2 DAYS Performed at [facility], [address] [place], [place] [address]    Report Status PENDING  Incomplete  Culture, blood (Routine x 2)     Status: None (Preliminary  result)   Collection Time: 01/02/21  6:26 PM   Specimen: BLOOD  Result Value Ref Range Status   Specimen Description BLOOD RIGHT ANTECUBITAL  Final   Special Requests   Final    BOTTLES DRAWN AEROBIC AND ANAEROBIC Blood Culture adequate volume   Culture   Final    NO GROWTH 3 DAYS Performed at [facility], [address], [place], [place] [address]    Report Status PENDING  Incomplete  Culture, blood (Routine x 2)     Status: None (Preliminary result)   Collection Time: 01/02/21  6:29 PM   Specimen: BLOOD  Result Value Ref Range Status   Specimen Description BLOOD RIGHT ANTECUBITAL  Final   Special Requests   Final    BOTTLES DRAWN AEROBIC AND ANAEROBIC Blood Culture adequate volume   Culture   Final    NO GROWTH 3 DAYS Performed at [facility], [address], [place], [place] [address]    Report Status PENDING  Incomplete  SARS CORONAVIRUS 2 (TAT 6-24 HRS) Nasopharyngeal Nasopharyngeal Swab     Status: None   Collection Time: 01/02/21  7:50 PM   Specimen: Nasopharyngeal Swab  Result Value Ref Range Status   SARS Coronavirus 2 NEGATIVE NEGATIVE Final    Comment: (NOTE) SARS-CoV-2 target nucleic acids are NOT DETECTED.  The SARS-CoV-2 RNA is generally detectable in upper and lower respiratory specimens during the acute phase of infection. Negative results do not preclude SARS-CoV-2 infection, do not rule out co-infections with other pathogens, and should not be used as the sole basis for treatment or other patient management decisions. Negative results must be combined with clinical observations, patient history, and epidemiological information. The expected result is Negative.  Fact Sheet for Patients: HairSlick.no  Fact Sheet for Healthcare Providers: quierodirigir.com  This test is not yet approved or cleared by the [place] FDA and  has been authorized for detection and/or diagnosis of  SARS-CoV-2 by FDA under an Emergency Use Authorization (EUA). This EUA will remain  in effect (meaning this test can be used) for the duration of the COVID-19 declaration under Se ction 564(b)(1) of the Act, 21 U.S.C. section 360bbb-3(b)(1), unless the authorization is terminated or revoked sooner.  Performed at [facility], [address] [place], [place] [address]          Radiology Studies: DG Abd 1 View  Result Date: 01/04/2021 CLINICAL DATA:  Constipation EXAM: ABDOMEN - 1 VIEW COMPARISON:  None. FINDINGS: The bowel gas pattern is nonobstructive. There is a very large amount of stool throughout the colon. There is no  visualized acute osseous abnormality. No radiopaque kidney stones. IMPRESSION: Very large amount of stool throughout the colon. No evidence of bowel obstruction. Electronically Signed   By: Katherine Mantle M.D.   On: 01/04/2021 19:44   US Abdomen Limited  Result Date: 01/04/2021 CLINICAL DATA:  Evaluate ascites. EXAM: LIMITED ABDOMEN ULTRASOUND FOR ASCITES TECHNIQUE: Limited ultrasound survey for ascites was performed in all four abdominal quadrants. COMPARISON:  December 11, 2020. FINDINGS: Small ascites, mostly in the left lower quadrant. IMPRESSION: 1. Small ascites, not enough for paracentesis. Electronically Signed   By: Obie Dredge M.D.   On: 01/04/2021 11:39        Scheduled Meds: . acetaminophen  650 mg Oral Q8H  . bisacodyl  10 mg Rectal Once  . enoxaparin (LOVENOX) injection  40 mg Subcutaneous Q24H  . ferrous sulfate  325 mg Oral Q breakfast  . folic acid  1 mg Oral Daily  . furosemide  60 mg Oral BID  . lactulose  20 g Oral BID  . midodrine  10 mg Oral TID WC  . multivitamin with minerals  1 tablet Oral Daily  . pantoprazole  40 mg Oral Daily  . potassium chloride  10 mEq Oral Daily  . rifaximin  550 mg Oral BID  . senna-docusate  1 tablet Oral BID  . spironolactone  100 mg Oral Daily  . spironolactone  50 mg Oral Daily  .  thiamine  100 mg Oral Daily   Continuous Infusions: . sodium chloride 1,000 mL (01/04/21 4680)  . cefTRIAXone (ROCEPHIN)  IV 2 g (01/05/21 3212)     LOS: 3 days    Time spent: 25 minutes    Tresa Moore, MD [facility] Pager [phone]  If 7PM-7AM, please contact night-coverage 01/05/2021, 1:59 PM

## 2021-01-06 ENCOUNTER — Inpatient Hospital Stay: Payer: Medicaid Other

## 2021-01-06 DIAGNOSIS — K652 Spontaneous bacterial peritonitis: Secondary | ICD-10-CM | POA: Diagnosis not present

## 2021-01-06 LAB — CBC WITH DIFFERENTIAL/PLATELET
Abs Immature Granulocytes: 0.77 10*3/uL — ABNORMAL HIGH (ref 0.00–0.07)
Basophils Absolute: 0.1 10*3/uL (ref 0.0–0.1)
Basophils Relative: 1 %
Eosinophils Absolute: 0.3 10*3/uL (ref 0.0–0.5)
Eosinophils Relative: 3 %
HCT: 19.6 % — ABNORMAL LOW (ref 36.0–46.0)
Hemoglobin: 6.6 g/dL — ABNORMAL LOW (ref 12.0–15.0)
Immature Granulocytes: 7 %
Lymphocytes Relative: 9 %
Lymphs Abs: 1 10*3/uL (ref 0.7–4.0)
MCH: 36.9 pg — ABNORMAL HIGH (ref 26.0–34.0)
MCHC: 33.7 g/dL (ref 30.0–36.0)
MCV: 109.5 fL — ABNORMAL HIGH (ref 80.0–100.0)
Monocytes Absolute: 1.2 10*3/uL — ABNORMAL HIGH (ref 0.1–1.0)
Monocytes Relative: 11 %
Neutro Abs: 7.4 10*3/uL (ref 1.7–7.7)
Neutrophils Relative %: 69 %
Platelets: 74 10*3/uL — ABNORMAL LOW (ref 150–400)
RBC: 1.79 MIL/uL — ABNORMAL LOW (ref 3.87–5.11)
RDW: 15.7 % — ABNORMAL HIGH (ref 11.5–15.5)
Smear Review: NORMAL
WBC: 10.8 10*3/uL — ABNORMAL HIGH (ref 4.0–10.5)
nRBC: 0 % (ref 0.0–0.2)

## 2021-01-06 LAB — COMPREHENSIVE METABOLIC PANEL
ALT: 32 U/L (ref 0–44)
AST: 66 U/L — ABNORMAL HIGH (ref 15–41)
Albumin: 3.2 g/dL — ABNORMAL LOW (ref 3.5–5.0)
Alkaline Phosphatase: 133 U/L — ABNORMAL HIGH (ref 38–126)
Anion gap: 4 — ABNORMAL LOW (ref 5–15)
BUN: 14 mg/dL (ref 6–20)
CO2: 24 mmol/L (ref 22–32)
Calcium: 8.4 mg/dL — ABNORMAL LOW (ref 8.9–10.3)
Chloride: 98 mmol/L (ref 98–111)
Creatinine, Ser: 0.37 mg/dL — ABNORMAL LOW (ref 0.44–1.00)
GFR, Estimated: >60 mL/min (ref >60)
Glucose, Bld: 139 mg/dL — ABNORMAL HIGH (ref 70–99)
Potassium: 4.7 mmol/L (ref 3.5–5.1)
Sodium: 126 mmol/L — ABNORMAL LOW (ref 135–145)
Total Bilirubin: 12.8 mg/dL — ABNORMAL HIGH (ref 0.3–1.2)
Total Protein: 5.2 g/dL — ABNORMAL LOW (ref 6.5–8.1)

## 2021-01-06 LAB — HEMOGLOBIN AND HEMATOCRIT, BLOOD
HCT: 25.1 % — ABNORMAL LOW (ref 36.0–46.0)
Hemoglobin: 8.7 g/dL — ABNORMAL LOW (ref 12.0–15.0)

## 2021-01-06 LAB — PREPARE RBC (CROSSMATCH)

## 2021-01-06 MED ORDER — SODIUM CHLORIDE 0.9% IV SOLUTION: Freq: Once | INTRAVENOUS | Status: AC

## 2021-01-06 MED ORDER — FUROSEMIDE 40 MG PO TABS: 40.0000 mg | ORAL_TABLET | Freq: Two times a day (BID) | ORAL | Status: DC

## 2021-01-06 MED ORDER — LACTULOSE 10 GM/15ML PO SOLN
30.0000 g | Freq: Two times a day (BID) | ORAL | Status: DC
  Administered 2021-01-06 – 2021-01-07 (×4): 30 g via ORAL
  Filled 2021-01-06 (×7): qty 60

## 2021-01-06 MED ORDER — FUROSEMIDE 20 MG PO TABS
60.0000 mg | ORAL_TABLET | Freq: Every day | ORAL | Status: DC
  Administered 2021-01-07 – 2021-01-09 (×3): 60 mg via ORAL
  Filled 2021-01-06 (×3): qty 1

## 2021-01-06 NOTE — TOC Initial Note (Signed)
Transition of Care [facility]) - Initial/Assessment Note    Patient Details  Name: Lindsey Huang MRN: [id number] Date of Birth: 01-09-1981  Transition of Care [facility]) CM/SW Contact:    [name], RN Phone Number: 01/06/2021, 2:25 PM  Clinical Narrative:                  Patient with high risk for readmission score                 Patient admitted for SBP Lives at home with  her children and a roommate Followed be her liver specialist [place] at [facility].  She is not interested in getting established with a new PCP  Ph[facility]acy [facility] - denies issues obtaining medications Denies issues with transportation.   Patient denies any current alcohol of substance abuse  No anticipated needs for discharge.  Please consult TOC if indicated  Expected Discharge Plan: Home/Self Care Barriers to Discharge: Continued Medical Work up   Patient Goals and CMS Choice        Expected Discharge Plan and Services Expected Discharge Plan: Home/Self Care                                              Prior Living Arrangements/Services   Lives with:: Minor Children Patient language and need for interpreter reviewed:: Yes Do you feel safe going back to the place where you live?: Yes            Criminal Activity/Legal Involvement Pertinent to Current Situation/Hospitalization: No - Comment as needed  Activities of Daily Living Home Assistive Devices/Equipment: None ADL Screening (condition at time of admission) Patient's cognitive ability adequate to safely complete daily activities?: Yes Is the patient deaf or have difficulty hearing?: No Does the patient have difficulty seeing, even when wearing glasses/contacts?: No Does the patient have difficulty concentrating, remembering, or making decisions?: No Patient able to express need for assistance with ADLs?: Yes Does the patient have difficulty dressing or bathing?: No Independently performs ADLs?: Yes (appropriate for  developmental age) Does the patient have difficulty walking or climbing stairs?: Yes Weakness of Legs: Both Weakness of Arms/Hands: None  Permission Sought/Granted                  Emotional Assessment       Orientation: : Oriented to Self,Oriented to Place,Oriented to  Time,Oriented to Situation      Admission diagnosis:  SBP (spontaneous bacterial peritonitis) (HCC) [K65.2] Patient Active Problem List   Diagnosis Date Noted  . Thrombocytopenia (HCC) 11/23/2020  . ABLA (acute blood loss anemia) 11/23/2020  . GI bleed 11/23/2020  . Spontaneous bacterial peritonitis (HCC) 05/28/2020  . Alcoholic cirrhosis of liver with ascites (HCC) 05/27/2020  . SBP (spontaneous bacterial peritonitis) (HCC) 05/27/2020  . Hyponatremia 05/27/2020  . Hyperbilirubinemia 05/27/2020  . Normocytic anemia 05/27/2020  . Decompensated hepatic cirrhosis (HCC) 10/18/2019  . Hypokalemia 10/18/2019  . Coagulopathy (HCC) 10/18/2019  . Total bilirubin, elevated 10/18/2019  . Hematemesis 10/18/2019  . Liver failure (HCC) 09/11/2019  . Indication for care in labor or delivery 12/25/2013  . NVD (normal vaginal delivery) 12/25/2013   PCP:  Joya Martyr, MD Pharmacy:   [facility] - [place], [place] - [phone] [place] [address] Phone: [phone] Fax: [phone]  [facility] - [place], [place] -  [address] [place] [address] Phone: [phone] Fax: [phone]  [id number] - [place], [place] - [address] [place] [address] Phone: [phone] Fax: [phone]     Social Determinants of Health (SDOH) Interventions    Readmission Risk Interventions Readmission Risk Prevention Plan 01/06/2021 11/25/2020 10/08/2020  Transportation Screening Complete Complete Complete  PCP or Specialist Appt within 3-5 Days - Complete -  HRI or Home Care Consult - - (No  Data)  Social Work Consult for Recovery Care Planning/Counseling - Complete -  Palliative Care Screening Not Applicable Not Applicable Not Applicable  Medication Review (RN Care Manager) Complete Complete Complete  Some recent data might be hidden

## 2021-01-06 NOTE — Progress Notes (Signed)
PROGRESS NOTE    Lindsey Huang  NLZ:[id number] DOB: September 18, 1981 DOA: 01/02/2021 PCP: Joya Martyr, MD   Brief Narrative:  40 y.o. Caucasian female with medical history significant for liver cirrhosis and portal hypertension as well thrombocytopenia, who presented to the ER as he was sent by the IR clinic after having paracentesis today of 5 L of straw-colored peritoneal fluid that showed 26 monocytes, 517 neutrophils and was cloudy.  Fluid culture as well as blood culture were drawn.  The patient has been having worsening abdominal pain over the last couple days as well as increasing  ascitic fluid.  She has been prescribed prophylactic p.o. Cipro.  She admits to nausea without vomiting or melena or bright red bleeding per rectum.  No dysuria, oliguria or hematuria or flank pain.  No chest pain or palpitations.  The patient has been getting and increasingly frequent paracentesis.  She is currently undergoing transplant evaluation at [facility].  She states she was previously hospitalized with spontaneous bacterial peritonitis at [facility].  Patient has had issues with worsening abdominal distention.  Will likely require another paracentesis per 4 prior to discharge however repeats abdominal ultrasound performed 01/04/2021 demonstrates only small volume ascites.   KUB demonstrating very large volume of stool within the colon.  Aggressive bowel regimen was initiated and was effective.  Patient's abdominal distention improving but not yet resolved.   Assessment & Plan:   Active Problems:   SBP (spontaneous bacterial peritonitis) (HCC)  Spontaneous bacterial peritonitis Noted after scheduled outpatient paracentesis 5 L removed, albumin administered per patient Persistent abdominal distention, caused by constipation not ascites Plan: Continue Rocephin for presumed spontaneous bacterial peritonitis Complete 5-day antibiotic course Last dose 01/07/2021 Follow cultures, no growth to date No IV  fluids Avoid nephrotoxins Aggressive bowel regimen Repeat limited abdominal ultrasound As patient is still having some leakage from the paracentesis site if there is substantial fluid will consult interventional radiology for repeat paracentesis  Hepatic cirrhosis Hyponatremia secondary to above Hypochloremia secondary to above Thrombocytopenia secondary to above Hyperbilirubinemia secondary to above Coagulopathy secondary to above Patient is being evaluated for transplant at [facility].  No overt hepatic encephalopathy however patient has not had bowel movement since admission Increasing abdominal distention causing some pain Suspect this is likely driven by constipation is abdominal ultrasound demonstrates only small volume ascites Plan: Increase lactulose to 30 g twice daily, titrate to 3-4 soft BM daily Continue rifaximin Aldactone 50 mg daily Lasix 60 mg daily Monitor volume status Daily CMP Patient may require additional paracentesis prior to discharge.  Repeat chest x-ray.  Will reevaluate as she progresses  Constipation KUB demonstrating large amount of stool in the colon without evidence of ileus or obstruction Aggressive bowel regimen initiated 01/04/2021 with good result Only one BM reported 01/05/2021 Plan: Increase lactulose to 30 g twice daily point Senokot 1 tab twice daily Daily as needed Dulcolax suppository Titrate regimen to 2-3 soft BM daily Avoid diarrhea or constipation Possible discharge to 1622 if constipation resolved and abdominal distention improved  GERD PPI  DVT prophylaxis: SCD Code Status: Full Family Communication: None today. Offered to call but the patient declined Disposition Plan: Status is: Inpatient  Remains inpatient appropriate because:Inpatient level of care appropriate due to severity of illness   Dispo: The patient is from: Home              Anticipated d/c is to: Home              Anticipated d/c date is: 1  day               Patient currently is not medically stable to d/c.   Difficult to place patient No  Last dose of treatment course for spontaneous bacterial peritonitis 01/07/2021.  Still with some constipation, we are making progress.  Repeating limited ultrasound to assess for any residual fluid collection.  If substantial fluid exists consult IR for repeat paracentesis prior to discharge versus as outpatient     Level of care: Med-Surg  Consultants:   GI, signed off  Procedures:   Paracentesis  Antimicrobials:   Ceftriaxone   Subjective: Patient seen and examined.  Persistent leakage from paracentesis site.  Abdominal distention present but improving  Objective: Vitals:   01/05/21 1300 01/05/21 2321 01/06/21 1315 01/06/21 1335  BP: 105/62 (!) 100/53 (!) 99/57 (!) 99/54  Pulse:  88 88 89  Resp:  18 16 16   Temp: 98 F (36.7 C) 98.2 F (36.8 C) 98 F (36.7 C) 97.7 F (36.5 C)  TempSrc:   Oral   SpO2: 100% 99% 99% 99%  Weight:      Height:        Intake/Output Summary (Last 24 hours) at 01/06/2021 1337 Last data filed at 01/06/2021 1305 Gross per 24 hour  Intake 240 ml  Output 2050 ml  Net -1810 ml   Filed Weights   01/02/21 1821 01/02/21 2300  Weight: 59 kg 66 kg    Examination:  General exam: Appears calm and comfortable  Respiratory system: Clear to auscultation. Respiratory effort normal. Cardiovascular system: S1 & S2 heard, RRR. No JVD, murmurs, rubs, gallops or clicks. No pedal edema. Gastrointestinal system: Distended, mildly tender to palpation, positive bowel sounds Central nervous system: Alert and oriented. No focal neurological deficits. Extremities: Symmetric 5 x 5 power. Skin: Icteric Psychiatry: Judgement and insight appear normal. Mood & affect appropriate.     Data Reviewed: I have personally reviewed following labs and imaging studies  CBC: Recent Labs  Lab 01/02/21 1826 01/03/21 0521 01/05/21 0442 01/06/21 0520  WBC 12.9* 11.2* 10.2 10.8*   NEUTROABS 9.7*  --  7.5 7.4  HGB 7.7* 7.3* 6.7* 6.6*  HCT 22.1* 21.0* 19.2* 19.6*  MCV 107.3* 107.7* 108.5* 109.5*  PLT 77* 69* 64* 74*   Basic Metabolic Panel: Recent Labs  Lab 01/02/21 1826 01/03/21 0521 01/05/21 0442 01/06/21 0520  NA 125* 126* 126* 126*  K 3.9 3.9 4.1 4.7  CL 94* 96* 96* 98  CO2 22 23 23 24   GLUCOSE 149* 119* 147* 139*  BUN 17 13 15 14   CREATININE 0.34* 0.35* <0.30* 0.37*  CALCIUM 8.9 8.0* 8.2* 8.4*   GFR: Estimated Creatinine Clearance: 76.4 mL/min (A) (by C-G formula based on SCr of 0.37 mg/dL (L)). Liver Function Tests: Recent Labs  Lab 01/02/21 1826 01/03/21 0521 01/05/21 0442 01/06/21 0520  AST 69* 61* 72* 66*  ALT 34 30 35 32  ALKPHOS 155* 135* 115 133*  BILITOT 13.1* 12.3* 15.9* 12.8*  PROT 6.6 5.4* 5.3* 5.2*  ALBUMIN 4.0 3.2* 3.2* 3.2*   No results for input(s): LIPASE, AMYLASE in the last 168 hours. Recent Labs  Lab 01/02/21 1826 01/04/21 1428  AMMONIA 42* 34   Coagulation Profile: Recent Labs  Lab 01/02/21 1826 01/03/21 0521  INR 2.4* 2.8*   Cardiac Enzymes: No results for input(s): CKTOTAL, CKMB, CKMBINDEX, TROPONINI in the last 168 hours. BNP (last 3 results) No results for input(s): PROBNP in the last 8760 hours. HbA1C: No results for  input(s): HGBA1C in the last 72 hours. CBG: No results for input(s): GLUCAP in the last 168 hours. Lipid Profile: No results for input(s): CHOL, HDL, LDLCALC, TRIG, CHOLHDL, LDLDIRECT in the last 72 hours. Thyroid Function Tests: No results for input(s): TSH, T4TOTAL, FREET4, T3FREE, THYROIDAB in the last 72 hours. Anemia Panel: No results for input(s): VITAMINB12, FOLATE, FERRITIN, TIBC, IRON, RETICCTPCT in the last 72 hours. Sepsis Labs: Recent Labs  Lab 01/02/21 1826 01/02/21 1950  LATICACIDVEN 1.1 1.3    Recent Results (from the past 240 hour(s))  Body fluid culture     Status: None (Preliminary result)   Collection Time: 01/02/21  8:45 AM   Specimen: Ascitic; Body Fluid   Result Value Ref Range Status   Specimen Description   Final    ASCITIC Performed at [facility], [address] [place], [place] [address]    Special Requests   Final    NONE Performed at [facility], [address] [place], [place] [address]    Gram Stain   Final    FEW WBC PRESENT, PREDOMINANTLY MONONUCLEAR NO ORGANISMS SEEN    Culture   Final    NO GROWTH 2 DAYS Performed at [facility], [address] [place], [place] [address]    Report Status PENDING  Incomplete  Culture, blood (Routine x 2)     Status: None (Preliminary result)   Collection Time: 01/02/21  6:26 PM   Specimen: BLOOD  Result Value Ref Range Status   Specimen Description BLOOD RIGHT ANTECUBITAL  Final   Special Requests   Final    BOTTLES DRAWN AEROBIC AND ANAEROBIC Blood Culture adequate volume   Culture   Final    NO GROWTH 4 DAYS Performed at [facility], [address] [place], [place] [address]    Report Status PENDING  Incomplete  Culture, blood (Routine x 2)     Status: None (Preliminary result)   Collection Time: 01/02/21  6:29 PM   Specimen: BLOOD  Result Value Ref Range Status   Specimen Description BLOOD RIGHT ANTECUBITAL  Final   Special Requests   Final    BOTTLES DRAWN AEROBIC AND ANAEROBIC Blood Culture adequate volume   Culture   Final    NO GROWTH 4 DAYS Performed at [facility], [address], [place], [place] [address]    Report Status PENDING  Incomplete  SARS CORONAVIRUS 2 (TAT 6-24 HRS) Nasopharyngeal Nasopharyngeal Swab     Status: None   Collection Time: 01/02/21  7:50 PM   Specimen: Nasopharyngeal Swab  Result Value Ref Range Status   SARS Coronavirus 2 NEGATIVE NEGATIVE Final    Comment: (NOTE) SARS-CoV-2 target nucleic acids are NOT DETECTED.  The SARS-CoV-2 RNA is generally detectable in upper and lower respiratory specimens during the acute phase of infection. Negative results do not preclude SARS-CoV-2  infection, do not rule out co-infections with other pathogens, and should not be used as the sole basis for treatment or other patient management decisions. Negative results must be combined with clinical observations, patient history, and epidemiological information. The expected result is Negative.  Fact Sheet for Patients: HairSlick.no  Fact Sheet for Healthcare Providers: quierodirigir.com  This test is not yet approved or cleared by the [place] FDA and  has been authorized for detection and/or diagnosis of SARS-CoV-2 by FDA under an Emergency Use Authorization (EUA). This EUA will remain  in effect (meaning this test can be used) for the duration of  the COVID-19 declaration under Se ction 564(b)(1) of the Act, 21 U.S.C. section 360bbb-3(b)(1), unless the authorization is terminated or revoked sooner.  Performed at [facility], [address] [place], [place] [address]          Radiology Studies: DG Abd 1 View  Result Date: 01/04/2021 CLINICAL DATA:  Constipation EXAM: ABDOMEN - 1 VIEW COMPARISON:  None. FINDINGS: The bowel gas pattern is nonobstructive. There is a very large amount of stool throughout the colon. There is no visualized acute osseous abnormality. No radiopaque kidney stones. IMPRESSION: Very large amount of stool throughout the colon. No evidence of bowel obstruction. Electronically Signed   By: Katherine Mantlehristopher  Green M.D.   On: 01/04/2021 19:44        Scheduled Meds: . acetaminophen  650 mg Oral Q8H  . bisacodyl  10 mg Rectal Once  . enoxaparin (LOVENOX) injection  40 mg Subcutaneous Q24H  . ferrous sulfate  325 mg Oral Q breakfast  . folic acid  1 mg Oral Daily  . furosemide  40 mg Oral BID  . lactulose  30 g Oral BID  . midodrine  10 mg Oral TID WC  . multivitamin with minerals  1 tablet Oral Daily  . pantoprazole  40 mg Oral Daily  . potassium chloride  10 mEq Oral Daily  .  rifaximin  550 mg Oral BID  . senna-docusate  1 tablet Oral BID  . spironolactone  100 mg Oral Daily  . spironolactone  50 mg Oral Daily  . thiamine  100 mg Oral Daily   Continuous Infusions: . sodium chloride 1,000 mL (01/04/21 [id number])  . cefTRIAXone (ROCEPHIN)  IV 2 g (01/06/21 0606)     LOS: 4 days    Time spent: 25 minutes    Tresa MooreSudheer B Melessa Cowell, MD [facility] Pager [phone]  If 7PM-7AM, please contact night-coverage 01/06/2021, 1:37 PM

## 2021-01-07 ENCOUNTER — Inpatient Hospital Stay: Payer: Medicaid Other

## 2021-01-07 DIAGNOSIS — R140 Abdominal distension (gaseous): Secondary | ICD-10-CM

## 2021-01-07 DIAGNOSIS — K7290 Hepatic failure, unspecified without coma: Secondary | ICD-10-CM

## 2021-01-07 DIAGNOSIS — K7460 Unspecified cirrhosis of liver: Secondary | ICD-10-CM

## 2021-01-07 DIAGNOSIS — K652 Spontaneous bacterial peritonitis: Secondary | ICD-10-CM | POA: Diagnosis not present

## 2021-01-07 DIAGNOSIS — R188 Other ascites: Secondary | ICD-10-CM | POA: Diagnosis not present

## 2021-01-07 DIAGNOSIS — K5900 Constipation, unspecified: Secondary | ICD-10-CM | POA: Diagnosis not present

## 2021-01-07 LAB — TYPE AND SCREEN
ABO/RH(D): A POS
Antibody Screen: NEGATIVE
Unit division: 0

## 2021-01-07 LAB — COMPREHENSIVE METABOLIC PANEL
ALT: 36 U/L (ref 0–44)
AST: 80 U/L — ABNORMAL HIGH (ref 15–41)
Albumin: 3.2 g/dL — ABNORMAL LOW (ref 3.5–5.0)
Alkaline Phosphatase: 121 U/L (ref 38–126)
Anion gap: 6 (ref 5–15)
BUN: 13 mg/dL (ref 6–20)
CO2: 22 mmol/L (ref 22–32)
Calcium: 8.8 mg/dL — ABNORMAL LOW (ref 8.9–10.3)
Chloride: 95 mmol/L — ABNORMAL LOW (ref 98–111)
Creatinine, Ser: <0.3 mg/dL — ABNORMAL LOW (ref 0.44–1.00)
Glucose, Bld: 119 mg/dL — ABNORMAL HIGH (ref 70–99)
Potassium: 4.8 mmol/L (ref 3.5–5.1)
Sodium: 123 mmol/L — ABNORMAL LOW (ref 135–145)
Total Bilirubin: 16.2 mg/dL — ABNORMAL HIGH (ref 0.3–1.2)
Total Protein: 5.2 g/dL — ABNORMAL LOW (ref 6.5–8.1)

## 2021-01-07 LAB — CBC WITH DIFFERENTIAL/PLATELET
Abs Immature Granulocytes: 0.96 10*3/uL — ABNORMAL HIGH (ref 0.00–0.07)
Basophils Absolute: 0.1 10*3/uL (ref 0.0–0.1)
Basophils Relative: 1 %
Eosinophils Absolute: 0.5 10*3/uL (ref 0.0–0.5)
Eosinophils Relative: 4 %
HCT: 22.3 % — ABNORMAL LOW (ref 36.0–46.0)
Hemoglobin: 8.1 g/dL — ABNORMAL LOW (ref 12.0–15.0)
Immature Granulocytes: 8 %
Lymphocytes Relative: 8 %
Lymphs Abs: 1.1 10*3/uL (ref 0.7–4.0)
MCH: 37.2 pg — ABNORMAL HIGH (ref 26.0–34.0)
MCHC: 36.3 g/dL — ABNORMAL HIGH (ref 30.0–36.0)
MCV: 102.3 fL — ABNORMAL HIGH (ref 80.0–100.0)
Monocytes Absolute: 1.4 10*3/uL — ABNORMAL HIGH (ref 0.1–1.0)
Monocytes Relative: 11 %
Neutro Abs: 8.6 10*3/uL — ABNORMAL HIGH (ref 1.7–7.7)
Neutrophils Relative %: 68 %
Platelets: 73 10*3/uL — ABNORMAL LOW (ref 150–400)
RBC: 2.18 MIL/uL — ABNORMAL LOW (ref 3.87–5.11)
RDW: 21.1 % — ABNORMAL HIGH (ref 11.5–15.5)
Smear Review: NORMAL
WBC: 12.6 10*3/uL — ABNORMAL HIGH (ref 4.0–10.5)
nRBC: 0 % (ref 0.0–0.2)

## 2021-01-07 LAB — BPAM RBC
Blood Product Expiration Date: 202203132359
ISSUE DATE / TIME: 202202151314
Unit Type and Rh: 6200

## 2021-01-07 LAB — CULTURE, BLOOD (ROUTINE X 2)
Culture: NO GROWTH
Culture: NO GROWTH
Special Requests: ADEQUATE
Special Requests: ADEQUATE

## 2021-01-07 LAB — PHOSPHORUS

## 2021-01-07 NOTE — Plan of Care (Signed)

## 2021-01-07 NOTE — Progress Notes (Signed)
PROGRESS NOTE    Lindsey Huang  PYK:[id number] DOB: Jun 09, 1981 DOA: 01/02/2021 PCP: Joya Martyr, MD     Brief Narrative:  40 y.o.WF PMHx Liver Cirrhosis and Portal HTN (receiving paracentesis~q5), Hx multiple episodes of SBP, Thrombocytopenia,   Presented to the ER as he was sent by the IR clinic after having paracentesis today of 5L (Friday)  of straw-colored peritoneal fluid that showed26 monocytes, 517 neutrophils andwas cloudy. Fluid culture as well as blood culture weredrawn. The patient has been having worsening abdominal pain over the last couple days as well as increasing ascitic fluid. She has been prescribed prophylactic p.o. Cipro. Sheadmits tonauseawithoutvomiting or melena or bright red bleeding per rectum.No dysuria, oliguria or hematuria or flank pain. No chest pain or palpitations.  The patient has been getting and increasingly frequent paracentesis.  She is currently undergoing transplant evaluation at [facility].  She states she was previously hospitalized with spontaneous bacterial peritonitis at [facility].  Patient has had issues with worsening abdominal distention.  Will likely require another paracentesis per 4 prior to discharge however repeats abdominal ultrasound performed 01/04/2021 demonstrates only small volume ascites.   KUB demonstrating very large volume of stool within the colon.  Aggressive bowel regimen was initiated and was effective.  Patient's abdominal distention improving but not yet resolved.   Subjective: A/O x4, but states has had increasing difficulty concentrating, but not confused.  States on transplant list at [facility].  Decreased abdominal pain today but still some tenderness.  Continued leakage from paracentesis site.  Patient states her BP always ran somewhat low even prior to liver disease.  States her systolic BP averaged 119.   Assessment & Plan: Covid vaccination;   Active Problems:   SBP (spontaneous bacterial peritonitis)  (HCC)  Liver cirrhosis -Furosemide 60 mg daily -Rifaximin 550 mg BID -Spironolactone 100 mg daily -On [facility] list.  Spontaneous bacterial peritonitis -Per patient has had multiple episodes of SBP  -2/11 s/p paracentesis; 5 L aspirated.  Pleural fluid NGTD -Persistent abdominal distention, caused by constipation not ascites -Complete 5-day course of Rocephin for presumed SBP -As patient is still having some leakage from the paracentesis site if there is substantial fluid will consult interventional radiology for repeat paracentesis   Hyponatremia/Hypochloremia -secondary to liver failure  Thrombocytopenia  -secondary to liver failure  Hyperbilirubinemia secondary to above -secondary to liver failure  Coagulopathy  -secondary to liver failure  Constipation -Per patient lactulose increased 30 g BID, and this is now made her stool soft to runny -Furosemide 60 mg daily -Rifaximin 550 mg BID -Spironolactone 100 mg daily  Constipation -KUB demonstrating large amount of stool in the colon without evidence of ileus or obstruction Aggressive bowel regimen initiated 01/04/2021 with good result -Lactulose 30 g BID  GERD PPI     DVT prophylaxis: SCD Code Status: Full Family Communication: None today. Offered to call but the patient declined Status is: Inpatient    Dispo: The patient is from: Home              Anticipated d/c is to: Home              Anticipated d/c date is: 2/17              Patient currently unstable       Consultants:    Procedures/Significant Events:  2/11 US guided paracentesis; aspirated 5 L straw-colored fluid.    I have personally reviewed and interpreted all radiology studies and my findings are as above.  VENTILATOR SETTINGS:    Cultures 2/11 pleural fluid NGTD 2/11 blood RIGHT AC negative x2  Antimicrobials: Anti-infectives (From admission, onward)   Start     Ordered Stop   01/03/21 0700   cefTRIAXone (ROCEPHIN) 2 g in sodium chloride 0.9 % 100 mL IVPB        01/03/21 0601     01/02/21 2200  rifaximin (XIFAXAN) tablet 550 mg        01/02/21 2100     01/02/21 1930  cefTRIAXone (ROCEPHIN) 1 g in sodium chloride 0.9 % 100 mL IVPB        01/02/21 1915 01/02/21 2022       Devices    LINES / TUBES:      Continuous Infusions: . sodium chloride 250 mL (01/07/21 1017)  . cefTRIAXone (ROCEPHIN)  IV 2 g (01/07/21 1017)     Objective: Vitals:   01/06/21 1618 01/06/21 1937 01/07/21 0540 01/07/21 1141  BP: (!) 104/58 (!) 98/56 (!) 97/47 (!) 105/58  Pulse: 89 85 82 81  Resp: 18 16 18 16   Temp: 98.1 F (36.7 C) 97.9 F (36.6 C) 98.7 F (37.1 C) 98 F (36.7 C)  TempSrc:  Oral Oral   SpO2: 100% 100% 100% 100%  Weight:      Height:        Intake/Output Summary (Last 24 hours) at 01/07/2021 1143 Last data filed at 01/07/2021 1017 Gross per 24 hour  Intake 896.3 ml  Output 2000 ml  Net -1103.7 ml   Filed Weights   01/02/21 1821 01/02/21 2300  Weight: 59 kg 66 kg    Examination:  General: A/O x4, No acute respiratory distress Eyes: negative scleral hemorrhage, negative anisocoria, positive icterus ENT: Negative Runny nose, negative gingival bleeding, Neck:  Negative scars, masses, torticollis, lymphadenopathy, JVD Lungs: Clear to auscultation bilaterally without wheezes or crackles Cardiovascular: Regular rate and rhythm without murmur gallop or rub normal S1 and S2 Abdomen: positive abdominal pain,positive distended, positive soft, bowel sounds, no rebound, no ascites, no appreciable mass,positive continue clear drainage from paracentesis site RLQ Extremities: No significant cyanosis, clubbing, or edema bilateral lower extremities Skin: Negative rashes, lesions, ulcers positive jaundice Psychiatric:  Negative depression, negative anxiety, negative fatigue, negative mania  Central nervous system:  Cranial nerves II through XII intact, tongue/uvula midline, all  extremities muscle strength 5/5, sensation intact throughout, negative dysarthria, negative expressive aphasia, negative receptive aphasia.positive tremors  .     Data Reviewed: Care during the described time interval was provided by me .  I have reviewed this patient's available data, including medical history, events of note, physical examination, and all test results as part of my evaluation.  CBC: Recent Labs  Lab 01/02/21 1826 01/03/21 0521 01/05/21 0442 01/06/21 0520 01/06/21 1826 01/07/21 0531  WBC 12.9* 11.2* 10.2 10.8*  --  12.6*  NEUTROABS 9.7*  --  7.5 7.4  --  8.6*  HGB 7.7* 7.3* 6.7* 6.6* 8.7* 8.1*  HCT 22.1* 21.0* 19.2* 19.6* 25.1* 22.3*  MCV 107.3* 107.7* 108.5* 109.5*  --  102.3*  PLT 77* 69* 64* 74*  --  73*   Basic Metabolic Panel: Recent Labs  Lab 01/02/21 1826 01/03/21 0521 01/05/21 0442 01/06/21 0520 01/07/21 0531  NA 125* 126* 126* 126* 123*  K 3.9 3.9 4.1 4.7 4.8  CL 94* 96* 96* 98 95*  CO2 22 23 23 24 22   GLUCOSE 149* 119* 147* 139* 119*  BUN 17 13 15 14 13   CREATININE 0.34* 0.35* <0.30*  0.37* <0.30*  CALCIUM 8.9 8.0* 8.2* 8.4* 8.8*   GFR: CrCl cannot be calculated (This lab value cannot be used to calculate CrCl because it is not a number: <0.30). Liver Function Tests: Recent Labs  Lab 01/02/21 1826 01/03/21 0521 01/05/21 0442 01/06/21 0520 01/07/21 0531  AST 69* 61* 72* 66* 80*  ALT 34 30 35 32 36  ALKPHOS 155* 135* 115 133* 121  BILITOT 13.1* 12.3* 15.9* 12.8* 16.2*  PROT 6.6 5.4* 5.3* 5.2* 5.2*  ALBUMIN 4.0 3.2* 3.2* 3.2* 3.2*   No results for input(s): LIPASE, AMYLASE in the last 168 hours. Recent Labs  Lab 01/02/21 1826 01/04/21 1428  AMMONIA 42* 34   Coagulation Profile: Recent Labs  Lab 01/02/21 1826 01/03/21 0521  INR 2.4* 2.8*   Cardiac Enzymes: No results for input(s): CKTOTAL, CKMB, CKMBINDEX, TROPONINI in the last 168 hours. BNP (last 3 results) No results for input(s): PROBNP in the last 8760  hours. HbA1C: No results for input(s): HGBA1C in the last 72 hours. CBG: No results for input(s): GLUCAP in the last 168 hours. Lipid Profile: No results for input(s): CHOL, HDL, LDLCALC, TRIG, CHOLHDL, LDLDIRECT in the last 72 hours. Thyroid Function Tests: No results for input(s): TSH, T4TOTAL, FREET4, T3FREE, THYROIDAB in the last 72 hours. Anemia Panel: No results for input(s): VITAMINB12, FOLATE, FERRITIN, TIBC, IRON, RETICCTPCT in the last 72 hours. Sepsis Labs: Recent Labs  Lab 01/02/21 1826 01/02/21 1950  LATICACIDVEN 1.1 1.3    Recent Results (from the past 240 hour(s))  Body fluid culture     Status: None (Preliminary result)   Collection Time: 01/02/21  8:45 AM   Specimen: Ascitic; Body Fluid  Result Value Ref Range Status   Specimen Description   Final    ASCITIC Performed at [facility], [address] [place], [place] [address]    Special Requests   Final    NONE Performed at [facility], [address] [place], [place] [address]    Gram Stain   Final    FEW WBC PRESENT, PREDOMINANTLY MONONUCLEAR NO ORGANISMS SEEN    Culture   Final    NO GROWTH 2 DAYS Performed at [facility], [address] [place], [place] [address]    Report Status PENDING  Incomplete  Culture, blood (Routine x 2)     Status: None   Collection Time: 01/02/21  6:26 PM   Specimen: BLOOD  Result Value Ref Range Status   Specimen Description BLOOD RIGHT ANTECUBITAL  Final   Special Requests   Final    BOTTLES DRAWN AEROBIC AND ANAEROBIC Blood Culture adequate volume   Culture   Final    NO GROWTH 5 DAYS Performed at [facility], [address], [place], [place] [address]    Report Status 01/07/2021 FINAL  Final  Culture, blood (Routine x 2)     Status: None   Collection Time: 01/02/21  6:29 PM   Specimen: BLOOD  Result Value Ref Range Status   Specimen Description BLOOD RIGHT ANTECUBITAL  Final   Special Requests   Final    BOTTLES  DRAWN AEROBIC AND ANAEROBIC Blood Culture adequate volume   Culture   Final    NO GROWTH 5 DAYS Performed at [facility], [address], [place], [place] [address]    Report Status 01/07/2021 FINAL  Final  SARS CORONAVIRUS 2 (TAT 6-24 HRS) Nasopharyngeal Nasopharyngeal Swab     Status: None   Collection Time: 01/02/21  7:50 PM   Specimen: Nasopharyngeal Swab  Result Value Ref Range Status   SARS Coronavirus 2 NEGATIVE NEGATIVE Final    Comment: (NOTE) SARS-CoV-2 target nucleic acids are NOT DETECTED.  The SARS-CoV-2 RNA is generally detectable in upper and lower respiratory specimens during the acute phase of infection. Negative results do not preclude SARS-CoV-2 infection, do not rule out co-infections with other pathogens, and should not be used as the sole basis for treatment or other patient management decisions. Negative results must be combined with clinical observations, patient history, and epidemiological information. The expected result is Negative.  Fact Sheet for Patients: HairSlick.nohttps://www.fda.gov/media/138098/download  Fact Sheet for Healthcare Providers: quierodirigir.comhttps://www.fda.gov/media/138095/download  This test is not yet approved or cleared by the [place] FDA and  has been authorized for detection and/or diagnosis of SARS-CoV-2 by FDA under an Emergency Use Authorization (EUA). This EUA will remain  in effect (meaning this test can be used) for the duration of the COVID-19 declaration under Se ction 564(b)(1) of the Act, 21 U.S.C. section 360bbb-3(b)(1), unless the authorization is terminated or revoked sooner.  Performed at [facility], [address] [place], [place] [id number]          Radiology Studies: US ASCITES (ABDOMEN LIMITED)  Result Date: 01/06/2021 CLINICAL DATA:  Ascites EXAM: LIMITED ABDOMEN ULTRASOUND FOR ASCITES TECHNIQUE: Limited ultrasound survey for ascites was performed in all four abdominal quadrants. COMPARISON:   Ultrasound dated 01/04/2021 FINDINGS: There is a small volume of abdominal ascites. The liver appears cirrhotic. IMPRESSION: Small volume abdominal ascites. Electronically Signed   By: Katherine Mantlehristopher  Green M.D.   On: 01/06/2021 16:09        Scheduled Meds: . acetaminophen  650 mg Oral Q8H  . bisacodyl  10 mg Rectal Once  . enoxaparin (LOVENOX) injection  40 mg Subcutaneous Q24H  . ferrous sulfate  325 mg Oral Q breakfast  . folic acid  1 mg Oral Daily  . furosemide  60 mg Oral Daily  . lactulose  30 g Oral BID  . midodrine  10 mg Oral TID WC  . multivitamin with minerals  1 tablet Oral Daily  . pantoprazole  40 mg Oral Daily  . potassium chloride  10 mEq Oral Daily  . rifaximin  550 mg Oral BID  . senna-docusate  1 tablet Oral BID  . spironolactone  100 mg Oral Daily  . spironolactone  50 mg Oral Daily  . thiamine  100 mg Oral Daily   Continuous Infusions: . sodium chloride 250 mL (01/07/21 1017)  . cefTRIAXone (ROCEPHIN)  IV 2 g (01/07/21 1017)     LOS: 5 days    Time spent:40 min    Naja Apperson, Roselind MessierURTIS J, MD [facility]   If 7PM-7AM, please contact night-coverage 01/07/2021, 11:43 AM

## 2021-01-08 DIAGNOSIS — R188 Other ascites: Secondary | ICD-10-CM | POA: Diagnosis not present

## 2021-01-08 DIAGNOSIS — K652 Spontaneous bacterial peritonitis: Secondary | ICD-10-CM | POA: Diagnosis not present

## 2021-01-08 DIAGNOSIS — R140 Abdominal distension (gaseous): Secondary | ICD-10-CM | POA: Diagnosis not present

## 2021-01-08 DIAGNOSIS — K5900 Constipation, unspecified: Secondary | ICD-10-CM | POA: Diagnosis not present

## 2021-01-08 LAB — CBC WITH DIFFERENTIAL/PLATELET
Abs Immature Granulocytes: 0.96 10*3/uL — ABNORMAL HIGH (ref 0.00–0.07)
Basophils Absolute: 0.1 10*3/uL (ref 0.0–0.1)
Basophils Relative: 1 %
Eosinophils Absolute: 0.4 10*3/uL (ref 0.0–0.5)
Eosinophils Relative: 3 %
HCT: 21.4 % — ABNORMAL LOW (ref 36.0–46.0)
Hemoglobin: 7.6 g/dL — ABNORMAL LOW (ref 12.0–15.0)
Immature Granulocytes: 7 %
Lymphocytes Relative: 8 %
Lymphs Abs: 1 10*3/uL (ref 0.7–4.0)
MCH: 36.2 pg — ABNORMAL HIGH (ref 26.0–34.0)
MCHC: 35.5 g/dL (ref 30.0–36.0)
MCV: 101.9 fL — ABNORMAL HIGH (ref 80.0–100.0)
Monocytes Absolute: 1.7 10*3/uL — ABNORMAL HIGH (ref 0.1–1.0)
Monocytes Relative: 13 %
Neutro Abs: 8.9 10*3/uL — ABNORMAL HIGH (ref 1.7–7.7)
Neutrophils Relative %: 68 %
Platelets: 78 10*3/uL — ABNORMAL LOW (ref 150–400)
RBC: 2.1 MIL/uL — ABNORMAL LOW (ref 3.87–5.11)
RDW: 20.7 % — ABNORMAL HIGH (ref 11.5–15.5)
Smear Review: NORMAL
WBC: 13.1 10*3/uL — ABNORMAL HIGH (ref 4.0–10.5)
nRBC: 0 % (ref 0.0–0.2)

## 2021-01-08 LAB — BODY FLUID CULTURE W GRAM STAIN: Culture: NO GROWTH

## 2021-01-08 LAB — COMPREHENSIVE METABOLIC PANEL
ALT: 33 U/L (ref 0–44)
AST: 74 U/L — ABNORMAL HIGH (ref 15–41)
Albumin: 3.1 g/dL — ABNORMAL LOW (ref 3.5–5.0)
Alkaline Phosphatase: 135 U/L — ABNORMAL HIGH (ref 38–126)
Anion gap: 6 (ref 5–15)
BUN: 13 mg/dL (ref 6–20)
CO2: 21 mmol/L — ABNORMAL LOW (ref 22–32)
Calcium: 8.7 mg/dL — ABNORMAL LOW (ref 8.9–10.3)
Chloride: 93 mmol/L — ABNORMAL LOW (ref 98–111)
Creatinine, Ser: 0.31 mg/dL — ABNORMAL LOW (ref 0.44–1.00)
GFR, Estimated: >60 mL/min (ref >60)
Glucose, Bld: 112 mg/dL — ABNORMAL HIGH (ref 70–99)
Potassium: 5.3 mmol/L — ABNORMAL HIGH (ref 3.5–5.1)
Sodium: 120 mmol/L — ABNORMAL LOW (ref 135–145)
Total Bilirubin: 13.5 mg/dL — ABNORMAL HIGH (ref 0.3–1.2)
Total Protein: 5.2 g/dL — ABNORMAL LOW (ref 6.5–8.1)

## 2021-01-08 LAB — PHOSPHORUS: Phosphorus: UNDETERMINED mg/dL (ref 2.5–4.6)

## 2021-01-08 LAB — MAGNESIUM: Magnesium: 2.1 mg/dL (ref 1.7–2.4)

## 2021-01-08 LAB — AMMONIA: Ammonia: UNDETERMINED umol/L (ref 9–35)

## 2021-01-08 MED ORDER — ALBUMIN HUMAN 25 % IV SOLN
25.0000 g | Freq: Once | INTRAVENOUS | Status: AC
  Administered 2021-01-08: 25 g via INTRAVENOUS
  Filled 2021-01-08 (×2): qty 100

## 2021-01-08 MED ORDER — SODIUM CHLORIDE 1 G PO TABS
1.0000 g | ORAL_TABLET | Freq: Two times a day (BID) | ORAL | Status: DC
  Administered 2021-01-08: 1 g via ORAL
  Filled 2021-01-08 (×2): qty 1

## 2021-01-08 MED ORDER — TRAZODONE HCL 50 MG PO TABS
50.0000 mg | ORAL_TABLET | Freq: Every evening | ORAL | Status: DC | PRN
  Administered 2021-01-08: 50 mg via ORAL
  Filled 2021-01-08: qty 1

## 2021-01-08 NOTE — Progress Notes (Signed)
PROGRESS NOTE    Lindsey Huang  ZOX:[id number] DOB: February 09, 1981 DOA: 01/02/2021 PCP: Joya Martyr, MD     Brief Narrative:  40 y.o.WF PMHx Liver Cirrhosis and Portal HTN (receiving paracentesis~q5), Hx multiple episodes of SBP, Thrombocytopenia,   Presented to the ER as he was sent by the IR clinic after having paracentesis today of 5L (Friday)  of straw-colored peritoneal fluid that showed26 monocytes, 517 neutrophils andwas cloudy. Fluid culture as well as blood culture weredrawn. The patient has been having worsening abdominal pain over the last couple days as well as increasing ascitic fluid. She has been prescribed prophylactic p.o. Cipro. Sheadmits tonauseawithoutvomiting or melena or bright red bleeding per rectum.No dysuria, oliguria or hematuria or flank pain. No chest pain or palpitations.  The patient has been getting and increasingly frequent paracentesis.  She is currently undergoing transplant evaluation at [facility].  She states she was previously hospitalized with spontaneous bacterial peritonitis at [facility].  Patient has had issues with worsening abdominal distention.  Will likely require another paracentesis per 4 prior to discharge however repeats abdominal ultrasound performed 01/04/2021 demonstrates only small volume ascites.   KUB demonstrating very large volume of stool within the colon.  Aggressive bowel regimen was initiated and was effective.  Patient's abdominal distention improving but not yet resolved.   Subjective: 2/17 afebrile overnight, A/O x4, states much more comfortable today.  Continue drainage from previous paracentesis site.  States has an appointment with her liver specialist next week on 01/16/2021.  They have her on empiric antibiotics (ciprofloxacin) for SBP prevention    Assessment & Plan: Covid vaccination; vaccinated 3/3   Active Problems:   SBP (spontaneous bacterial peritonitis) (HCC)  Liver cirrhosis -Furosemide 60 mg  daily -Rifaximin 550 mg BID -Spironolactone 100 mg daily -On [facility] [employer] transplant [facility].  Spontaneous bacterial peritonitis -Per patient has had multiple episodes of SBP  -2/11 s/p paracentesis; 5 L aspirated.  Pleural fluid NGTD -Persistent abdominal distention, caused by constipation not ascites -Complete 5-day course of Rocephin for presumed SBP---> then transition to her home SBP prophylactic antibiotic per her liver transplant team. -As patient is still having some leakage from the paracentesis site if there is substantial fluid will consult interventional radiology for repeat paracentesis   Hyponatremia/Hypochloremia -secondary to liver failure Lab Results  Component Value Date   NA 120 (L) 01/08/2021   NA 123 (L) 01/07/2021   NA 126 (L) 01/06/2021   NA 126 (L) 01/05/2021   NA 126 (L) 01/03/2021  -Slight trend down, no change in cognition still most likely secondary to her liver failure. -Continues to trend down will start patient on NaCl tablets 1 g BID  Thrombocytopenia  -secondary to liver failure  Hyperbilirubinemia secondary to above -secondary to liver failure  Coagulopathy  -secondary to liver failure  Constipation -Per patient lactulose increased 30 g BID, and this is now made her stool soft to runny -Furosemide 60 mg daily -Rifaximin 550 mg BID -Spironolactone 100 mg daily  Constipation -KUB demonstrating large amount of stool in the colon without evidence of ileus or obstruction Aggressive bowel regimen initiated 01/04/2021 with good result -Lactulose 30 g BID  GERD PPI  Hypoalbuminemia -Secondary to liver failure -2/17 Albumin 25 g     DVT prophylaxis: SCD Code Status: Full Family Communication: None today. Offered to call but the patient declined Status is: Inpatient    Dispo: The patient is from: Home              Anticipated d/c  is to: Home              Anticipated d/c date is: 2/18              Patient currently  unstable       Consultants:    Procedures/Significant Events:  2/11 US guided paracentesis; aspirated 5 L straw-colored fluid. 2/16 KUB;Gaseous distention of the stomach.  Decreased stool burden since the prior study. Focal large volume of stool near the splenic flexure.   I have personally reviewed and interpreted all radiology studies and my findings are as above.  VENTILATOR SETTINGS:    Cultures 2/11 pleural fluid NGTD 2/11 blood RIGHT AC negative x2  Antimicrobials: Anti-infectives (From admission, onward)   Start     Ordered Stop   01/03/21 0700  cefTRIAXone (ROCEPHIN) 2 g in sodium chloride 0.9 % 100 mL IVPB        01/03/21 0601     01/02/21 2200  rifaximin (XIFAXAN) tablet 550 mg        01/02/21 2100     01/02/21 1930  cefTRIAXone (ROCEPHIN) 1 g in sodium chloride 0.9 % 100 mL IVPB        01/02/21 1915 01/02/21 2022       Devices    LINES / TUBES:      Continuous Infusions: . sodium chloride 250 mL (01/07/21 1017)  . cefTRIAXone (ROCEPHIN)  IV 2 g (01/08/21 4010)     Objective: Vitals:   01/07/21 1517 01/07/21 2112 01/07/21 2349 01/08/21 0351  BP: 110/61 (!) 98/58 (!) 101/59 105/60  Pulse: 84 84 83 87  Resp: 17 18 18 18   Temp: 97.7 F (36.5 C) 98.6 F (37 C) 97.9 F (36.6 C) 98.1 F (36.7 C)  TempSrc:      SpO2: 100% 100% 100% 100%  Weight:      Height:        Intake/Output Summary (Last 24 hours) at 01/08/2021 1020 Last data filed at 01/08/2021 01/10/2021 Gross per 24 hour  Intake 240 ml  Output 800 ml  Net -560 ml   Filed Weights   01/02/21 1821 01/02/21 2300  Weight: 59 kg 66 kg    Examination:  General: A/O x4, No acute respiratory distress Eyes: negative scleral hemorrhage, negative anisocoria, positive icterus ENT: Negative Runny nose, negative gingival bleeding, Neck:  Negative scars, masses, torticollis, lymphadenopathy, JVD Lungs: Clear to auscultation bilaterally without wheezes or crackles Cardiovascular: Regular  rate and rhythm without murmur gallop or rub normal S1 and S2 Abdomen: positive abdominal pain (decreased from 2/16),positive distended (decreased from 2/16), positive soft, bowel sounds, no rebound, no ascites, no appreciable mass,positive continue clear drainage from paracentesis site RLQ Extremities: No significant cyanosis, clubbing, or edema bilateral lower extremities Skin: Negative rashes, lesions, ulcers positive jaundice Psychiatric:  Negative depression, negative anxiety, negative fatigue, negative mania  Central nervous system:  Cranial nerves II through XII intact, tongue/uvula midline, all extremities muscle strength 5/5, sensation intact throughout, negative dysarthria, negative expressive aphasia, negative receptive aphasia.positive tremors  .     Data Reviewed: Care during the described time interval was provided by me .  I have reviewed this patient's available data, including medical history, events of note, physical examination, and all test results as part of my evaluation.  CBC: Recent Labs  Lab 01/02/21 1826 01/03/21 0521 01/05/21 0442 01/06/21 0520 01/06/21 1826 01/07/21 0531 01/08/21 0620  WBC 12.9* 11.2* 10.2 10.8*  --  12.6* 13.1*  NEUTROABS 9.7*  --  7.5  7.4  --  8.6* 8.9*  HGB 7.7* 7.3* 6.7* 6.6* 8.7* 8.1* 7.6*  HCT 22.1* 21.0* 19.2* 19.6* 25.1* 22.3* 21.4*  MCV 107.3* 107.7* 108.5* 109.5*  --  102.3* 101.9*  PLT 77* 69* 64* 74*  --  73* 78*   Basic Metabolic Panel: Recent Labs  Lab 01/03/21 0521 01/05/21 0442 01/06/21 0520 01/07/21 0531 01/08/21 0620  NA 126* 126* 126* 123* 120*  K 3.9 4.1 4.7 4.8 5.3*  CL 96* 96* 98 95* 93*  CO2 23 23 24 22  21*  GLUCOSE 119* 147* 139* 119* 112*  BUN 13 15 14 13 13   CREATININE 0.35* <0.30* 0.37* <0.30* 0.31*  CALCIUM 8.0* 8.2* 8.4* 8.8* 8.7*  MG  --   --   --   --  2.1  PHOS  --   --   --  NOT VALID UNABLE TO REPORT DUE TO ICTERUS SPECIMEN.   GFR: Estimated Creatinine Clearance: 76.4 mL/min (A) (by C-G  formula based on SCr of 0.31 mg/dL (L)). Liver Function Tests: Recent Labs  Lab 01/03/21 0521 01/05/21 0442 01/06/21 0520 01/07/21 0531 01/08/21 0620  AST 61* 72* 66* 80* 74*  ALT 30 35 32 36 33  ALKPHOS 135* 115 133* 121 135*  BILITOT 12.3* 15.9* 12.8* 16.2* 13.5*  PROT 5.4* 5.3* 5.2* 5.2* 5.2*  ALBUMIN 3.2* 3.2* 3.2* 3.2* 3.1*   No results for input(s): LIPASE, AMYLASE in the last 168 hours. Recent Labs  Lab 01/02/21 1826 01/04/21 1428 01/08/21 0620  AMMONIA 42* 34 UNABLE TO REPORT DUE TO ICTERUS SAMPLE   Coagulation Profile: Recent Labs  Lab 01/02/21 1826 01/03/21 0521  INR 2.4* 2.8*   Cardiac Enzymes: No results for input(s): CKTOTAL, CKMB, CKMBINDEX, TROPONINI in the last 168 hours. BNP (last 3 results) No results for input(s): PROBNP in the last 8760 hours. HbA1C: No results for input(s): HGBA1C in the last 72 hours. CBG: No results for input(s): GLUCAP in the last 168 hours. Lipid Profile: No results for input(s): CHOL, HDL, LDLCALC, TRIG, CHOLHDL, LDLDIRECT in the last 72 hours. Thyroid Function Tests: No results for input(s): TSH, T4TOTAL, FREET4, T3FREE, THYROIDAB in the last 72 hours. Anemia Panel: No results for input(s): VITAMINB12, FOLATE, FERRITIN, TIBC, IRON, RETICCTPCT in the last 72 hours. Sepsis Labs: Recent Labs  Lab 01/02/21 1826 01/02/21 1950  LATICACIDVEN 1.1 1.3    Recent Results (from the past 240 hour(s))  Body fluid culture     Status: None (Preliminary result)   Collection Time: 01/02/21  8:45 AM   Specimen: Ascitic; Body Fluid  Result Value Ref Range Status   Specimen Description   Final    ASCITIC Performed at [facility], [address] [place], [place] [address]    Special Requests   Final    NONE Performed at [facility], [address] [place], [place] [address]    Gram Stain   Final    FEW WBC PRESENT, PREDOMINANTLY MONONUCLEAR NO ORGANISMS SEEN    Culture   Final    NO GROWTH 2  DAYS Performed at [facility], [address] [place], [place] [address]    Report Status PENDING  Incomplete  Body fluid culture w Gram Stain     Status: None   Collection Time: 01/02/21  8:45 AM   Specimen: Ascitic  Result Value Ref Range Status   Specimen Description   Final    ASCITIC Performed at [facility], [address] [place], [place]  [address]    Special Requests   Final    NONE Performed at [facility], [address] [place], [place] [address]    Gram Stain   Final    FEW WBC PRESENT, PREDOMINANTLY MONONUCLEAR NO ORGANISMS SEEN    Culture   Final    NO GROWTH 3 DAYS Performed at [facility], [address], [place], [place] [address]    Report Status 01/06/2021 FINAL  Final  Culture, blood (Routine x 2)     Status: None   Collection Time: 01/02/21  6:26 PM   Specimen: BLOOD  Result Value Ref Range Status   Specimen Description BLOOD RIGHT ANTECUBITAL  Final   Special Requests   Final    BOTTLES DRAWN AEROBIC AND ANAEROBIC Blood Culture adequate volume   Culture   Final    NO GROWTH 5 DAYS Performed at [facility], [address] [place], [place] [address]    Report Status 01/07/2021 FINAL  Final  Culture, blood (Routine x 2)     Status: None   Collection Time: 01/02/21  6:29 PM   Specimen: BLOOD  Result Value Ref Range Status   Specimen Description BLOOD RIGHT ANTECUBITAL  Final   Special Requests   Final    BOTTLES DRAWN AEROBIC AND ANAEROBIC Blood Culture adequate volume   Culture   Final    NO GROWTH 5 DAYS Performed at [facility], [address], [place], [place] [address]    Report Status 01/07/2021 FINAL  Final  SARS CORONAVIRUS 2 (TAT 6-24 HRS) Nasopharyngeal Nasopharyngeal Swab     Status: None   Collection Time: 01/02/21  7:50 PM   Specimen: Nasopharyngeal Swab  Result Value Ref Range Status   SARS Coronavirus 2 NEGATIVE NEGATIVE Final    Comment: (NOTE) SARS-CoV-2 target  nucleic acids are NOT DETECTED.  The SARS-CoV-2 RNA is generally detectable in upper and lower respiratory specimens during the acute phase of infection. Negative results do not preclude SARS-CoV-2 infection, do not rule out co-infections with other pathogens, and should not be used as the sole basis for treatment or other patient management decisions. Negative results must be combined with clinical observations, patient history, and epidemiological information. The expected result is Negative.  Fact Sheet for Patients: HairSlick.no  Fact Sheet for Healthcare Providers: quierodirigir.com  This test is not yet approved or cleared by the [place] FDA and  has been authorized for detection and/or diagnosis of SARS-CoV-2 by FDA under an Emergency Use Authorization (EUA). This EUA will remain  in effect (meaning this test can be used) for the duration of the COVID-19 declaration under Se ction 564(b)(1) of the Act, 21 U.S.C. section 360bbb-3(b)(1), unless the authorization is terminated or revoked sooner.  Performed at [facility], [address], [place], [place] [address]          Radiology Studies: DG Abd 1 View  Result Date: 01/07/2021 CLINICAL DATA:  Constipation, EGD 11/24/2020 EXAM: ABDOMEN - 1 VIEW COMPARISON:  01/04/2021 FINDINGS: Gaseous distention of stomach. Air-filled loops of small bowel and colon without distension. Decreased stool burden. Remains stool overlying the distal transverse colon and splenic flexure. IMPRESSION: Gaseous distention of the stomach. Decreased stool burden since the prior study. Focal large volume of stool near the splenic flexure. Electronically Signed   By: Guadlupe Spanish M.D.   On: 01/07/2021 14:20   [place] ASCITES (ABDOMEN LIMITED)  Result Date: 01/06/2021 CLINICAL DATA:  Ascites EXAM: LIMITED ABDOMEN  ULTRASOUND FOR ASCITES TECHNIQUE: Limited ultrasound survey for ascites was  performed in all four abdominal quadrants. COMPARISON:  Ultrasound dated 01/04/2021 FINDINGS: There is a small volume of abdominal ascites. The liver appears cirrhotic. IMPRESSION: Small volume abdominal ascites. Electronically Signed   By: Katherine Mantle M.D.   On: 01/06/2021 16:09        Scheduled Meds: . acetaminophen  650 mg Oral Q8H  . bisacodyl  10 mg Rectal Once  . enoxaparin (LOVENOX) injection  40 mg Subcutaneous Q24H  . ferrous sulfate  325 mg Oral Q breakfast  . folic acid  1 mg Oral Daily  . furosemide  60 mg Oral Daily  . lactulose  30 g Oral BID  . midodrine  10 mg Oral TID WC  . multivitamin with minerals  1 tablet Oral Daily  . pantoprazole  40 mg Oral Daily  . potassium chloride  10 mEq Oral Daily  . rifaximin  550 mg Oral BID  . spironolactone  100 mg Oral Daily  . spironolactone  50 mg Oral Daily  . thiamine  100 mg Oral Daily   Continuous Infusions: . sodium chloride 250 mL (01/07/21 1017)  . cefTRIAXone (ROCEPHIN)  IV 2 g (01/08/21 0642)     LOS: 6 days    Time spent:40 min    Hadasa Gasner, Roselind Messier, MD [facility]   If 7PM-7AM, please contact night-coverage 01/08/2021, 10:20 AM

## 2021-01-08 NOTE — Plan of Care (Signed)

## 2021-01-09 ENCOUNTER — Ambulatory Visit: Payer: No Typology Code available for payment source

## 2021-01-09 ENCOUNTER — Inpatient Hospital Stay: Payer: Medicaid Other

## 2021-01-09 ENCOUNTER — Ambulatory Visit: Admission: RE | Admit: 2021-01-09 | Payer: No Typology Code available for payment source | Source: Ambulatory Visit

## 2021-01-09 DIAGNOSIS — E871 Hypo-osmolality and hyponatremia: Secondary | ICD-10-CM

## 2021-01-09 DIAGNOSIS — K652 Spontaneous bacterial peritonitis: Secondary | ICD-10-CM | POA: Diagnosis not present

## 2021-01-09 DIAGNOSIS — R140 Abdominal distension (gaseous): Secondary | ICD-10-CM | POA: Diagnosis not present

## 2021-01-09 DIAGNOSIS — R188 Other ascites: Secondary | ICD-10-CM | POA: Diagnosis not present

## 2021-01-09 DIAGNOSIS — K5900 Constipation, unspecified: Secondary | ICD-10-CM | POA: Diagnosis not present

## 2021-01-09 LAB — CBC WITH DIFFERENTIAL/PLATELET
Abs Immature Granulocytes: 0.96 10*3/uL — ABNORMAL HIGH (ref 0.00–0.07)
Basophils Absolute: 0.1 10*3/uL (ref 0.0–0.1)
Basophils Relative: 1 %
Eosinophils Absolute: 0.5 10*3/uL (ref 0.0–0.5)
Eosinophils Relative: 4 %
HCT: 21.9 % — ABNORMAL LOW (ref 36.0–46.0)
Hemoglobin: 7.8 g/dL — ABNORMAL LOW (ref 12.0–15.0)
Immature Granulocytes: 8 %
Lymphocytes Relative: 7 %
Lymphs Abs: 0.9 10*3/uL (ref 0.7–4.0)
MCH: 36.4 pg — ABNORMAL HIGH (ref 26.0–34.0)
MCHC: 35.6 g/dL (ref 30.0–36.0)
MCV: 102.3 fL — ABNORMAL HIGH (ref 80.0–100.0)
Monocytes Absolute: 1.3 10*3/uL — ABNORMAL HIGH (ref 0.1–1.0)
Monocytes Relative: 11 %
Neutro Abs: 8.5 10*3/uL — ABNORMAL HIGH (ref 1.7–7.7)
Neutrophils Relative %: 69 %
Platelets: 76 10*3/uL — ABNORMAL LOW (ref 150–400)
RBC: 2.14 MIL/uL — ABNORMAL LOW (ref 3.87–5.11)
RDW: 21.1 % — ABNORMAL HIGH (ref 11.5–15.5)
Smear Review: NORMAL
WBC: 12.3 10*3/uL — ABNORMAL HIGH (ref 4.0–10.5)
nRBC: 0 % (ref 0.0–0.2)

## 2021-01-09 LAB — SODIUM
Sodium: 119 mmol/L — CL (ref 135–145)
Sodium: 122 mmol/L — ABNORMAL LOW (ref 135–145)
Sodium: 121 mmol/L — ABNORMAL LOW (ref 135–145)

## 2021-01-09 LAB — MAGNESIUM: Magnesium: 2.1 mg/dL (ref 1.7–2.4)

## 2021-01-09 LAB — MRSA PCR SCREENING: MRSA by PCR: NEGATIVE

## 2021-01-09 LAB — COMPREHENSIVE METABOLIC PANEL
ALT: 32 U/L (ref 0–44)
AST: 69 U/L — ABNORMAL HIGH (ref 15–41)
Albumin: 3.3 g/dL — ABNORMAL LOW (ref 3.5–5.0)
Alkaline Phosphatase: 137 U/L — ABNORMAL HIGH (ref 38–126)
Anion gap: 5 (ref 5–15)
BUN: 17 mg/dL (ref 6–20)
CO2: 20 mmol/L — ABNORMAL LOW (ref 22–32)
Calcium: 9 mg/dL (ref 8.9–10.3)
Chloride: 94 mmol/L — ABNORMAL LOW (ref 98–111)
Creatinine, Ser: 0.33 mg/dL — ABNORMAL LOW (ref 0.44–1.00)
GFR, Estimated: >60 mL/min (ref >60)
Glucose, Bld: 140 mg/dL — ABNORMAL HIGH (ref 70–99)
Potassium: 5.3 mmol/L — ABNORMAL HIGH (ref 3.5–5.1)
Sodium: 119 mmol/L — CL (ref 135–145)
Total Bilirubin: 12.2 mg/dL — ABNORMAL HIGH (ref 0.3–1.2)
Total Protein: 5.3 g/dL — ABNORMAL LOW (ref 6.5–8.1)

## 2021-01-09 LAB — PROTEIN, PLEURAL OR PERITONEAL FLUID: Total protein, fluid: <3 g/dL

## 2021-01-09 LAB — PHOSPHORUS: Phosphorus: UNDETERMINED mg/dL (ref 2.5–4.6)

## 2021-01-09 LAB — LACTATE DEHYDROGENASE, PLEURAL OR PERITONEAL FLUID: LD, Fluid: 15 U/L (ref 3–23)

## 2021-01-09 LAB — GLUCOSE, PLEURAL OR PERITONEAL FLUID: Glucose, Fluid: 144 mg/dL

## 2021-01-09 LAB — AMMONIA: Ammonia: 70 umol/L — ABNORMAL HIGH (ref 9–35)

## 2021-01-09 MED ORDER — OXYCODONE HCL 5 MG PO TABS
10.0000 mg | ORAL_TABLET | ORAL | Status: DC | PRN
  Administered 2021-01-09 – 2021-01-16 (×10): 10 mg via ORAL
  Filled 2021-01-09 (×10): qty 2

## 2021-01-09 MED ORDER — TRAZODONE HCL 100 MG PO TABS
100.0000 mg | ORAL_TABLET | Freq: Every evening | ORAL | Status: DC | PRN
  Administered 2021-01-09: 100 mg via ORAL
  Filled 2021-01-09: qty 1

## 2021-01-09 MED ORDER — SODIUM CHLORIDE 0.9 % IV SOLN
25.0000 mg | Freq: Every day | INTRAVENOUS | Status: DC | PRN
  Administered 2021-01-15: 25 mg via INTRAVENOUS
  Filled 2021-01-09 (×4): qty 1

## 2021-01-09 MED ORDER — CHLORHEXIDINE GLUCONATE CLOTH 2 % EX PADS
6.0000 | MEDICATED_PAD | Freq: Every day | CUTANEOUS | Status: DC
  Administered 2021-01-09 – 2021-01-13 (×2): 6 via TOPICAL

## 2021-01-09 MED ORDER — SODIUM CHLORIDE 0.9 % IV SOLN
2.0000 g | INTRAVENOUS | Status: DC
  Administered 2021-01-09: 2 g via INTRAVENOUS
  Filled 2021-01-09 (×2): qty 20

## 2021-01-09 MED ORDER — ALBUMIN HUMAN 25 % IV SOLN
25.0000 g | Freq: Once | INTRAVENOUS | Status: AC
  Administered 2021-01-09: 25 g via INTRAVENOUS
  Filled 2021-01-09: qty 100

## 2021-01-09 MED ORDER — DIPHENHYDRAMINE HCL 25 MG PO CAPS
25.0000 mg | ORAL_CAPSULE | Freq: Four times a day (QID) | ORAL | Status: DC | PRN
  Administered 2021-01-10 – 2021-01-15 (×4): 25 mg via ORAL
  Filled 2021-01-09 (×3): qty 1

## 2021-01-09 MED ORDER — SODIUM CHLORIDE 1 G PO TABS
2.0000 g | ORAL_TABLET | Freq: Three times a day (TID) | ORAL | Status: DC
  Administered 2021-01-09 (×2): 2 g via ORAL
  Filled 2021-01-09 (×3): qty 2

## 2021-01-09 MED ORDER — LACTULOSE 10 GM/15ML PO SOLN
30.0000 g | Freq: Three times a day (TID) | ORAL | Status: DC
  Administered 2021-01-09 – 2021-01-13 (×11): 30 g via ORAL
  Administered 2021-01-13: 20 g via ORAL
  Administered 2021-01-14 – 2021-01-16 (×7): 30 g via ORAL
  Filled 2021-01-09 (×20): qty 60

## 2021-01-09 MED ORDER — SODIUM CHLORIDE 1 G PO TABS
2.0000 g | ORAL_TABLET | Freq: Two times a day (BID) | ORAL | Status: DC
  Filled 2021-01-09: qty 2

## 2021-01-09 NOTE — Progress Notes (Signed)
PROGRESS NOTE    Lindsey Huang  XLK:[id number] DOB: [birth year] DOA: 01/02/2021 PCP: Joya Martyr, MD     Brief Narrative:  40 y.o.WF PMHx Liver Cirrhosis and Portal HTN (receiving paracentesis~q5), Hx multiple episodes of SBP, Thrombocytopenia,   Presented to the ER as he was sent by the IR clinic after having paracentesis today of 5L (Friday)  of straw-colored peritoneal fluid that showed26 monocytes, 517 neutrophils andwas cloudy. Fluid culture as well as blood culture weredrawn. The patient has been having worsening abdominal pain over the last couple days as well as increasing ascitic fluid. She has been prescribed prophylactic p.o. Cipro. Sheadmits tonauseawithoutvomiting or melena or bright red bleeding per rectum.No dysuria, oliguria or hematuria or flank pain. No chest pain or palpitations.  The patient has been getting and increasingly frequent paracentesis.  She is currently undergoing transplant evaluation at [facility].  She states she was previously hospitalized with spontaneous bacterial peritonitis at [facility] [place].  Patient has had issues with worsening abdominal distention.  Will likely require another paracentesis per 4 prior to discharge however repeats abdominal ultrasound performed 01/04/2021 demonstrates only small volume ascites.   KUB demonstrating very large volume of stool within the colon.  Aggressive bowel regimen was initiated and was effective.  Patient's abdominal distention improving but not yet resolved.   Subjective: 2/18 A/O x4, patient states feels kind of shaky and difficult concentrating overnight.  States did not take her lactulose in the evening yesterday secondary to feeling that she would have to get up during the night to use the bathroom and wanted to have a good night sleep.    Assessment & Plan: Covid vaccination; vaccinated 3/3   Active Problems:   SBP (spontaneous bacterial peritonitis) (HCC)  Liver cirrhosis -Furosemide 60  mg daily -Rifaximin 550 mg BID -2/18 Spironolactone 100 mg daily (held) secondary to hyperkalemia -On [facility] transplant list. -2/18 Dr. Valeta [facility] has accepted patient for transfer to [facility].  Will call daily for updates  Spontaneous bacterial peritonitis -Per patient has had multiple episodes of SBP  -2/11 s/p paracentesis; 5 L aspirated.  Pleural fluid NGTD -Persistent abdominal distention, caused by constipation not ascites -Complete 5-day course of Rocephin for presumed SBP---> then transition to her home SBP prophylactic antibiotic per her liver transplant team. -As patient is still having some leakage from the paracentesis site if there is substantial fluid will consult interventional radiology for repeat paracentesis -2/18 continue ceftriaxone for presumed SBP while hospitalized per liver transplant team at [facility].  Upon discharge Bactrim for SBP prophylaxis  Hyponatremia/Hypochloremia -secondary to liver failure Lab Results  Component Value Date   NA 119 (LL) 01/09/2021   NA 119 (LL) 01/09/2021   NA 120 (L) 01/08/2021   NA 123 (L) 01/07/2021   NA 126 (L) 01/06/2021  -Slight trend down, no change in cognition still most likely secondary to her liver failure. -2/18 spoke with Dr. Allena Katz on liver transplant team at [facility] recommended following;  -discontinuing  NaCl tablets, as they do not work in liver failure patients well.   -Discontinue all diuretics  -Decreasing fluid restriction to 1540ml/day  -Frequent monitoring of Na, will monitor QID  -If no improvement in Na renal consult with initiation of Tolvaptan  Thrombocytopenia  -secondary to liver failure  Hyperbilirubinemia secondary to above -secondary to liver failure  Coagulopathy  -secondary to liver failure  Constipation/elevated Ammonia -KUB demonstrating large amount of stool in the colon without evidence of ileus or obstruction Aggressive bowel regimen  initiated  01/04/2021 with good result Lab Results  Component Value Date   AMMONIA 70 (H) 01/09/2021   AMMONIA UNABLE TO REPORT DUE TO ICTERUS SAMPLE 01/08/2021   AMMONIA 34 01/04/2021   AMMONIA 42 (H) 01/02/2021   AMMONIA 41 (H) 10/09/2020  -2/18 increase Lactulose 30 g TID  GERD PPI  Hypoalbuminemia -Secondary to liver failure -2/17 Albumin 25 g     DVT prophylaxis: SCD Code Status: Full Family Communication: None today. Offered to call but the patient declined Status is: Inpatient    Dispo: The patient is from: Home              Anticipated d/c is to: Home              Anticipated d/c date is: ??              Patient currently unstable       Consultants:  [facility] liver [facility]   Procedures/Significant Events:  2/11 US guided paracentesis; aspirated 5 L straw-colored fluid. 2/16 KUB;Gaseous distention of the stomach. -Decreased stool burden since the prior study. Focal large volume of stool near the splenic flexure. 2/18 US guided paracentesis; aspirated 4 L fluid   I have personally reviewed and interpreted all radiology studies and my findings are as above.  VENTILATOR SETTINGS:    Cultures 2/11 pleural fluid NGTD 2/11 blood RIGHT AC negative x2  Antimicrobials: Anti-infectives (From admission, onward)   Start     Ordered Stop   01/03/21 0700  cefTRIAXone (ROCEPHIN) 2 g in sodium chloride 0.9 % 100 mL IVPB        01/03/21 0601     01/02/21 2200  rifaximin (XIFAXAN) tablet 550 mg        01/02/21 2100     01/02/21 1930  cefTRIAXone (ROCEPHIN) 1 g in sodium chloride 0.9 % 100 mL IVPB        01/02/21 1915 01/02/21 2022       Devices    LINES / TUBES:      Continuous Infusions: . sodium chloride Stopped (01/07/21 1250)     Objective: Vitals:   01/08/21 0351 01/08/21 1115 01/08/21 2038 01/09/21 0410  BP: 105/60 (!) 98/57 99/60 (!) 107/58  Pulse: 87 89 86 96  Resp: 18 18 16 14   Temp: 98.1 F (36.7 C) 98.2 F  (36.8 C) 98 F (36.7 C) 98.1 F (36.7 C)  TempSrc:  Oral Oral Oral  SpO2: 100% 100% 100% 99%  Weight:      Height:        Intake/Output Summary (Last 24 hours) at 01/09/2021 [id number] Last data filed at 01/08/2021 1843 Gross per 24 hour  Intake 700.16 ml  Output 0 ml  Net 700.16 ml   Filed Weights   01/02/21 1821 01/02/21 2300  Weight: 59 kg 66 kg    Examination:  General: A/O x4, No acute respiratory distress Eyes: negative scleral hemorrhage, negative anisocoria, positive icterus ENT: Negative Runny nose, negative gingival bleeding, Neck:  Negative scars, masses, torticollis, lymphadenopathy, JVD Lungs: Clear to auscultation bilaterally without wheezes or crackles Cardiovascular: Regular rate and rhythm without murmur gallop or rub normal S1 and S2 Abdomen: positive abdominal pain (decreased from 2/16),positive distended (decreased from 2/16), positive soft, bowel sounds, no rebound, no ascites, no appreciable mass,positive continue clear drainage from paracentesis site RLQ Extremities: No significant cyanosis, clubbing, or edema bilateral lower extremities Skin: Negative rashes, lesions, ulcers positive jaundice Psychiatric:  Negative depression, negative anxiety,  negative fatigue, negative mania  Central nervous system:  Cranial nerves II through XII intact, tongue/uvula midline, all extremities muscle strength 5/5, sensation intact throughout, negative dysarthria, negative expressive aphasia, negative receptive aphasia.positive tremors  .     Data Reviewed: Care during the described time interval was provided by me .  I have reviewed this patient's available data, including medical history, events of note, physical examination, and all test results as part of my evaluation.  CBC: Recent Labs  Lab 01/05/21 0442 01/06/21 0520 01/06/21 1826 01/07/21 0531 01/08/21 0620 01/09/21 0516  WBC 10.2 10.8*  --  12.6* 13.1* 12.3*  NEUTROABS 7.5 7.4  --  8.6* 8.9* 8.5*  HGB 6.7*  6.6* 8.7* 8.1* 7.6* 7.8*  HCT 19.2* 19.6* 25.1* 22.3* 21.4* 21.9*  MCV 108.5* 109.5*  --  102.3* 101.9* 102.3*  PLT 64* 74*  --  73* 78* 76*   Basic Metabolic Panel: Recent Labs  Lab 01/05/21 0442 01/06/21 0520 01/07/21 0531 01/08/21 0620 01/09/21 0516  NA 126* 126* 123* 120* 119*  K 4.1 4.7 4.8 5.3* 5.3*  CL 96* 98 95* 93* 94*  CO2 23 24 22  21* 20*  GLUCOSE 147* 139* 119* 112* 140*  BUN 15 14 13 13 17   CREATININE <0.30* 0.37* <0.30* 0.31* 0.33*  CALCIUM 8.2* 8.4* 8.8* 8.7* 9.0  MG  --   --   --  2.1 2.1  PHOS  --   --  NOT VALID UNABLE TO REPORT DUE TO ICTERUS SPECIMEN.  UNABLE TO RESULT DUE TO ICTERUS    GFR: Estimated Creatinine Clearance: 76.4 mL/min (A) (by C-G formula based on SCr of 0.33 mg/dL (L)). Liver Function Tests: Recent Labs  Lab 01/05/21 0442 01/06/21 0520 01/07/21 0531 01/08/21 0620 01/09/21 0516  AST 72* 66* 80* 74* 69*  ALT 35 32 36 33 32  ALKPHOS 115 133* 121 135* 137*  BILITOT 15.9* 12.8* 16.2* 13.5* 12.2*  PROT 5.3* 5.2* 5.2* 5.2* 5.3*  ALBUMIN 3.2* 3.2* 3.2* 3.1* 3.3*   No results for input(s): LIPASE, AMYLASE in the last 168 hours. Recent Labs  Lab 01/02/21 1826 01/04/21 1428 01/08/21 0620 01/09/21 0516  AMMONIA 42* 34 UNABLE TO REPORT DUE TO ICTERUS SAMPLE 70*   Coagulation Profile: Recent Labs  Lab 01/02/21 1826 01/03/21 0521  INR 2.4* 2.8*   Cardiac Enzymes: No results for input(s): CKTOTAL, CKMB, CKMBINDEX, TROPONINI in the last 168 hours. BNP (last 3 results) No results for input(s): PROBNP in the last 8760 hours. HbA1C: No results for input(s): HGBA1C in the last 72 hours. CBG: No results for input(s): GLUCAP in the last 168 hours. Lipid Profile: No results for input(s): CHOL, HDL, LDLCALC, TRIG, CHOLHDL, LDLDIRECT in the last 72 hours. Thyroid Function Tests: No results for input(s): TSH, T4TOTAL, FREET4, T3FREE, THYROIDAB in the last 72 hours. Anemia Panel: No results for input(s): VITAMINB12, FOLATE, FERRITIN,  TIBC, IRON, RETICCTPCT in the last 72 hours. Sepsis Labs: Recent Labs  Lab 01/02/21 1826 01/02/21 1950  LATICACIDVEN 1.1 1.3    Recent Results (from the past 240 hour(s))  Body fluid culture     Status: None (Preliminary result)   Collection Time: 01/02/21  8:45 AM   Specimen: Ascitic; Body Fluid  Result Value Ref Range Status   Specimen Description   Final    ASCITIC Performed at [facility], [address] [place], [address] [place]    Special Requests   Final    NONE Performed at [facility], [address] [place], [place] [address]    Gram Stain   Final    FEW WBC PRESENT, PREDOMINANTLY MONONUCLEAR NO ORGANISMS SEEN    Culture   Final    NO GROWTH 2 DAYS Performed at [facility], [address], [place], [place] [address]    Report Status PENDING  Incomplete  Body fluid culture w Gram Stain     Status: None   Collection Time: 01/02/21  8:45 AM   Specimen: Ascitic  Result Value Ref Range Status   Specimen Description   Final    ASCITIC Performed at [facility], [address], [place], [place] [address]    Special Requests   Final    NONE Performed at [facility], [address] [place], [place] [address]    Gram Stain   Final    FEW WBC PRESENT, PREDOMINANTLY MONONUCLEAR NO ORGANISMS SEEN    Culture   Final    NO GROWTH 3 DAYS Performed at [facility], [address], [place], [place] [address]    Report Status 01/06/2021 FINAL  Final  Culture, blood (Routine x 2)     Status: None   Collection Time: 01/02/21  6:26 PM   Specimen: BLOOD  Result Value Ref Range Status   Specimen Description BLOOD RIGHT ANTECUBITAL  Final   Special Requests   Final    BOTTLES DRAWN AEROBIC AND ANAEROBIC Blood Culture adequate volume   Culture   Final    NO GROWTH 5 DAYS Performed at [facility], [address], [place], [place] [address]    Report Status 01/07/2021 FINAL  Final  Culture, blood  (Routine x 2)     Status: None   Collection Time: 01/02/21  6:29 PM   Specimen: BLOOD  Result Value Ref Range Status   Specimen Description BLOOD RIGHT ANTECUBITAL  Final   Special Requests   Final    BOTTLES DRAWN AEROBIC AND ANAEROBIC Blood Culture adequate volume   Culture   Final    NO GROWTH 5 DAYS Performed at [facility], [address] [place], [place] [address]    Report Status 01/07/2021 FINAL  Final  SARS CORONAVIRUS 2 (TAT 6-24 HRS) Nasopharyngeal Nasopharyngeal Swab     Status: None   Collection Time: 01/02/21  7:50 PM   Specimen: Nasopharyngeal Swab  Result Value Ref Range Status   SARS Coronavirus 2 NEGATIVE NEGATIVE Final    Comment: (NOTE) SARS-CoV-2 target nucleic acids are NOT DETECTED.  The SARS-CoV-2 RNA is generally detectable in upper and lower respiratory specimens during the acute phase of infection. Negative results do not preclude SARS-CoV-2 infection, do not rule out co-infections with other pathogens, and should not be used as the sole basis for treatment or other patient management decisions. Negative results must be combined with clinical observations, patient history, and epidemiological information. The expected result is Negative.  Fact Sheet for Patients: HairSlick.no  Fact Sheet for Healthcare Providers: quierodirigir.com  This test is not yet approved or cleared by the [place] FDA and  has been authorized for detection and/or diagnosis of SARS-CoV-2 by FDA under an Emergency Use Authorization (EUA). This EUA will remain  in effect (meaning this test can be used) for the duration of the COVID-19 declaration under Se ction 564(b)(1) of the Act, 21 U.S.C. section 360bbb-3(b)(1), unless the authorization is terminated or revoked sooner.  Performed at [facility], [address], [place], [place] [address]  Radiology Studies: DG Abd 1  View  Result Date: 01/07/2021 CLINICAL DATA:  Constipation, EGD 11/24/2020 EXAM: ABDOMEN - 1 VIEW COMPARISON:  01/04/2021 FINDINGS: Gaseous distention of stomach. Air-filled loops of small bowel and colon without distension. Decreased stool burden. Remains stool overlying the distal transverse colon and splenic flexure. IMPRESSION: Gaseous distention of the stomach. Decreased stool burden since the prior study. Focal large volume of stool near the splenic flexure. Electronically Signed   By: Guadlupe Spanish M.D.   On: 01/07/2021 14:20        Scheduled Meds: . acetaminophen  650 mg Oral Q8H  . bisacodyl  10 mg Rectal Once  . ferrous sulfate  325 mg Oral Q breakfast  . folic acid  1 mg Oral Daily  . furosemide  60 mg Oral Daily  . lactulose  30 g Oral BID  . midodrine  10 mg Oral TID WC  . multivitamin with minerals  1 tablet Oral Daily  . pantoprazole  40 mg Oral Daily  . potassium chloride  10 mEq Oral Daily  . rifaximin  550 mg Oral BID  . sodium chloride  2 g Oral BID WC  . spironolactone  100 mg Oral Daily  . spironolactone  50 mg Oral Daily  . thiamine  100 mg Oral Daily   Continuous Infusions: . sodium chloride Stopped (01/07/21 1250)     LOS: 7 days    Time spent:40 min    Crews Mccollam, Roselind Messier, MD [facility]   If 7PM-7AM, please contact night-coverage 01/09/2021, 8:28 AM

## 2021-01-09 NOTE — Procedures (Signed)
PROCEDURE SUMMARY:  Successful US guided paracentesis from right abdomen.  Yielded 4 L of Hazy Yellow fluid.  No immediate complications.  Pt tolerated well.   Specimen sent for labs.  EBL < 1 mL  Mickie Kay, NP 01/09/2021 12:01 PM

## 2021-01-09 NOTE — Progress Notes (Signed)
Received call regarding critical lab, sodium 119.  MD aware.

## 2021-01-09 NOTE — Progress Notes (Signed)
Dr. Para March also notified, via secure chat regarding labs. Daekwon Beswick K, RN;01/09/2021; 6:20 AM

## 2021-01-09 NOTE — Progress Notes (Signed)
Lab called with Critical Na+ 119; Ammonia 70 and that they would not be able to give a Phosphorus d/t bilirubin interfering; Care Nurse, Kai Levins, RN notified. Windy Carina, RN 01/09/2021; 6:15 AM

## 2021-01-10 ENCOUNTER — Encounter: Payer: Self-pay | Admitting: Family Medicine

## 2021-01-10 DIAGNOSIS — K652 Spontaneous bacterial peritonitis: Secondary | ICD-10-CM | POA: Diagnosis not present

## 2021-01-10 DIAGNOSIS — K7200 Acute and subacute hepatic failure without coma: Secondary | ICD-10-CM

## 2021-01-10 LAB — BODY FLUID CELL COUNT WITH DIFFERENTIAL
Eos, Fluid: 0 %
Lymphs, Fluid: 11 %
Monocyte-Macrophage-Serous Fluid: 32 %
Neutrophil Count, Fluid: 57 %
Other Cells, Fluid: 0 %
Total Nucleated Cell Count, Fluid: 82 cu mm

## 2021-01-10 LAB — CBC WITH DIFFERENTIAL/PLATELET
Abs Immature Granulocytes: 0.8 10*3/uL — ABNORMAL HIGH (ref 0.00–0.07)
Basophils Absolute: 0.1 10*3/uL (ref 0.0–0.1)
Basophils Relative: 1 %
Eosinophils Absolute: 0.4 10*3/uL (ref 0.0–0.5)
Eosinophils Relative: 3 %
HCT: 19.8 % — ABNORMAL LOW (ref 36.0–46.0)
Hemoglobin: 6.9 g/dL — ABNORMAL LOW (ref 12.0–15.0)
Immature Granulocytes: 7 %
Lymphocytes Relative: 7 %
Lymphs Abs: 0.8 10*3/uL (ref 0.7–4.0)
MCH: 36.7 pg — ABNORMAL HIGH (ref 26.0–34.0)
MCHC: 34.8 g/dL (ref 30.0–36.0)
MCV: 105.3 fL — ABNORMAL HIGH (ref 80.0–100.0)
Monocytes Absolute: 1.5 10*3/uL — ABNORMAL HIGH (ref 0.1–1.0)
Monocytes Relative: 13 %
Neutro Abs: 8.3 10*3/uL — ABNORMAL HIGH (ref 1.7–7.7)
Neutrophils Relative %: 69 %
Platelets: 69 10*3/uL — ABNORMAL LOW (ref 150–400)
RBC: 1.88 MIL/uL — ABNORMAL LOW (ref 3.87–5.11)
RDW: 21.3 % — ABNORMAL HIGH (ref 11.5–15.5)
WBC: 11.8 10*3/uL — ABNORMAL HIGH (ref 4.0–10.5)
nRBC: 0 % (ref 0.0–0.2)

## 2021-01-10 LAB — SODIUM
Sodium: 119 mmol/L — CL (ref 135–145)
Sodium: 118 mmol/L — CL (ref 135–145)
Sodium: 119 mmol/L — CL (ref 135–145)

## 2021-01-10 LAB — COMPREHENSIVE METABOLIC PANEL
ALT: 34 U/L (ref 0–44)
AST: 69 U/L — ABNORMAL HIGH (ref 15–41)
Albumin: 3.5 g/dL (ref 3.5–5.0)
Alkaline Phosphatase: 113 U/L (ref 38–126)
Anion gap: 9 (ref 5–15)
BUN: 23 mg/dL — ABNORMAL HIGH (ref 6–20)
CO2: 20 mmol/L — ABNORMAL LOW (ref 22–32)
Calcium: 9.2 mg/dL (ref 8.9–10.3)
Chloride: 92 mmol/L — ABNORMAL LOW (ref 98–111)
Creatinine, Ser: 0.38 mg/dL — ABNORMAL LOW (ref 0.44–1.00)
GFR, Estimated: >60 mL/min (ref >60)
Glucose, Bld: 128 mg/dL — ABNORMAL HIGH (ref 70–99)
Potassium: 4.8 mmol/L (ref 3.5–5.1)
Sodium: 121 mmol/L — ABNORMAL LOW (ref 135–145)
Total Bilirubin: 15 mg/dL — ABNORMAL HIGH (ref 0.3–1.2)
Total Protein: 5.4 g/dL — ABNORMAL LOW (ref 6.5–8.1)

## 2021-01-10 LAB — PREPARE RBC (CROSSMATCH)

## 2021-01-10 LAB — MAGNESIUM: Magnesium: 2.2 mg/dL (ref 1.7–2.4)

## 2021-01-10 LAB — PHOSPHORUS: Phosphorus: UNDETERMINED mg/dL (ref 2.5–4.6)

## 2021-01-10 LAB — AMMONIA: Ammonia: 46 umol/L — ABNORMAL HIGH (ref 9–35)

## 2021-01-10 MED ORDER — NOREPINEPHRINE 4 MG/250ML-% IV SOLN
2.0000 ug/min | INTRAVENOUS | Status: DC
  Administered 2021-01-10: 2 ug/min via INTRAVENOUS
  Filled 2021-01-10: qty 250

## 2021-01-10 MED ORDER — FOLIC ACID 1 MG PO TABS: 1.0000 mg | ORAL_TABLET | Freq: Every day | ORAL | Status: AC

## 2021-01-10 MED ORDER — TRAZODONE HCL 100 MG PO TABS: 100.0000 mg | ORAL_TABLET | Freq: Every evening | ORAL | Status: AC | PRN

## 2021-01-10 MED ORDER — ENSURE ENLIVE PO LIQD: 237.0000 mL | Freq: Two times a day (BID) | ORAL | 12 refills | Status: AC

## 2021-01-10 MED ORDER — RIFAXIMIN 550 MG PO TABS: 550.0000 mg | ORAL_TABLET | Freq: Two times a day (BID) | ORAL | 2 refills | Status: AC

## 2021-01-10 MED ORDER — SODIUM CHLORIDE 0.9% IV SOLUTION: Freq: Once | INTRAVENOUS | Status: AC

## 2021-01-10 MED ORDER — OXYCODONE HCL 10 MG PO TABS
10.0000 mg | ORAL_TABLET | ORAL | 0 refills | Status: AC | PRN
Start: 2021-01-10 — End: ?

## 2021-01-10 MED ORDER — NOREPINEPHRINE 4 MG/250ML-% IV SOLN: 2.0000 ug/min | INTRAVENOUS | Status: AC

## 2021-01-10 MED ORDER — ONDANSETRON HCL 4 MG/2ML IJ SOLN: 4.0000 mg | Freq: Four times a day (QID) | INTRAMUSCULAR | 0 refills | Status: AC | PRN

## 2021-01-10 MED ORDER — ALBUMIN HUMAN 25 % IV SOLN
25.0000 g | Freq: Four times a day (QID) | INTRAVENOUS | Status: AC
  Administered 2021-01-10 (×2): 25 g via INTRAVENOUS
  Filled 2021-01-10 (×2): qty 100

## 2021-01-10 MED ORDER — ONDANSETRON HCL 4 MG PO TABS: 4.0000 mg | ORAL_TABLET | Freq: Four times a day (QID) | ORAL | 0 refills | Status: AC | PRN

## 2021-01-10 MED ORDER — PIPERACILLIN-TAZOBACTAM 3.375 G IVPB
3.3750 g | Freq: Three times a day (TID) | INTRAVENOUS | Status: AC
  Administered 2021-01-10 – 2021-01-14 (×13): 3.375 g via INTRAVENOUS
  Filled 2021-01-10 (×14): qty 50

## 2021-01-10 MED ORDER — ENSURE ENLIVE PO LIQD
237.0000 mL | Freq: Two times a day (BID) | ORAL | Status: DC
  Administered 2021-01-10 – 2021-01-16 (×6): 237 mL via ORAL

## 2021-01-10 MED ORDER — ALBUMIN HUMAN 25 % IV SOLN: 25.0000 g | Freq: Four times a day (QID) | INTRAVENOUS | Status: AC

## 2021-01-10 MED ORDER — PIPERACILLIN-TAZOBACTAM 3.375 G IVPB: 3.3750 g | Freq: Three times a day (TID) | INTRAVENOUS | Status: AC

## 2021-01-10 MED ORDER — SODIUM CHLORIDE 0.9 % IV SOLN: 250.0000 mL | INTRAVENOUS | Status: DC

## 2021-01-10 MED ORDER — FUROSEMIDE 20 MG PO TABS
20.0000 mg | ORAL_TABLET | Freq: Every day | ORAL | Status: DC
  Filled 2021-01-10: qty 1

## 2021-01-10 MED ORDER — MIDODRINE HCL 10 MG PO TABS: 10.0000 mg | ORAL_TABLET | Freq: Three times a day (TID) | ORAL | Status: AC

## 2021-01-10 NOTE — Progress Notes (Signed)
Physician Discharge Summary  Lindsey Huang AVW:[id number] DOB: Feb 12, 1981 DOA: 01/02/2021  PCP: Joya Martyr, MD  Admit date: 01/02/2021 Discharge date: 01/10/2021  Admitted From: Home  Disposition:  Transfer to [facility]     Discharge Condition: Deteriorating  CODE STATUS: FULL Diet recommendation: Low sodium, fluid restricted       Brief/Interim Summary: Lindsey Huang is a 40 y.o. 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  Has periodic outpatient paracentesis.  On day before admission, had an outpatient para, showed 517 neutrophils, sent to ER.   2/12: admitted, started on Rocephin 2/13 - 2/18: patient had persistent abdominal distension, unclear if constipation or ascites  -completed 5 days CTX and constipation resolved   -but still symptomatic, increasingly encephalopathic 2/18 repeat US paracentesis with 4L fluid  -restarted IV antibiotics  -BP trending down to 80s/40s -> transferred to stepdown unit for levophed  -Accepted to [facility] for transfer        PRINCIPAL HOSPITAL DIAGNOSIS: Decompensated liver cirrhosis.       Assessment & Plan:  Decompensated cirrhosis Alcoholic cirrhosis with ascites/portal hypertension, encephalopathy, thrombocytopenia Patient initially admitted and started on Rocephin for SBP on 2/12.  Had progressive weakness, malaise, abdominal distension/pain, and then confusion.  Due to progressive abdominal distension, had repeat paracentesis on 2/18.  After second paracentesis, patient got albumin, but mentation worsened, BP drifted down to 80s/40s and so Levophed was started peripherally and she was transferred to [facility].  No new fever.  Blood cultures drawn and antibiotics broadened to Zosyn.    Cell count added on to fluid from repeat paracentesis on 2/18 showed nucleated cells down to 82 from 517/mm3.  MELD 33. Hypotension.  Worsening AMS last 48 hours.  Discussed  with Dr. 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  Treated with 5 days Rocephin.  Deteriorating in last 48 hours, so Rocephin restarted, then broadened to Zosyn today.  Paracentesis 2/18 with only 82 nuc cells/mm3, gram stain negative. -Continue Zosyn  Hyponatremia, hypochloremia, and coagulopathy due to cirrhosis Worsening due to worsening liver failure -Check INR             Discharge Instructions   Allergies as of 01/10/2021   No Known Allergies     Medication List    TAKE these medications   albumin human 25 % bottle Inject 100 mLs (25 g total) into the vein every 6 (six) hours.   feeding supplement Liqd Take 237 mLs by mouth 2 (two) times daily between meals.   ferrous sulfate 325 (65 FE) MG tablet Take 1 tablet (325 mg total) by mouth daily with breakfast.   folic acid 1 MG tablet Commonly known as: FOLVITE Take 1 tablet (1 mg total) by mouth daily. Start taking on: January 11, 2021   furosemide 20 MG tablet Commonly known as: LASIX Take 3 tablets (60 mg total) by mouth daily.   lactulose 10 GM/15ML solution Commonly known  as: CHRONULAC Take 15 mLs (10 g total) by mouth 2 (two) times daily.   midodrine 10 MG tablet Commonly known as: PROAMATINE Take 1 tablet (10 mg total) by mouth 3 (three) times daily with meals.   multivitamin with  minerals Tabs tablet Take 1 tablet by mouth daily.   norepinephrine 4-5 MG/250ML-% Soln Commonly known as: LEVOPHED Inject 2-10 mcg/min into the vein continuous.   ondansetron 4 MG tablet Commonly known as: ZOFRAN Take 1 tablet (4 mg total) by mouth every 6 (six) hours as needed for nausea.   ondansetron 4 MG/2ML Soln injection Commonly known as: ZOFRAN Inject 2 mLs (4 mg total) into the vein every 6 (six) hours as needed for nausea.   Oxycodone HCl 10 MG Tabs Take 1 tablet (10 mg total) by mouth every 4 (four) hours as needed for moderate pain.   pantoprazole 40 MG tablet Commonly known as: PROTONIX Take 1 tablet (40 mg total) by mouth daily.   piperacillin-tazobactam 3.375 GM/50ML IVPB Commonly known as: ZOSYN Inject 50 mLs (3.375 g total) into the vein every 8 (eight) hours.   potassium chloride 10 MEQ tablet Commonly known as: KLOR-CON Take 1 tablet (10 mEq total) by mouth daily.   rifaximin 550 MG Tabs tablet Commonly known as: XIFAXAN Take 1 tablet (550 mg total) by mouth 2 (two) times daily.   spironolactone 50 MG tablet Commonly known as: ALDACTONE Take 3 tablets (150 mg total) by mouth daily. What changed: how much to take   thiamine 100 MG tablet Take 100 mg by mouth daily.   traZODone 100 MG tablet Commonly known as: DESYREL Take 1 tablet (100 mg total) by mouth at bedtime as needed for sleep.   vitamin B-12 1000 MCG tablet Commonly known as: CYANOCOBALAMIN Take 1,000 mcg by mouth daily.       No Known Allergies     Procedures/Studies: DG Chest 2 View  Result Date: 01/02/2021 CLINICAL DATA:  Suspected sepsis, paracentesis morning with reported infection EXAM: CHEST - 2 VIEW COMPARISON:  Radiograph 11/23/2020 FINDINGS: No consolidation, features of edema, pneumothorax, or effusion. Pulmonary vascularity is normally distributed. The cardiomediastinal contours are unremarkable. No acute osseous or soft tissue abnormality. IMPRESSION: No acute  cardiopulmonary abnormality. Electronically Signed   By: Kreg Shropshire M.D.   On: 01/02/2021 19:01   DG Abd 1 View  Result Date: 01/07/2021 CLINICAL DATA:  Constipation, EGD 11/24/2020 EXAM: ABDOMEN - 1 VIEW COMPARISON:  01/04/2021 FINDINGS: Gaseous distention of stomach. Air-filled loops of small bowel and colon without distension. Decreased stool burden. Remains stool overlying the distal transverse colon and splenic flexure. IMPRESSION: Gaseous distention of the stomach. Decreased stool burden since the prior study. Focal large volume of stool near the splenic flexure. Electronically Signed   By: Guadlupe Spanish M.D.   On: 01/07/2021 14:20   DG Abd 1 View  Result Date: 01/04/2021 CLINICAL DATA:  Constipation EXAM: ABDOMEN - 1 VIEW COMPARISON:  None. FINDINGS: The bowel gas pattern is nonobstructive. There is a very large amount of stool throughout the colon. There is no visualized acute osseous abnormality. No radiopaque kidney stones. IMPRESSION: Very large amount of stool throughout the colon. No evidence of bowel obstruction. Electronically Signed   By: Katherine Mantle M.D.   On: 01/04/2021 19:44   US Abdomen Limited  Result Date: 01/04/2021 CLINICAL DATA:  Evaluate ascites. EXAM: LIMITED ABDOMEN ULTRASOUND FOR ASCITES TECHNIQUE: Limited ultrasound survey for ascites was performed in all four abdominal quadrants. COMPARISON:  December 11, 2020. FINDINGS: Small ascites, mostly in the left lower quadrant. IMPRESSION: 1. Small ascites, not enough for paracentesis. Electronically Signed   By: Obie Dredge M.D.   On: 01/04/2021 11:39   US Paracentesis  Result Date: 01/09/2021 INDICATION: Patient with a history of alcoholic cirrhosis and recurrent large volume ascites. Request is for a therapeutic and diagnostic paracentesis up to 4 L. EXAM: ULTRASOUND GUIDED PARACENTESIS MEDICATIONS: 1% lidocaine 10 mL COMPLICATIONS: None immediate. PROCEDURE: Informed written consent was obtained from the  patient after a discussion of the risks, benefits and alternatives to treatment. A timeout was performed prior to the initiation of the procedure. Initial ultrasound scanning demonstrates a large amount of ascites within the right lower abdominal quadrant. The right lower abdomen was prepped and draped in the usual sterile fashion. 1% lidocaine was used for local anesthesia. Following this, a 19 gauge, 7-cm, Yueh catheter was introduced. An ultrasound image was saved for documentation purposes. The paracentesis was performed. The catheter was removed and a dressing was applied. The patient tolerated the procedure well without immediate post procedural complication. FINDINGS: A total of approximately 4 L of hazy yellow fluid was removed. Samples were sent to the laboratory as requested by the clinical team. IMPRESSION: Successful ultrasound-guided paracentesis yielding 4 liters of peritoneal fluid. Read by: Alwyn Ren, NP Electronically Signed   By: Judie Petit.  Shick M.D.   On: 01/09/2021 12:03   US Paracentesis  Result Date: 01/02/2021 INDICATION: Patient with history of alcoholic cirrhosis with recurrent ascites. Request is for therapeutic and diagnostic paracentesis with a maximum of 5 L EXAM: ULTRASOUND GUIDED THERAPEUTIC AND DIAGNOSTIC PARACENTESIS MEDICATIONS: Lidocaine 1% 10 mL COMPLICATIONS: None immediate. PROCEDURE: Informed written consent was obtained from the patient after a discussion of the risks, benefits and alternatives to treatment. A timeout was performed prior to the initiation of the procedure. Initial ultrasound scanning demonstrates a large amount of ascites within the right lower abdominal quadrant. The right lower abdomen was prepped and draped in the usual sterile fashion. 1% lidocaine was used for local anesthesia. Following this, a 19 gauge, 7-cm, Yueh catheter was introduced. An ultrasound image was saved for documentation purposes. The paracentesis was performed. The catheter was  removed and a dressing was applied. The patient tolerated the procedure well without immediate post procedural complication. Patient received post-procedure intravenous albumin; see nursing notes for details. FINDINGS: A total of approximately 5 L of straw-colored fluid was removed. Samples were sent to the laboratory as requested by the clinical team. IMPRESSION: Successful ultrasound-guided therapeutic and diagnostic paracentesis yielding 5 liters of peritoneal fluid. Read by: Anders Grant, NP Electronically Signed   By: Simonne Come M.D.   On: 01/02/2021 10:09   US Paracentesis  Result Date: 12/25/2020 INDICATION: Alcoholic cirrhosis EXAM: ULTRASOUND GUIDED THERAPEUTIC PARACENTESIS MEDICATIONS: None. COMPLICATIONS: None immediate. PROCEDURE: Informed written consent was obtained from the patient after a discussion of the risks, benefits and alternatives to treatment. A timeout was performed prior to the initiation of the procedure. Initial ultrasound scanning demonstrates a large amount of ascites within the right lower abdominal quadrant. The right lower abdomen was prepped and draped in the usual sterile fashion. 1% lidocaine was used for local anesthesia. Following this, a 8 Fr Safe-T-Centesis catheter was introduced. An ultrasound image was saved for documentation purposes. The paracentesis was performed. The catheter was removed and a dressing was applied. The patient tolerated the procedure well without immediate post procedural complication. Patient received post-procedure intravenous albumin; see nursing notes for details. FINDINGS:  A total of approximately 4.1 L of straw-colored fluid was removed. IMPRESSION: Successful ultrasound-guided paracentesis yielding 4.1 liters of peritoneal fluid. Electronically Signed   By: Acquanetta Belling M.D.   On: 12/25/2020 10:07   US Paracentesis  Result Date: 12/18/2020 INDICATION: Alcoholic cirrhosis of the liver with ascites. EXAM: ULTRASOUND GUIDED  PARACENTESIS MEDICATIONS: None. COMPLICATIONS: None immediate. PROCEDURE: Informed written consent was obtained from the patient after a discussion of the risks, benefits and alternatives to treatment. A timeout was performed prior to the initiation of the procedure. Initial ultrasound scanning demonstrates a large amount of ascites within the right lower abdominal quadrant. The right lower abdomen was prepped and draped in the usual sterile fashion. 1% lidocaine was used for local anesthesia. Following this, a 6 Fr Safe-T-Centesis catheter was introduced. An ultrasound image was saved for documentation purposes. The paracentesis was performed. The catheter was removed and a dressing was applied. The patient tolerated the procedure well without immediate post procedural complication. FINDINGS: A total of approximately 3.75 L of yellow fluid was removed. IMPRESSION: Successful ultrasound-guided paracentesis yielding 3.75 liters of peritoneal fluid. Electronically Signed   By: Richarda Overlie M.D.   On: 12/18/2020 14:21   [place] ASCITES (ABDOMEN LIMITED)  Result Date: 01/06/2021 CLINICAL DATA:  Ascites EXAM: LIMITED ABDOMEN ULTRASOUND FOR ASCITES TECHNIQUE: Limited ultrasound survey for ascites was performed in all four abdominal quadrants. COMPARISON:  Ultrasound dated 01/04/2021 FINDINGS: There is a small volume of abdominal ascites. The liver appears cirrhotic. IMPRESSION: Small volume abdominal ascites. Electronically Signed   By: Katherine Mantle M.D.   On: 01/06/2021 16:09       Subjective: Patient weak, tremulous, somewhat dizzy.  Has confused spells.  No fever, no cough, no urinary irritative symptoms.  No vomiting, no melena.  Discharge Exam: Vitals:   01/10/21 1503 01/10/21 1520  BP: (!) 107/52 (!) 105/57  Pulse: 83 84  Resp: 13 10  Temp: 98.2 F (36.8 C) 97.7 F (36.5 C)  SpO2: 98% 97%   Vitals:   01/10/21 1400 01/10/21 1500 01/10/21 1503 01/10/21 1520  BP: (!) 102/56 (!) 96/55 (!) 107/52  (!) 105/57  Pulse: 83 84 83 84  Resp: (!) 9 (!) 7 13 10   Temp:   98.2 F (36.8 C) 97.7 F (36.5 C)  TempSrc:   Oral Oral  SpO2: 96% 96% 98% 97%  Weight:      Height:        General: Pt is awake, somewhat dazed, lying in bed, jaundiced Cardiovascular: RRR, nl S1-S2, no murmurs appreciated.  Trace LE edema.   Respiratory: Normal respiratory rate and rhythm.  CTAB without rales or wheezes. Abdominal: Abdomen soft and with mild nonfocal tenderness.Ascites without tenseness.  Neuro/Psych: Mild asterixis.  Gernalized weakness.  Strength symmetric in upper and lower extremities.  Judgment and insight appear somewhat impaired, psychomotor slowing noted.   The results of significant diagnostics from this hospitalization (including imaging, microbiology, ancillary and laboratory) are listed below for reference.     Microbiology: Recent Results (from the past 240 hour(s))  Body fluid culture     Status: None (Preliminary result)   Collection Time: 01/02/21  8:45 AM   Specimen: Ascitic; Body Fluid  Result Value Ref Range Status   Specimen Description   Final    ASCITIC Performed at [facility], [address] [place], [place]    Special Requests   Final    NONE Performed at [facility], [address] [place], [place]  [address]    Gram Stain   Final    FEW WBC PRESENT, PREDOMINANTLY MONONUCLEAR NO ORGANISMS SEEN    Culture   Final    NO GROWTH 2 DAYS Performed at [facility], [address], [place], [place] [address]    Report Status PENDING  Incomplete  Body fluid culture w Gram Stain     Status: None   Collection Time: 01/02/21  8:45 AM   Specimen: Ascitic  Result Value Ref Range Status   Specimen Description   Final    ASCITIC Performed at [facility], [address], [place], [place] [address]    Special Requests   Final    NONE Performed at [facility], [address] [place], [place] [address]    Gram  Stain   Final    FEW WBC PRESENT, PREDOMINANTLY MONONUCLEAR NO ORGANISMS SEEN    Culture   Final    NO GROWTH 3 DAYS Performed at [facility], [address], [place], [place] [address]    Report Status 01/06/2021 FINAL  Final  Culture, blood (Routine x 2)     Status: None   Collection Time: 01/02/21  6:26 PM   Specimen: BLOOD  Result Value Ref Range Status   Specimen Description BLOOD RIGHT ANTECUBITAL  Final   Special Requests   Final    BOTTLES DRAWN AEROBIC AND ANAEROBIC Blood Culture adequate volume   Culture   Final    NO GROWTH 5 DAYS Performed at [facility], [address] [place], [place] [address]    Report Status 01/07/2021 FINAL  Final  Culture, blood (Routine x 2)     Status: None   Collection Time: 01/02/21  6:29 PM   Specimen: BLOOD  Result Value Ref Range Status   Specimen Description BLOOD RIGHT ANTECUBITAL  Final   Special Requests   Final    BOTTLES DRAWN AEROBIC AND ANAEROBIC Blood Culture adequate volume   Culture   Final    NO GROWTH 5 DAYS Performed at [facility], [address], [place], [place] [address]    Report Status 01/07/2021 FINAL  Final  SARS CORONAVIRUS 2 (TAT 6-24 HRS) Nasopharyngeal Nasopharyngeal Swab     Status: None   Collection Time: 01/02/21  7:50 PM   Specimen: Nasopharyngeal Swab  Result Value Ref Range Status   SARS Coronavirus 2 NEGATIVE NEGATIVE Final    Comment: (NOTE) SARS-CoV-2 target nucleic acids are NOT DETECTED.  The SARS-CoV-2 RNA is generally detectable in upper and lower respiratory specimens during the acute phase of infection. Negative results do not preclude SARS-CoV-2 infection, do not rule out co-infections with other pathogens, and should not be used as the sole basis for treatment or other patient management decisions. Negative results must be combined with clinical observations, patient history, and epidemiological information. The expected result is Negative.  Fact Sheet  for Patients: HairSlick.no  Fact Sheet for Healthcare Providers: quierodirigir.com  This test is not yet approved or cleared by the [place] FDA and  has been authorized for detection and/or diagnosis of SARS-CoV-2 by FDA under an Emergency Use Authorization (EUA). This EUA will remain  in effect (meaning this test can be used) for the duration of the COVID-19 declaration under Se ction 564(b)(1) of the Act, 21 U.S.C. section 360bbb-3(b)(1), unless the authorization is terminated or revoked sooner.  Performed at [facility], [address] [place], [place] [address]   Body fluid  culture w Gram Stain     Status: None (Preliminary result)   Collection Time: 01/09/21 11:00 AM   Specimen: PATH Cytology Peritoneal fluid  Result Value Ref Range Status   Specimen Description   Final    PERITONEAL Performed at [facility], [address] [place], [place] [address]    Special Requests   Final    NONE Performed at [facility], [address] [place], [place] [address]    Gram Stain PENDING  Incomplete   Culture   Final    NO GROWTH < 24 HOURS Performed at [facility], [address] [place], [place] [address]    Report Status PENDING  Incomplete  MRSA PCR Screening     Status: None   Collection Time: 01/09/21  5:40 PM   Specimen: Nasopharyngeal  Result Value Ref Range Status   MRSA by PCR NEGATIVE NEGATIVE Final    Comment:        The GeneXpert MRSA Assay (FDA approved for NASAL specimens only), is one component of a comprehensive MRSA colonization surveillance program. It is not intended to diagnose MRSA infection nor to guide or monitor treatment for MRSA infections. Performed at [facility], [address] [place], [place] [address]      Labs: BNP (last 3 results) No results for input(s): BNP in the last 8760 hours. Basic Metabolic Panel: Recent Labs  Lab  01/06/21 0520 01/07/21 0531 01/08/21 0620 01/09/21 0516 01/09/21 1406 01/09/21 1806 01/09/21 2147 01/10/21 0625 01/10/21 1313  NA 126* 123* 120* 119* 119* 122* 121* 121* 119*  K 4.7 4.8 5.3* 5.3*  --   --   --  4.8  --   CL 98 95* 93* 94*  --   --   --  92*  --   CO2 24 22 21* 20*  --   --   --  20*  --   GLUCOSE 139* 119* 112* 140*  --   --   --  128*  --   BUN --   --   --  23*  --   CREATININE 0.37* <0.30* 0.31* 0.33*  --   --   --  0.38*  --   CALCIUM 8.4* 8.8* 8.7* 9.0  --   --   --  9.2  --   MG  --   --  2.1 2.1  --   --   --  2.2  --   PHOS  --  NOT VALID UNABLE TO REPORT DUE TO ICTERUS SPECIMEN.  UNABLE TO RESULT DUE TO ICTERUS   --   --   --  UNABLE TO REPORT DUE TO ICTERUS  --    Liver Function Tests: Recent Labs  Lab 01/06/21 0520 01/07/21 0531 01/08/21 0620 01/09/21 0516 01/10/21 0625  AST 66* 80* 74* 69* 69*  ALT 32 36 33 32 34  ALKPHOS 133* 121 135* 137* 113  BILITOT 12.8* 16.2* 13.5* 12.2* 15.0*  PROT 5.2* 5.2* 5.2* 5.3* 5.4*  ALBUMIN 3.2* 3.2* 3.1* 3.3* 3.5   No results for input(s): LIPASE, AMYLASE in the last 168 hours. Recent Labs  Lab 01/04/21 1428 01/08/21 0620 01/09/21 0516 01/10/21 0625  AMMONIA 34 UNABLE TO REPORT DUE TO ICTERUS SAMPLE 70* 46*   CBC: Recent Labs  Lab 01/06/21 0520 01/06/21 1826 01/07/21 0531 01/08/21 0620 01/09/21 0516 01/10/21 0625  WBC 10.8*  --  12.6* 13.1* 12.3* 11.8*  NEUTROABS  7.4  --  8.6* 8.9* 8.5* 8.3*  HGB 6.6* 8.7* 8.1* 7.6* 7.8* 6.9*  HCT 19.6* 25.1* 22.3* 21.4* 21.9* 19.8*  MCV 109.5*  --  102.3* 101.9* 102.3* 105.3*  PLT 74*  --  73* 78* 76* 69*   Cardiac Enzymes: No results for input(s): CKTOTAL, CKMB, CKMBINDEX, TROPONINI in the last 168 hours. BNP: Invalid input(s): POCBNP CBG: No results for input(s): GLUCAP in the last 168 hours. D-Dimer No results for input(s): DDIMER in the last 72 hours. Hgb A1c No results for input(s): HGBA1C in the last 72 hours. Lipid Profile No  results for input(s): CHOL, HDL, LDLCALC, TRIG, CHOLHDL, LDLDIRECT in the last 72 hours. Thyroid function studies No results for input(s): TSH, T4TOTAL, T3FREE, THYROIDAB in the last 72 hours.  Invalid input(s): FREET3 Anemia work up No results for input(s): VITAMINB12, FOLATE, FERRITIN, TIBC, IRON, RETICCTPCT in the last 72 hours. Urinalysis    Component Value Date/Time   COLORURINE AMBER (A) 01/03/2021 0550   APPEARANCEUR CLEAR (A) 01/03/2021 0550   APPEARANCEUR Turbid 08/07/2012 0746   LABSPEC 1.009 01/03/2021 0550   LABSPEC 1.026 08/07/2012 0746   PHURINE 7.0 01/03/2021 0550   GLUCOSEU NEGATIVE 01/03/2021 0550   GLUCOSEU Negative 08/07/2012 0746   HGBUR NEGATIVE 01/03/2021 0550   BILIRUBINUR NEGATIVE 01/03/2021 0550   BILIRUBINUR Negative 08/07/2012 0746   KETONESUR NEGATIVE 01/03/2021 0550   PROTEINUR NEGATIVE 01/03/2021 0550   NITRITE NEGATIVE 01/03/2021 0550   LEUKOCYTESUR NEGATIVE 01/03/2021 0550   LEUKOCYTESUR Negative 08/07/2012 0746   Sepsis Labs Invalid input(s): PROCALCITONIN,  WBC,  LACTICIDVEN Microbiology Recent Results (from the past 240 hour(s))  Body fluid culture     Status: None (Preliminary result)   Collection Time: 01/02/21  8:45 AM   Specimen: Ascitic; Body Fluid  Result Value Ref Range Status   Specimen Description   Final    ASCITIC Performed at [facility], [address] [place], [place] [address]    Special Requests   Final    NONE Performed at [facility], [address] [place], [place] [address]    Gram Stain   Final    FEW WBC PRESENT, PREDOMINANTLY MONONUCLEAR NO ORGANISMS SEEN    Culture   Final    NO GROWTH 2 DAYS Performed at [facility], [address] [place], [place] [address]    Report Status PENDING  Incomplete  Body fluid culture w Gram Stain     Status: None   Collection Time: 01/02/21  8:45 AM   Specimen: Ascitic  Result Value Ref Range Status   Specimen Description   Final     ASCITIC Performed at [facility], [address] [place], [place] [address]    Special Requests   Final    NONE Performed at [facility], [address] [place], [place] [address]    Gram Stain   Final    FEW WBC PRESENT, PREDOMINANTLY MONONUCLEAR NO ORGANISMS SEEN    Culture   Final    NO GROWTH 3 DAYS Performed at [facility], [address] [place], [place] [address]    Report Status 01/06/2021 FINAL  Final  Culture, blood (Routine x 2)     Status: None   Collection Time: 01/02/21  6:26 PM   Specimen: BLOOD  Result Value Ref Range Status   Specimen Description BLOOD RIGHT ANTECUBITAL  Final   Special Requests   Final    BOTTLES DRAWN AEROBIC AND ANAEROBIC  Blood Culture adequate volume   Culture   Final    NO GROWTH 5 DAYS Performed at [facility], [address] [place], [place] [address]    Report Status 01/07/2021 FINAL  Final  Culture, blood (Routine x 2)     Status: None   Collection Time: 01/02/21  6:29 PM   Specimen: BLOOD  Result Value Ref Range Status   Specimen Description BLOOD RIGHT ANTECUBITAL  Final   Special Requests   Final    BOTTLES DRAWN AEROBIC AND ANAEROBIC Blood Culture adequate volume   Culture   Final    NO GROWTH 5 DAYS Performed at [facility], [address] [place], [place] [address]    Report Status 01/07/2021 FINAL  Final  SARS CORONAVIRUS 2 (TAT 6-24 HRS) Nasopharyngeal Nasopharyngeal Swab     Status: None   Collection Time: 01/02/21  7:50 PM   Specimen: Nasopharyngeal Swab  Result Value Ref Range Status   SARS Coronavirus 2 NEGATIVE NEGATIVE Final    Comment: (NOTE) SARS-CoV-2 target nucleic acids are NOT DETECTED.  The SARS-CoV-2 RNA is generally detectable in upper and lower respiratory specimens during the acute phase of infection. Negative results do not preclude SARS-CoV-2 infection, do not rule out co-infections with other pathogens, and should not be used as the sole  basis for treatment or other patient management decisions. Negative results must be combined with clinical observations, patient history, and epidemiological information. The expected result is Negative.  Fact Sheet for Patients: HairSlick.no  Fact Sheet for Healthcare Providers: quierodirigir.com  This test is not yet approved or cleared by the [place] FDA and  has been authorized for detection and/or diagnosis of SARS-CoV-2 by FDA under an Emergency Use Authorization (EUA). This EUA will remain  in effect (meaning this test can be used) for the duration of the COVID-19 declaration under Se ction 564(b)(1) of the Act, 21 U.S.C. section 360bbb-3(b)(1), unless the authorization is terminated or revoked sooner.  Performed at [facility], [address], [place], [place] [address]   Body fluid culture w Gram Stain     Status: None (Preliminary result)   Collection Time: 01/09/21 11:00 AM   Specimen: PATH Cytology Peritoneal fluid  Result Value Ref Range Status   Specimen Description   Final    PERITONEAL Performed at [facility], [address], [place], [place] [address]    Special Requests   Final    NONE Performed at [facility], [address] [place], [place] [address]    Gram Stain PENDING  Incomplete   Culture   Final    NO GROWTH < 24 HOURS Performed at [facility], [address] [place], [place] [address]    Report Status PENDING  Incomplete  MRSA PCR Screening     Status: None   Collection Time: 01/09/21  5:40 PM   Specimen: Nasopharyngeal  Result Value Ref Range Status   MRSA by PCR NEGATIVE NEGATIVE Final    Comment:        The GeneXpert MRSA Assay (FDA approved for NASAL specimens only), is one component of a comprehensive MRSA colonization surveillance program. It is not intended to diagnose MRSA infection nor to guide or monitor treatment for MRSA  infections. Performed at [facility], [address] [place], [place] [address]      Time coordinating discharge: 50 minutes The patient is critically ill with multi-organ failure.  Critical care was necessary to  treat or prevent imminent or life-threatening deterioration of cardiac and liver failure and was exclusive of separately billable procedures and treating other patients. Total critical care time spent by me: 50 minutes Time spent personally by me on obtaining history from patient or surrogate, evaluation of the patient, evaluation of patient's response to treatment, ordering and review of laboratory studies, ordering and review of radiographic studies, ordering and performing treatments and interventions, and re-evaluation of the patient's condition.       SIGNED:   Alberteen Sam, MD  [facility] 01/10/2021, 4:58 PM

## 2021-01-10 NOTE — Progress Notes (Signed)
Pt has remained alert and oriented with c/o right, lower abdominal pain 5/10 -> pt requested warm pack to area d/t soft pressures. Pt has remained afebrile. Pt has remained in NSR on cardiac monitor, HR WNL. Levo stopped at 1751. 1UPRVC given for Hgb 6.9 -> BP Improved, no reaction noted. Pt has remained on RA, SpO2 > 95%, lung sounds clear to auscultation, NDN.  Pt has been very nauseous this shift with inability to give PO meds this evening ->zofran, Thorazine. Pt has ambulated to/from [facility] NDN.  [facility] transplant nurse updated via phone in regards to patient status.

## 2021-01-10 NOTE — Progress Notes (Signed)
Patient currently has levophed infusing and BP improved. Pt c/o abd pain, requesting pain medication. Verified with NP, ok to give PRN pain medication per order.

## 2021-01-10 NOTE — Progress Notes (Signed)
Per NP, hold PRN Trazadone and Oxycodone due to hypotensive.

## 2021-01-10 NOTE — Progress Notes (Signed)
Na level 119, NP notified. Acknowledged, no new orders at present.

## 2021-01-10 NOTE — Consult Note (Signed)
Pharmacy Antibiotic Note  Lindsey Huang is a 40 y.o. female admitted on 01/02/2021 with worsening abdominal pain over the last couple days as well as increasing ascitic fluid despite 7 day course of ceftriaxone. Patient with history of  portal HTN (receiving paracentesis~q5), and multiple episodes of SBP.  Pharmacy has been consulted for Zosyn dosing for Intra-abdominal infection.  Plan: Zosyn 3.375g IV q8h (4 hour infusion).  Follow up with culture results Monitor renal function   Height: 4\' 11"  (149.9 cm) Weight: 66.7 kg (147 lb 0.8 oz) IBW/kg (Calculated) : 43.2  Temp (24hrs), Avg:98 F (36.7 C), Min:97.7 F (36.5 C), Max:98.6 F (37 C)  Recent Labs  Lab 01/06/21 0520 01/07/21 0531 01/08/21 0620 01/09/21 0516 01/10/21 0625  WBC 10.8* 12.6* 13.1* 12.3* 11.8*  CREATININE 0.37* <0.30* 0.31* 0.33* 0.38*    Estimated Creatinine Clearance: 76.8 mL/min (A) (by C-G formula based on SCr of 0.38 mg/dL (L)).    No Known Allergies  Antimicrobials this admission: 2/11 Ceftriaxone >> 2/18 2/11 Rifaximin >>   Dose adjustments this admission: n/a  Microbiology results: 2/19 BCx: sent 2/18 peritoneal fluid: NGTD    Thank you for allowing pharmacy to be a part of this patient's care.  3/18, PharmD, BCPS Clinical Pharmacist  01/10/2021 3:44 PM

## 2021-01-10 NOTE — Progress Notes (Signed)
NP notified via secure chat BP 88/50 recheck 88/49; Patient has had PRN Trazadone and Oxycodone this shift. Will continue to monitor. Awaiting response

## 2021-01-10 NOTE — Progress Notes (Signed)
NP notified via secure chat that BP continues to be low 83/46 recheck 77/47. NP acknowledged and will enter new orders.

## 2021-01-10 NOTE — Progress Notes (Signed)
Levophed started per order. Continue to monitor.

## 2021-01-11 ENCOUNTER — Inpatient Hospital Stay
Admit: 2021-01-11 | Discharge: 2021-01-11 | Disposition: A | Discharge status: Home / Self Care | Payer: Medicaid Other | Attending: Family Medicine | Admitting: Family Medicine

## 2021-01-11 DIAGNOSIS — K652 Spontaneous bacterial peritonitis: Secondary | ICD-10-CM | POA: Diagnosis not present

## 2021-01-11 LAB — CBC WITH DIFFERENTIAL/PLATELET
Abs Immature Granulocytes: 0.83 10*3/uL — ABNORMAL HIGH (ref 0.00–0.07)
Basophils Absolute: 0.1 10*3/uL (ref 0.0–0.1)
Basophils Relative: 1 %
Eosinophils Absolute: 0.4 10*3/uL (ref 0.0–0.5)
Eosinophils Relative: 4 %
HCT: 20.8 % — ABNORMAL LOW (ref 36.0–46.0)
Hemoglobin: 7.6 g/dL — ABNORMAL LOW (ref 12.0–15.0)
Immature Granulocytes: 7 %
Lymphocytes Relative: 8 %
Lymphs Abs: 0.9 10*3/uL (ref 0.7–4.0)
MCH: 36.5 pg — ABNORMAL HIGH (ref 26.0–34.0)
MCHC: 36.5 g/dL — ABNORMAL HIGH (ref 30.0–36.0)
MCV: 100 fL (ref 80.0–100.0)
Monocytes Absolute: 1.2 10*3/uL — ABNORMAL HIGH (ref 0.1–1.0)
Monocytes Relative: 11 %
Neutro Abs: 7.8 10*3/uL — ABNORMAL HIGH (ref 1.7–7.7)
Neutrophils Relative %: 69 %
Platelets: 57 10*3/uL — ABNORMAL LOW (ref 150–400)
RBC: 2.08 MIL/uL — ABNORMAL LOW (ref 3.87–5.11)
RDW: 22.3 % — ABNORMAL HIGH (ref 11.5–15.5)
WBC: 11.2 10*3/uL — ABNORMAL HIGH (ref 4.0–10.5)
nRBC: 0 % (ref 0.0–0.2)

## 2021-01-11 LAB — COMPREHENSIVE METABOLIC PANEL
ALT: 28 U/L (ref 0–44)
AST: 63 U/L — ABNORMAL HIGH (ref 15–41)
Albumin: 3.9 g/dL (ref 3.5–5.0)
Alkaline Phosphatase: 103 U/L (ref 38–126)
Anion gap: 7 (ref 5–15)
BUN: 28 mg/dL — ABNORMAL HIGH (ref 6–20)
CO2: 21 mmol/L — ABNORMAL LOW (ref 22–32)
Calcium: 9.6 mg/dL (ref 8.9–10.3)
Chloride: 91 mmol/L — ABNORMAL LOW (ref 98–111)
Creatinine, Ser: 0.33 mg/dL — ABNORMAL LOW (ref 0.44–1.00)
GFR, Estimated: >60 mL/min (ref >60)
Glucose, Bld: 125 mg/dL — ABNORMAL HIGH (ref 70–99)
Potassium: 5.2 mmol/L — ABNORMAL HIGH (ref 3.5–5.1)
Sodium: 119 mmol/L — CL (ref 135–145)
Total Bilirubin: 15.6 mg/dL — ABNORMAL HIGH (ref 0.3–1.2)
Total Protein: 5.5 g/dL — ABNORMAL LOW (ref 6.5–8.1)

## 2021-01-11 LAB — SODIUM
Sodium: 118 mmol/L — CL (ref 135–145)
Sodium: 118 mmol/L — CL (ref 135–145)

## 2021-01-11 LAB — ECHOCARDIOGRAM COMPLETE
AR max vel: 2.92 cm2
AV Area VTI: 2.92 cm2
AV Area mean vel: 2.8 cm2
AV Mean grad: 10.5 mmHg
AV Peak grad: 23 mmHg
Ao pk vel: 2.4 m/s
Area-P 1/2: 2.91 cm2
Height: 59 in
S' Lateral: 2.87 cm
Weight: 2352.75 oz

## 2021-01-11 LAB — PHOSPHORUS: Phosphorus: UNDETERMINED mg/dL (ref 2.5–4.6)

## 2021-01-11 LAB — MAGNESIUM: Magnesium: 2.7 mg/dL — ABNORMAL HIGH (ref 1.7–2.4)

## 2021-01-11 LAB — PROTIME-INR
INR: 2.8 — ABNORMAL HIGH (ref 0.8–1.2)
Prothrombin Time: 28.9 seconds — ABNORMAL HIGH (ref 11.4–15.2)

## 2021-01-11 LAB — PROTEIN, BODY FLUID (OTHER): Total Protein, Body Fluid Other: 0.4 g/dL

## 2021-01-11 LAB — AMMONIA: Ammonia: 63 umol/L — ABNORMAL HIGH (ref 9–35)

## 2021-01-11 MED ORDER — ALBUMIN HUMAN 25 % IV SOLN
25.0000 g | Freq: Four times a day (QID) | INTRAVENOUS | Status: AC
  Administered 2021-01-11 – 2021-01-12 (×3): 25 g via INTRAVENOUS
  Filled 2021-01-11 (×3): qty 100

## 2021-01-11 MED ORDER — SPIRONOLACTONE 25 MG PO TABS
50.0000 mg | ORAL_TABLET | Freq: Every day | ORAL | Status: DC
Start: 2021-01-12 — End: 2021-01-16
  Administered 2021-01-12 – 2021-01-16 (×5): 50 mg via ORAL
  Filled 2021-01-11 (×5): qty 2

## 2021-01-11 MED ORDER — FUROSEMIDE 10 MG/ML IJ SOLN
20.0000 mg | Freq: Once | INTRAMUSCULAR | Status: AC
  Administered 2021-01-11: 20 mg via INTRAVENOUS
  Filled 2021-01-11: qty 2

## 2021-01-11 MED ORDER — SPIRONOLACTONE 50 MG PO TABS: 50.0000 mg | ORAL_TABLET | Freq: Every day | ORAL | Status: AC

## 2021-01-11 NOTE — Progress Notes (Signed)
Patient had an uneventful night. Sodium level unchanged at 119. Patient had several question about home health care and agreed to speak with TOC team. Hg is 7.6 this morning. Tolerating albumin without difficulties. Blood pressures are still soft, will endorse to oncoming RN.

## 2021-01-11 NOTE — Progress Notes (Signed)
Date and time results received: 01/11/21 : 0630  Test: Sodium  Critical Value: 119   Name of Provider Notified: Ouma NP  Orders Received? None at this time

## 2021-01-11 NOTE — Discharge Summary (Signed)
Physician Discharge Summary  Lindsey Huang NUU:[id number] DOB: 05-10-1981 DOA: 01/02/2021  PCP: Joya Martyr, MD  Admit date: 01/02/2021 Discharge date: 01/11/2021  Admitted From: Home  Disposition:  Transfer to [facility]     Discharge Condition: Deteriorating  CODE STATUS: FULL Diet recommendation: Low sodium, fluid restricted       Brief/Interim Summary: Lindsey Huang is a 40 y.o. 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  Has periodic outpatient paracentesis.  On day before admission, had an outpatient paracentesis, showed 517 nucleated cells, sent to ER.   2/12: Admitted, started on Rocephin 2/13 - 2/18: Patient had persistent abdominal distension, unclear if constipation or ascites  -completed 5 days CTX and constipation resolved   -but still symptomatic, increasingly encephalopathic  2/18: Repeat US paracentesis with 4L fluid  -BP trending down to 80s/40s -> started on levophed  -discussed with [facility] and hospitalist medicine, accepted for transfer for acute decompensated liver failure  2/19: Transfused blood, additional albumin and levophed weaned off  2/20: Remained off Levophed         PRINCIPAL HOSPITAL DIAGNOSIS: Decompensated liver cirrhosis       Assessment & Plan:  Decompensated cirrhosis Alcoholic cirrhosis with ascites/portal hypertension, encephalopathy, thrombocytopenia Hepatic encephalopathy Hyponatremia, hypochloremia, and coagulopathy due to cirrhosis Patient initially admitted and started on Rocephin for SBP on 2/12.  Had progressive weakness, malaise, abdominal distension/pain, and then confusion.  Due to progressive abdominal distension, had repeat paracentesis on 2/18, nucleated cell count improved to 82 from >500 prior to admission.      After second paracentesis, patient got albumin 25g, but BP drifted down to 80s/40s and so Levophed was started  peripherally and she was transferred to [facility].  Blood cultures drawn and antibiotics broadened to Zosyn.  No new fever.  Culture on repeat paracentesis and blood cultures no growth to date.    MELD 33.   She remains encephalopathic, weak, jaundiced consistent with acute on chronic liver failure.  Discussed with Dr Allena Katz from [facility] transplant medicine yesterday and Dr. Louie Bun from Internal medicine today.    Na trending down.  Remains grossly overloaded on exam. -Continue Albumin 25 g TID today -Continue furosemide and Spironolactone  -Continue Lactulose and rifaximin  -Daily BMP    Normocytic anemia Transfused 2/15 and 2/19 -Continue iron -Continue PPI    Spontaneous bacterial peritonitis Paracentesis 2/11 with negative culture but 517 nuc cells per mm3.  Treated with 5 days Rocephin.  Paracentesis 2/18 with only 82 nuc cells/mm3, gram stain negative.  Deteriorating in last 72 hours, Rocephin broadened to Zosyn empirically.  Cultures no growth, this could probably be stopped tomorrow if no growth at 48 hours.    -Continue Zosyn, day 2                Discharge Instructions   Allergies as of 01/11/2021   No Known Allergies     Medication List    TAKE these medications   albumin human 25 % bottle Inject 100 mLs (25 g total) into the vein every 6 (six) hours.   feeding supplement Liqd Take 237 mLs by mouth 2 (two) times daily between meals.   ferrous sulfate 325 (65 FE) MG tablet Take 1 tablet (325 mg total) by mouth daily with breakfast.   folic acid 1 MG tablet Commonly known as: FOLVITE Take 1 tablet (1 mg total) by mouth daily.   furosemide 20 MG tablet Commonly known as:  LASIX Take 3 tablets (60 mg total) by mouth daily.   lactulose 10 GM/15ML solution Commonly known as: CHRONULAC Take 15 mLs (10 g total) by mouth 2 (two) times daily.   midodrine 10 MG tablet Commonly known as: PROAMATINE Take 1 tablet (10 mg total) by mouth 3 (three)  times daily with meals.   multivitamin with minerals Tabs tablet Take 1 tablet by mouth daily.   norepinephrine 4-5 MG/250ML-% Soln Commonly known as: LEVOPHED Inject 2-10 mcg/min into the vein continuous.   ondansetron 4 MG tablet Commonly known as: ZOFRAN Take 1 tablet (4 mg total) by mouth every 6 (six) hours as needed for nausea.   ondansetron 4 MG/2ML Soln injection Commonly known as: ZOFRAN Inject 2 mLs (4 mg total) into the vein every 6 (six) hours as needed for nausea.   Oxycodone HCl 10 MG Tabs Take 1 tablet (10 mg total) by mouth every 4 (four) hours as needed for moderate pain.   pantoprazole 40 MG tablet Commonly known as: PROTONIX Take 1 tablet (40 mg total) by mouth daily.   piperacillin-tazobactam 3.375 GM/50ML IVPB Commonly known as: ZOSYN Inject 50 mLs (3.375 g total) into the vein every 8 (eight) hours.   potassium chloride 10 MEQ tablet Commonly known as: KLOR-CON Take 1 tablet (10 mEq total) by mouth daily.   rifaximin 550 MG Tabs tablet Commonly known as: XIFAXAN Take 1 tablet (550 mg total) by mouth 2 (two) times daily.   spironolactone 50 MG tablet Commonly known as: ALDACTONE Take 1 tablet (50 mg total) by mouth daily.   thiamine 100 MG tablet Take 100 mg by mouth daily.   traZODone 100 MG tablet Commonly known as: DESYREL Take 1 tablet (100 mg total) by mouth at bedtime as needed for sleep.   vitamin B-12 1000 MCG tablet Commonly known as: CYANOCOBALAMIN Take 1,000 mcg by mouth daily.       No Known Allergies     Procedures/Studies: DG Chest 2 View  Result Date: 01/02/2021 CLINICAL DATA:  Suspected sepsis, paracentesis morning with reported infection EXAM: CHEST - 2 VIEW COMPARISON:  Radiograph 11/23/2020 FINDINGS: No consolidation, features of edema, pneumothorax, or effusion. Pulmonary vascularity is normally distributed. The cardiomediastinal contours are unremarkable. No acute osseous or soft tissue abnormality. IMPRESSION: No  acute cardiopulmonary abnormality. Electronically Signed   By: Kreg Shropshire M.D.   On: 01/02/2021 19:01   DG Abd 1 View  Result Date: 01/07/2021 CLINICAL DATA:  Constipation, EGD 11/24/2020 EXAM: ABDOMEN - 1 VIEW COMPARISON:  01/04/2021 FINDINGS: Gaseous distention of stomach. Air-filled loops of small bowel and colon without distension. Decreased stool burden. Remains stool overlying the distal transverse colon and splenic flexure. IMPRESSION: Gaseous distention of the stomach. Decreased stool burden since the prior study. Focal large volume of stool near the splenic flexure. Electronically Signed   By: Guadlupe Spanish M.D.   On: 01/07/2021 14:20   DG Abd 1 View  Result Date: 01/04/2021 CLINICAL DATA:  Constipation EXAM: ABDOMEN - 1 VIEW COMPARISON:  None. FINDINGS: The bowel gas pattern is nonobstructive. There is a very large amount of stool throughout the colon. There is no visualized acute osseous abnormality. No radiopaque kidney stones. IMPRESSION: Very large amount of stool throughout the colon. No evidence of bowel obstruction. Electronically Signed   By: Katherine Mantle M.D.   On: 01/04/2021 19:44   US Abdomen Limited  Result Date: 01/04/2021 CLINICAL DATA:  Evaluate ascites. EXAM: LIMITED ABDOMEN ULTRASOUND FOR ASCITES TECHNIQUE: Limited ultrasound survey for  ascites was performed in all four abdominal quadrants. COMPARISON:  December 11, 2020. FINDINGS: Small ascites, mostly in the left lower quadrant. IMPRESSION: 1. Small ascites, not enough for paracentesis. Electronically Signed   By: Obie Dredge M.D.   On: 01/04/2021 11:39   US Paracentesis  Result Date: 01/09/2021 INDICATION: Patient with a history of alcoholic cirrhosis and recurrent large volume ascites. Request is for a therapeutic and diagnostic paracentesis up to 4 L. EXAM: ULTRASOUND GUIDED PARACENTESIS MEDICATIONS: 1% lidocaine 10 mL COMPLICATIONS: None immediate. PROCEDURE: Informed written consent was obtained from the  patient after a discussion of the risks, benefits and alternatives to treatment. A timeout was performed prior to the initiation of the procedure. Initial ultrasound scanning demonstrates a large amount of ascites within the right lower abdominal quadrant. The right lower abdomen was prepped and draped in the usual sterile fashion. 1% lidocaine was used for local anesthesia. Following this, a 19 gauge, 7-cm, Yueh catheter was introduced. An ultrasound image was saved for documentation purposes. The paracentesis was performed. The catheter was removed and a dressing was applied. The patient tolerated the procedure well without immediate post procedural complication. FINDINGS: A total of approximately 4 L of hazy yellow fluid was removed. Samples were sent to the laboratory as requested by the clinical team. IMPRESSION: Successful ultrasound-guided paracentesis yielding 4 liters of peritoneal fluid. Read by: Alwyn Ren, NP Electronically Signed   By: Judie Petit.  Shick M.D.   On: 01/09/2021 12:03   US Paracentesis  Result Date: 01/02/2021 INDICATION: Patient with history of alcoholic cirrhosis with recurrent ascites. Request is for therapeutic and diagnostic paracentesis with a maximum of 5 L EXAM: ULTRASOUND GUIDED THERAPEUTIC AND DIAGNOSTIC PARACENTESIS MEDICATIONS: Lidocaine 1% 10 mL COMPLICATIONS: None immediate. PROCEDURE: Informed written consent was obtained from the patient after a discussion of the risks, benefits and alternatives to treatment. A timeout was performed prior to the initiation of the procedure. Initial ultrasound scanning demonstrates a large amount of ascites within the right lower abdominal quadrant. The right lower abdomen was prepped and draped in the usual sterile fashion. 1% lidocaine was used for local anesthesia. Following this, a 19 gauge, 7-cm, Yueh catheter was introduced. An ultrasound image was saved for documentation purposes. The paracentesis was performed. The catheter was  removed and a dressing was applied. The patient tolerated the procedure well without immediate post procedural complication. Patient received post-procedure intravenous albumin; see nursing notes for details. FINDINGS: A total of approximately 5 L of straw-colored fluid was removed. Samples were sent to the laboratory as requested by the clinical team. IMPRESSION: Successful ultrasound-guided therapeutic and diagnostic paracentesis yielding 5 liters of peritoneal fluid. Read by: Anders Grant, NP Electronically Signed   By: Simonne Come M.D.   On: 01/02/2021 10:09   US Paracentesis  Result Date: 12/25/2020 INDICATION: Alcoholic cirrhosis EXAM: ULTRASOUND GUIDED THERAPEUTIC PARACENTESIS MEDICATIONS: None. COMPLICATIONS: None immediate. PROCEDURE: Informed written consent was obtained from the patient after a discussion of the risks, benefits and alternatives to treatment. A timeout was performed prior to the initiation of the procedure. Initial ultrasound scanning demonstrates a large amount of ascites within the right lower abdominal quadrant. The right lower abdomen was prepped and draped in the usual sterile fashion. 1% lidocaine was used for local anesthesia. Following this, a 8 Fr Safe-T-Centesis catheter was introduced. An ultrasound image was saved for documentation purposes. The paracentesis was performed. The catheter was removed and a dressing was applied. The patient tolerated the procedure well without immediate post procedural  complication. Patient received post-procedure intravenous albumin; see nursing notes for details. FINDINGS: A total of approximately 4.1 L of straw-colored fluid was removed. IMPRESSION: Successful ultrasound-guided paracentesis yielding 4.1 liters of peritoneal fluid. Electronically Signed   By: Acquanetta Belling M.D.   On: 12/25/2020 10:07   US Paracentesis  Result Date: 12/18/2020 INDICATION: Alcoholic cirrhosis of the liver with ascites. EXAM: ULTRASOUND GUIDED  PARACENTESIS MEDICATIONS: None. COMPLICATIONS: None immediate. PROCEDURE: Informed written consent was obtained from the patient after a discussion of the risks, benefits and alternatives to treatment. A timeout was performed prior to the initiation of the procedure. Initial ultrasound scanning demonstrates a large amount of ascites within the right lower abdominal quadrant. The right lower abdomen was prepped and draped in the usual sterile fashion. 1% lidocaine was used for local anesthesia. Following this, a 6 Fr Safe-T-Centesis catheter was introduced. An ultrasound image was saved for documentation purposes. The paracentesis was performed. The catheter was removed and a dressing was applied. The patient tolerated the procedure well without immediate post procedural complication. FINDINGS: A total of approximately 3.75 L of yellow fluid was removed. IMPRESSION: Successful ultrasound-guided paracentesis yielding 3.75 liters of peritoneal fluid. Electronically Signed   By: Richarda Overlie M.D.   On: 12/18/2020 14:21   [place] ASCITES (ABDOMEN LIMITED)  Result Date: 01/06/2021 CLINICAL DATA:  Ascites EXAM: LIMITED ABDOMEN ULTRASOUND FOR ASCITES TECHNIQUE: Limited ultrasound survey for ascites was performed in all four abdominal quadrants. COMPARISON:  Ultrasound dated 01/04/2021 FINDINGS: There is a small volume of abdominal ascites. The liver appears cirrhotic. IMPRESSION: Small volume abdominal ascites. Electronically Signed   By: Katherine Mantle M.D.   On: 01/06/2021 16:09      Subjective: No new fever, rigors.  Still weak, tremor, difficulty holding of her phone, somewhat confused.  No cough, respiratory symptoms, no urinary irritative symptoms, no vomiting or melena.         Discharge Exam: Vitals:   01/11/21 1300 01/11/21 1455  BP: 108/62 (!) 95/54  Pulse: 87 79  Resp: 12 16  Temp:  98 F (36.7 C)  SpO2: 100% 99%   Vitals:   01/11/21 1100 01/11/21 1200 01/11/21 1300 01/11/21 1455  BP:  (!) 91/54 (!) 95/50 108/62 (!) 95/54  Pulse: 82 82 87 79  Resp: 11 (!) 9 12 16   Temp:    98 F (36.7 C)  TempSrc:      SpO2: 96% 97% 100% 99%  Weight:      Height:       General appearance: Adult female, lying in bed, interactive     HEENT: Jaundiced, icteric, lids and lashes normal, no nasal deformity, discharge, or epistaxis, oropharynx moist, no oral lesions, hearing normal Skin: Jaundiced, no suspicious rashes or lesions. Cardiac: RRR, systolic murmur noted, radiating to the right neck, pitting edema in the shins, JVP is obvious but she is laying flat. Respiratory: Respiratory effort normal, lungs clear without rales or wheezes, overall air movement diminished. Abdomen: Large with ascites, but not firm, tense, or painful. MSK: Diminished muscle mass and fat Neuro: Awake and interactive, extraocular movements intact, moves all extremities with generalized weakness, moderate tremor, asterixis mild, normal psychomotor slowing.  Speech fluent. Psych: Attention diminished, psychomotor slowing noted, affect blunted, judgment insight appear moderately impaired    The results of significant diagnostics from this hospitalization (including imaging, microbiology, ancillary and laboratory) are listed below for reference.     Microbiology: Recent Results (from the past 240 hour(s))  Body fluid culture  Status: None (Preliminary result)   Collection Time: 01/02/21  8:45 AM   Specimen: Ascitic; Body Fluid  Result Value Ref Range Status   Specimen Description   Final    ASCITIC Performed at [facility], [address] [place], [place] [address]    Special Requests   Final    NONE Performed at [facility], [address] [place], [place] [address]    Gram Stain   Final    FEW WBC PRESENT, PREDOMINANTLY MONONUCLEAR NO ORGANISMS SEEN    Culture   Final    NO GROWTH 2 DAYS Performed at [facility], [address] [place], [place] [address]    Report  Status PENDING  Incomplete  Body fluid culture w Gram Stain     Status: None   Collection Time: 01/02/21  8:45 AM   Specimen: Ascitic  Result Value Ref Range Status   Specimen Description   Final    ASCITIC Performed at [facility], [address] [place], [place] [address]    Special Requests   Final    NONE Performed at [facility], [address] [place], [place] [address]    Gram Stain   Final    FEW WBC PRESENT, PREDOMINANTLY MONONUCLEAR NO ORGANISMS SEEN    Culture   Final    NO GROWTH 3 DAYS Performed at [facility], [address] [place], [place] [address]    Report Status 01/06/2021 FINAL  Final  Culture, blood (Routine x 2)     Status: None   Collection Time: 01/02/21  6:26 PM   Specimen: BLOOD  Result Value Ref Range Status   Specimen Description BLOOD RIGHT ANTECUBITAL  Final   Special Requests   Final    BOTTLES DRAWN AEROBIC AND ANAEROBIC Blood Culture adequate volume   Culture   Final    NO GROWTH 5 DAYS Performed at [facility], [address] [place], [place] [address]    Report Status 01/07/2021 FINAL  Final  Culture, blood (Routine x 2)     Status: None   Collection Time: 01/02/21  6:29 PM   Specimen: BLOOD  Result Value Ref Range Status   Specimen Description BLOOD RIGHT ANTECUBITAL  Final   Special Requests   Final    BOTTLES DRAWN AEROBIC AND ANAEROBIC Blood Culture adequate volume   Culture   Final    NO GROWTH 5 DAYS Performed at [facility], [address] [place], [place] [address]    Report Status 01/07/2021 FINAL  Final  SARS CORONAVIRUS 2 (TAT 6-24 HRS) Nasopharyngeal Nasopharyngeal Swab     Status: None   Collection Time: 01/02/21  7:50 PM   Specimen: Nasopharyngeal Swab  Result Value Ref Range Status   SARS Coronavirus 2 NEGATIVE NEGATIVE Final    Comment: (NOTE) SARS-CoV-2 target nucleic acids are NOT DETECTED.  The SARS-CoV-2 RNA is generally detectable in upper and  lower respiratory specimens during the acute phase of infection. Negative results do not preclude SARS-CoV-2 infection, do not rule out co-infections with other pathogens, and should not be used as the sole basis for treatment or other patient management decisions. Negative results must be combined with clinical observations, patient history, and epidemiological information. The expected result is Negative.  Fact Sheet for Patients: HairSlick.nohttps://www.fda.gov/media/138098/download  Fact Sheet for Healthcare Providers: quierodirigir.comhttps://www.fda.gov/media/138095/download  This test is not yet approved or cleared by the [place] FDA and  has been authorized for  detection and/or diagnosis of SARS-CoV-2 by FDA under an Emergency Use Authorization (EUA). This EUA will remain  in effect (meaning this test can be used) for the duration of the COVID-19 declaration under Se ction 564(b)(1) of the Act, 21 U.S.C. section 360bbb-3(b)(1), unless the authorization is terminated or revoked sooner.  Performed at [facility], [address] [place], [place] [address]   Body fluid culture w Gram Stain     Status: None (Preliminary result)   Collection Time: 01/09/21 11:00 AM   Specimen: PATH Cytology Peritoneal fluid  Result Value Ref Range Status   Specimen Description   Final    PERITONEAL Performed at [facility], [address], [place], [place] [address]    Special Requests   Final    NONE Performed at [facility], [address] [place], [place] [address]    Gram Stain NO WBC SEEN NO ORGANISMS SEEN   Final   Culture   Final    NO GROWTH 2 DAYS Performed at [facility], [address] [place], [place] [address]    Report Status PENDING  Incomplete  MRSA PCR Screening     Status: None   Collection Time: 01/09/21  5:40 PM   Specimen: Nasopharyngeal  Result Value Ref Range Status   MRSA by PCR NEGATIVE NEGATIVE Final    Comment:        The GeneXpert MRSA  Assay (FDA approved for NASAL specimens only), is one component of a comprehensive MRSA colonization surveillance program. It is not intended to diagnose MRSA infection nor to guide or monitor treatment for MRSA infections. Performed at [facility], [address] [place], [place] [address]   CULTURE, BLOOD (ROUTINE X 2) w Reflex to ID Panel     Status: None (Preliminary result)   Collection Time: 01/10/21  3:56 PM   Specimen: BLOOD  Result Value Ref Range Status   Specimen Description BLOOD LEFT ARM  Final   Special Requests   Final    BOTTLES DRAWN AEROBIC AND ANAEROBIC Blood Culture results may not be optimal due to an excessive volume of blood received in culture bottles   Culture   Final    NO GROWTH < 24 HOURS Performed at [facility], [address] [place], [place] [address]    Report Status PENDING  Incomplete  CULTURE, BLOOD (ROUTINE X 2) w Reflex to ID Panel     Status: None (Preliminary result)   Collection Time: 01/10/21  3:58 PM   Specimen: BLOOD  Result Value Ref Range Status   Specimen Description BLOOD LEFT HAND  Final   Special Requests   Final    BOTTLES DRAWN AEROBIC AND ANAEROBIC Blood Culture results may not be optimal due to an excessive volume of blood received in culture bottles   Culture   Final    NO GROWTH < 24 HOURS Performed at [facility], [address] [place], [place] [address]    Report Status PENDING  Incomplete     Labs: BNP (last 3 results) No results for input(s): BNP in the last 8760 hours. Basic Metabolic Panel: Recent Labs  Lab 01/07/21 0531 01/08/21 0620 01/09/21 0516 01/09/21 1406 01/10/21 0625 01/10/21 1313 01/10/21 1839 01/10/21 2143 01/11/21 0534 01/11/21 0804 01/11/21 1321  NA 123* 120* 119*   < > 121*   < > 118* 119* 119* 118* 118*  K 4.8 5.3* 5.3*  --  4.8  --   --   --  5.2*  --   --   CL 95* 93* 94*  --  92*  --   --   --  91*  --   --   CO2 22 21* 20*  --  20*  --   --    --  21*  --   --   GLUCOSE 119* 112* 140*  --  128*  --   --   --  125*  --   --   BUN --  23*  --   --   --  28*  --   --   CREATININE <0.30* 0.31* 0.33*  --  0.38*  --   --   --  0.33*  --   --   CALCIUM 8.8* 8.7* 9.0  --  9.2  --   --   --  9.6  --   --   MG  --  2.1 2.1  --  2.2  --   --   --  2.7*  --   --   PHOS NOT VALID UNABLE TO REPORT DUE TO ICTERUS SPECIMEN.  UNABLE TO RESULT DUE TO ICTERUS   --  UNABLE TO REPORT DUE TO ICTERUS  --   --   --  UNABLE TO REPORT DUE TO ICTERUS  --   --    < > = values in this interval not displayed.   Liver Function Tests: Recent Labs  Lab 01/07/21 0531 01/08/21 0620 01/09/21 0516 01/10/21 0625 01/11/21 0534  AST 80* 74* 69* 69* 63*  ALT 36 33 32 34 28  ALKPHOS 121 135* 137* 113 103  BILITOT 16.2* 13.5* 12.2* 15.0* 15.6*  PROT 5.2* 5.2* 5.3* 5.4* 5.5*  ALBUMIN 3.2* 3.1* 3.3* 3.5 3.9   No results for input(s): LIPASE, AMYLASE in the last 168 hours. Recent Labs  Lab 01/08/21 0620 01/09/21 0516 01/10/21 0625 01/11/21 0534  AMMONIA UNABLE TO REPORT DUE TO ICTERUS SAMPLE 70* 46* 63*   CBC: Recent Labs  Lab 01/07/21 0531 01/08/21 0620 01/09/21 0516 01/10/21 0625 01/11/21 0534  WBC 12.6* 13.1* 12.3* 11.8* 11.2*  NEUTROABS 8.6* 8.9* 8.5* 8.3* 7.8*  HGB 8.1* 7.6* 7.8* 6.9* 7.6*  HCT 22.3* 21.4* 21.9* 19.8* 20.8*  MCV 102.3* 101.9* 102.3* 105.3* 100.0  PLT 73* 78* 76* 69* 57*   Cardiac Enzymes: No results for input(s): CKTOTAL, CKMB, CKMBINDEX, TROPONINI in the last 168 hours. BNP: Invalid input(s): POCBNP CBG: No results for input(s): GLUCAP in the last 168 hours. D-Dimer No results for input(s): DDIMER in the last 72 hours. Hgb A1c No results for input(s): HGBA1C in the last 72 hours. Lipid Profile No results for input(s): CHOL, HDL, LDLCALC, TRIG, CHOLHDL, LDLDIRECT in the last 72 hours. Thyroid function studies No results for input(s): TSH, T4TOTAL, T3FREE, THYROIDAB in the last 72 hours.  Invalid input(s):  FREET3 Anemia work up No results for input(s): VITAMINB12, FOLATE, FERRITIN, TIBC, IRON, RETICCTPCT in the last 72 hours. Urinalysis    Component Value Date/Time   COLORURINE AMBER (A) 01/03/2021 0550   APPEARANCEUR CLEAR (A) 01/03/2021 0550   APPEARANCEUR Turbid 08/07/2012 0746   LABSPEC 1.009 01/03/2021 0550   LABSPEC 1.026 08/07/2012 0746   PHURINE 7.0 01/03/2021 0550   GLUCOSEU NEGATIVE 01/03/2021 0550   GLUCOSEU Negative 08/07/2012 0746   HGBUR NEGATIVE 01/03/2021 0550   BILIRUBINUR NEGATIVE 01/03/2021 0550   BILIRUBINUR Negative 08/07/2012 0746   KETONESUR NEGATIVE 01/03/2021 0550   PROTEINUR NEGATIVE 01/03/2021 0550  NITRITE NEGATIVE 01/03/2021 0550   LEUKOCYTESUR NEGATIVE 01/03/2021 0550   LEUKOCYTESUR Negative 08/07/2012 0746   Sepsis Labs Invalid input(s): PROCALCITONIN,  WBC,  LACTICIDVEN Microbiology Recent Results (from the past 240 hour(s))  Body fluid culture     Status: None (Preliminary result)   Collection Time: 01/02/21  8:45 AM   Specimen: Ascitic; Body Fluid  Result Value Ref Range Status   Specimen Description   Final    ASCITIC Performed at [facility], [address] [place], [place] [address]    Special Requests   Final    NONE Performed at [facility], [address] [place], [place] [address]    Gram Stain   Final    FEW WBC PRESENT, PREDOMINANTLY MONONUCLEAR NO ORGANISMS SEEN    Culture   Final    NO GROWTH 2 DAYS Performed at [facility], [address], [place], [place] [address]    Report Status PENDING  Incomplete  Body fluid culture w Gram Stain     Status: None   Collection Time: 01/02/21  8:45 AM   Specimen: Ascitic  Result Value Ref Range Status   Specimen Description   Final    ASCITIC Performed at [facility], [address] [place], [place] [address]    Special Requests   Final    NONE Performed at [facility], [address] [place], [place] [address]    Gram  Stain   Final    FEW WBC PRESENT, PREDOMINANTLY MONONUCLEAR NO ORGANISMS SEEN    Culture   Final    NO GROWTH 3 DAYS Performed at [facility], [address] [place], [place] [address]    Report Status 01/06/2021 FINAL  Final  Culture, blood (Routine x 2)     Status: None   Collection Time: 01/02/21  6:26 PM   Specimen: BLOOD  Result Value Ref Range Status   Specimen Description BLOOD RIGHT ANTECUBITAL  Final   Special Requests   Final    BOTTLES DRAWN AEROBIC AND ANAEROBIC Blood Culture adequate volume   Culture   Final    NO GROWTH 5 DAYS Performed at [facility], [address] [place], [place] [address]    Report Status 01/07/2021 FINAL  Final  Culture, blood (Routine x 2)     Status: None   Collection Time: 01/02/21  6:29 PM   Specimen: BLOOD  Result Value Ref Range Status   Specimen Description BLOOD RIGHT ANTECUBITAL  Final   Special Requests   Final    BOTTLES DRAWN AEROBIC AND ANAEROBIC Blood Culture adequate volume   Culture   Final    NO GROWTH 5 DAYS Performed at [facility], [address], [place], [place] [address]    Report Status 01/07/2021 FINAL  Final  SARS CORONAVIRUS 2 (TAT 6-24 HRS) Nasopharyngeal Nasopharyngeal Swab     Status: None   Collection Time: 01/02/21  7:50 PM   Specimen: Nasopharyngeal Swab  Result Value Ref Range Status   SARS Coronavirus 2 NEGATIVE NEGATIVE Final    Comment: (NOTE) SARS-CoV-2 target nucleic acids are NOT DETECTED.  The SARS-CoV-2 RNA is generally detectable in upper and lower respiratory specimens during the acute phase of infection. Negative results do not preclude SARS-CoV-2 infection, do not rule out co-infections with other pathogens, and should not be used as the sole basis for treatment or other patient management decisions. Negative results must be combined with clinical observations, patient  history, and epidemiological information. The expected result is Negative.  Fact Sheet  for Patients: HairSlick.no  Fact Sheet for Healthcare Providers: quierodirigir.com  This test is not yet approved or cleared by the [place] FDA and  has been authorized for detection and/or diagnosis of SARS-CoV-2 by FDA under an Emergency Use Authorization (EUA). This EUA will remain  in effect (meaning this test can be used) for the duration of the COVID-19 declaration under Se ction 564(b)(1) of the Act, 21 U.S.C. section 360bbb-3(b)(1), unless the authorization is terminated or revoked sooner.  Performed at [facility], [address], [place], [place] [address]   Body fluid culture w Gram Stain     Status: None (Preliminary result)   Collection Time: 01/09/21 11:00 AM   Specimen: PATH Cytology Peritoneal fluid  Result Value Ref Range Status   Specimen Description   Final    PERITONEAL Performed at [facility], [address] [place], [place] [address]    Special Requests   Final    NONE Performed at [facility], [address] [place], [place] [address]    Gram Stain NO WBC SEEN NO ORGANISMS SEEN   Final   Culture   Final    NO GROWTH 2 DAYS Performed at [facility], [address] [place], [place] [address]    Report Status PENDING  Incomplete  MRSA PCR Screening     Status: None   Collection Time: 01/09/21  5:40 PM   Specimen: Nasopharyngeal  Result Value Ref Range Status   MRSA by PCR NEGATIVE NEGATIVE Final    Comment:        The GeneXpert MRSA Assay (FDA approved for NASAL specimens only), is one component of a comprehensive MRSA colonization surveillance program. It is not intended to diagnose MRSA infection nor to guide or monitor treatment for MRSA infections. Performed at [facility], [address] [place], [place] [address]   CULTURE, BLOOD (ROUTINE X 2) w Reflex to ID Panel     Status: None (Preliminary result)   Collection Time:  01/10/21  3:56 PM   Specimen: BLOOD  Result Value Ref Range Status   Specimen Description BLOOD LEFT ARM  Final   Special Requests   Final    BOTTLES DRAWN AEROBIC AND ANAEROBIC Blood Culture results may not be optimal due to an excessive volume of blood received in culture bottles   Culture   Final    NO GROWTH < 24 HOURS Performed at [facility], [address], [place], [place] [address]    Report Status PENDING  Incomplete  CULTURE, BLOOD (ROUTINE X 2) w Reflex to ID Panel     Status: None (Preliminary result)   Collection Time: 01/10/21  3:58 PM   Specimen: BLOOD  Result Value Ref Range Status   Specimen Description BLOOD LEFT HAND  Final   Special Requests   Final    BOTTLES DRAWN AEROBIC AND ANAEROBIC Blood Culture results may not be optimal due to an excessive volume of blood received in culture bottles   Culture   Final    NO GROWTH < 24 HOURS Performed at [facility], [address] [place], [place] [address]    Report Status PENDING  Incomplete     Time coordinating discharge: 45 minutes The patient is critically ill with multi-organ failure.  Critical care was necessary to treat or prevent imminent or life-threatening deterioration of cardiac and liver failure and  was exclusive of separately billable procedures and treating other patients. Total critical care time spent by me: 45 minutes Time spent personally by me on obtaining history from patient or surrogate, evaluation of the patient, evaluation of patient's response to treatment, ordering and review of laboratory studies, ordering and review of radiographic studies, ordering and performing treatments and interventions, and re-evaluation of the patient's condition.       SIGNED:   Alberteen Sam, MD  [facility] 01/11/2021, 4:43 PM

## 2021-01-11 NOTE — Progress Notes (Signed)
*  PRELIMINARY RESULTS* Echocardiogram 2D Echocardiogram has been performed.  Lindsey Huang 01/11/2021, 1:26 PM

## 2021-01-12 LAB — CBC WITH DIFFERENTIAL/PLATELET
Abs Immature Granulocytes: 0.74 10*3/uL — ABNORMAL HIGH (ref 0.00–0.07)
Basophils Absolute: 0.1 10*3/uL (ref 0.0–0.1)
Basophils Relative: 1 %
Eosinophils Absolute: 0.5 10*3/uL (ref 0.0–0.5)
Eosinophils Relative: 5 %
HCT: 18.9 % — ABNORMAL LOW (ref 36.0–46.0)
Hemoglobin: 6.8 g/dL — ABNORMAL LOW (ref 12.0–15.0)
Immature Granulocytes: 7 %
Lymphocytes Relative: 7 %
Lymphs Abs: 0.8 10*3/uL (ref 0.7–4.0)
MCH: 36.4 pg — ABNORMAL HIGH (ref 26.0–34.0)
MCHC: 36 g/dL (ref 30.0–36.0)
MCV: 101.1 fL — ABNORMAL HIGH (ref 80.0–100.0)
Monocytes Absolute: 1.3 10*3/uL — ABNORMAL HIGH (ref 0.1–1.0)
Monocytes Relative: 12 %
Neutro Abs: 7.4 10*3/uL (ref 1.7–7.7)
Neutrophils Relative %: 68 %
Platelets: 53 10*3/uL — ABNORMAL LOW (ref 150–400)
RBC: 1.87 MIL/uL — ABNORMAL LOW (ref 3.87–5.11)
RDW: 22.9 % — ABNORMAL HIGH (ref 11.5–15.5)
Smear Review: NORMAL
WBC: 10.8 10*3/uL — ABNORMAL HIGH (ref 4.0–10.5)
nRBC: 0 % (ref 0.0–0.2)

## 2021-01-12 LAB — COMPREHENSIVE METABOLIC PANEL
ALT: 29 U/L (ref 0–44)
AST: 58 U/L — ABNORMAL HIGH (ref 15–41)
Albumin: 3.8 g/dL (ref 3.5–5.0)
Alkaline Phosphatase: 87 U/L (ref 38–126)
Anion gap: 7 (ref 5–15)
BUN: 34 mg/dL — ABNORMAL HIGH (ref 6–20)
CO2: 20 mmol/L — ABNORMAL LOW (ref 22–32)
Calcium: 9.1 mg/dL (ref 8.9–10.3)
Chloride: 91 mmol/L — ABNORMAL LOW (ref 98–111)
Creatinine, Ser: 0.59 mg/dL (ref 0.44–1.00)
GFR, Estimated: >60 mL/min (ref >60)
Glucose, Bld: 124 mg/dL — ABNORMAL HIGH (ref 70–99)
Potassium: 5 mmol/L (ref 3.5–5.1)
Sodium: 118 mmol/L — CL (ref 135–145)
Total Bilirubin: 12.7 mg/dL — ABNORMAL HIGH (ref 0.3–1.2)
Total Protein: 5.3 g/dL — ABNORMAL LOW (ref 6.5–8.1)

## 2021-01-12 LAB — PHOSPHORUS: Phosphorus: UNDETERMINED mg/dL (ref 2.5–4.6)

## 2021-01-12 LAB — BODY FLUID CULTURE W GRAM STAIN
Culture: NO GROWTH
Gram Stain: NONE SEEN

## 2021-01-12 LAB — AMMONIA: Ammonia: 71 umol/L — ABNORMAL HIGH (ref 9–35)

## 2021-01-12 LAB — GLUCOSE, CAPILLARY: Glucose-Capillary: 140 mg/dL — ABNORMAL HIGH (ref 70–99)

## 2021-01-12 LAB — PATHOLOGIST SMEAR REVIEW

## 2021-01-12 LAB — PREPARE RBC (CROSSMATCH)

## 2021-01-12 LAB — HEMOGLOBIN AND HEMATOCRIT, BLOOD
HCT: 22.2 % — ABNORMAL LOW (ref 36.0–46.0)
Hemoglobin: 7.7 g/dL — ABNORMAL LOW (ref 12.0–15.0)

## 2021-01-12 LAB — MAGNESIUM: Magnesium: 2.6 mg/dL — ABNORMAL HIGH (ref 1.7–2.4)

## 2021-01-12 MED ORDER — SODIUM CHLORIDE 0.9% IV SOLUTION: Freq: Once | INTRAVENOUS | Status: AC

## 2021-01-12 NOTE — Progress Notes (Addendum)
PROGRESS NOTE    Lindsey Huang  AVW:[id number] DOB: [birth year] DOA: 01/02/2021 PCP: Joya Martyr, MD   Brief Narrative:  This 40 y.o. 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 She has periodic outpatient paracentesis.  On day before admission, had an outpatient paracentesis, showed 517 nucleated cells, sent to ER. 2/12: Admitted, started on Rocephin 2/13 - 2/18: Patient had persistent abdominal distension, unclear if constipation or ascites.             -completed 5 days ceftriaxone and constipation resolved.             -but still symptomatic, increasingly encephalopathic  2/18: Repeat US paracentesis with 4L fluid.             -BP trending down to 80s/40s -> started on levophed.             -discussed with [facility] and hospitalist medicine, accepted for transfer for acute decompensated liver failure.  2/19: Transfused blood, additional albumin and levophed weaned off  2/20: Remained off Levophed.  2/21 ; Hemoglobin 8.6 getting 1 unit PRBC.     Assessment & Plan:   Active Problems:   SBP (spontaneous bacterial peritonitis) (HCC)   Decompensated cirrhosis: Alcoholic cirrhosis with ascites/portal hypertension, encephalopathy, thrombocytopenia Hepatic encephalopathy Hyponatremia, hypochloremia, and coagulopathy due to cirrhosis -Patient initially admitted and started on Rocephin for SBP on 2/12. -She had progressive weakness, malaise, abdominal distension/pain, and then confusion. -Due to progressive abdominal distension, had repeat paracentesis on 2/18, nucleated cell count improved to 82 from >500 prior to admission.   -After second paracentesis, patient got albumin 25g, but BP drifted down to 80s/40s and so Levophed was started peripherally and she was transferred to [facility]. -Blood cultures drawn and antibiotics broadened to Zosyn.  No new fever.  Culture on repeat  paracentesis and blood cultures no growth to date.    MELD 33.   -She remains encephalopathic, weak, jaundiced consistent with acute on chronic liver failure.  Discussed with Dr Allena Katz from [facility] transplant medicine 2/19 and Dr. Louie Bun from Internal medicine 2/20..    Na trending down.  Remains grossly overloaded on exam. -Continue Albumin 25 g TID today -Continue furosemide and Spironolactone  -Continue Lactulose and rifaximin -Daily BMP    Normocytic anemia Transfused 2/15 and 2/19 and 2/21 -Continue iron -Continue PPI    Spontaneous bacterial peritonitis Paracentesis 2/11 with negative culture but 517 nuc cells per mm3.  Treated with 5 days Rocephin. Paracentesis 2/18 with only 82 nuc cells/mm3, gram stain negative. Deteriorating in last 72 hours, Rocephin broadened to Zosyn empirically.  Cultures no growth, this could probably be stopped tomorrow if no growth at 48 hours.    -Continue Zosyn, day 3    DVT prophylaxis:  SCDs Code Status:  Full code. Family Communication:  No family at bedside. Disposition Plan:  Awaiting transfer to the [facility] for liver transplant.   Consultants:     Procedures: paracentesis Antimicrobials:   Anti-infectives (From admission, onward)   Start     Dose/Rate Route Frequency Ordered Stop   01/10/21 1630  piperacillin-tazobactam (ZOSYN) IVPB 3.375 g        3.375 g 12.5 mL/hr over 240 Minutes Intravenous Every 8 hours 01/10/21 1543     01/10/21 0000  piperacillin-tazobactam (ZOSYN) 3.375 GM/50ML IVPB        3.375 g Intravenous Every 8 hours 01/10/21 1657     01/10/21 0000  rifaximin (  XIFAXAN) 550 MG TABS tablet        550 mg Oral 2 times daily 01/10/21 1657     01/09/21 1600  cefTRIAXone (ROCEPHIN) 2 g in sodium chloride 0.9 % 100 mL IVPB  Status:  Discontinued        2 g 200 mL/hr over 30 Minutes Intravenous Every 24 hours 01/09/21 1334 01/10/21 1515   01/03/21 0700  cefTRIAXone (ROCEPHIN) 2 g in sodium chloride 0.9 % 100 mL  IVPB  Status:  Discontinued        2 g 200 mL/hr over 30 Minutes Intravenous Every 24 hours 01/03/21 0601 01/08/21 1210   01/02/21 2200  rifaximin (XIFAXAN) tablet 550 mg        550 mg Oral 2 times daily 01/02/21 2100     01/02/21 1930  cefTRIAXone (ROCEPHIN) 1 g in sodium chloride 0.9 % 100 mL IVPB        1 g 200 mL/hr over 30 Minutes Intravenous  Once 01/02/21 1915 01/02/21 2022      Subjective: Patient was seen and examined at bedside.  Overnight events noted.  She reports having abdominal pain with abdominal distention.  She is going to get blood transfusion for low hemoglobin.  Objective: Vitals:   01/12/21 1017 01/12/21 1141 01/12/21 1158 01/12/21 1519  BP: (!) 95/53 (!) 102/58 (!) 96/58 (!) 96/57  Pulse: 83 86 87 92  Resp:  20 20 16   Temp:  98 F (36.7 C) 97.9 F (36.6 C) 98.4 F (36.9 C)  TempSrc:  Oral Oral Oral  SpO2:  100% 99% 94%  Weight:      Height:        Intake/Output Summary (Last 24 hours) at 01/12/2021 1550 Last data filed at 01/12/2021 1515 Gross per 24 hour  Intake 1284 ml  Output 0 ml  Net 1284 ml   Filed Weights   01/02/21 2300 01/09/21 1731 01/12/21 0239  Weight: 66 kg 66.7 kg 70 kg    Examination:  General exam: Appears calm and comfortable.  Not in any acute distress. Respiratory system: Clear to auscultation. Respiratory effort normal. Cardiovascular system: S1 & S2 heard, RRR. No JVD, murmurs, rubs, gallops or clicks. No pedal edema. Gastrointestinal system: Abdomen is distended, soft and  Mildly tender. No organomegaly or masses felt. Normal bowel sounds heard. Central nervous system: Alert and oriented. No focal neurological deficits. Extremities: Symmetric 5 x 5 power. Skin: No rashes, lesions or ulcers Psychiatry: Judgement and insight appear normal. Mood & affect appropriate.     Data Reviewed: I have personally reviewed following labs and imaging studies  CBC: Recent Labs  Lab 01/08/21 0620 01/09/21 0516 01/10/21 0625  01/11/21 0534 01/12/21 0539  WBC 13.1* 12.3* 11.8* 11.2* 10.8*  NEUTROABS 8.9* 8.5* 8.3* 7.8* 7.4  HGB 7.6* 7.8* 6.9* 7.6* 6.8*  HCT 21.4* 21.9* 19.8* 20.8* 18.9*  MCV 101.9* 102.3* 105.3* 100.0 101.1*  PLT 78* 76* 69* 57* 53*   Basic Metabolic Panel: Recent Labs  Lab 01/08/21 0620 01/09/21 0516 01/09/21 1406 01/10/21 0625 01/10/21 1313 01/10/21 2143 01/11/21 0534 01/11/21 0804 01/11/21 1321 01/12/21 0539  NA 120* 119*   < > 121*   < > 119* 119* 118* 118* 118*  K 5.3* 5.3*  --  4.8  --   --  5.2*  --   --  5.0  CL 93* 94*  --  92*  --   --  91*  --   --  91*  CO2 21* 20*  --  20*  --   --  21*  --   --  20*  GLUCOSE 112* 140*  --  128*  --   --  125*  --   --  124*  BUN 13 17  --  23*  --   --  28*  --   --  34*  CREATININE 0.31* 0.33*  --  0.38*  --   --  0.33*  --   --  0.59  CALCIUM 8.7* 9.0  --  9.2  --   --  9.6  --   --  9.1  MG 2.1 2.1  --  2.2  --   --  2.7*  --   --  2.6*  PHOS UNABLE TO REPORT DUE TO ICTERUS SPECIMEN.  UNABLE TO RESULT DUE TO ICTERUS   --  UNABLE TO REPORT DUE TO ICTERUS  --   --  UNABLE TO REPORT DUE TO ICTERUS  --   --  UNABLE TO REPORT DUE TO ICTERUS   < > = values in this interval not displayed.   GFR: Estimated Creatinine Clearance: 78.7 mL/min (by C-G formula based on SCr of 0.59 mg/dL). Liver Function Tests: Recent Labs  Lab 01/08/21 0620 01/09/21 0516 01/10/21 0625 01/11/21 0534 01/12/21 0539  AST 74* 69* 69* 63* 58*  ALT 33 32 34 28 29  ALKPHOS 135* 137* 113 103 87  BILITOT 13.5* 12.2* 15.0* 15.6* 12.7*  PROT 5.2* 5.3* 5.4* 5.5* 5.3*  ALBUMIN 3.1* 3.3* 3.5 3.9 3.8   No results for input(s): LIPASE, AMYLASE in the last 168 hours. Recent Labs  Lab 01/08/21 0620 01/09/21 0516 01/10/21 0625 01/11/21 0534 01/12/21 0539  AMMONIA UNABLE TO REPORT DUE TO ICTERUS SAMPLE 70* 46* 63* 71*   Coagulation Profile: Recent Labs  Lab 01/11/21 0804  INR 2.8*   Cardiac Enzymes: No results for input(s): CKTOTAL, CKMB, CKMBINDEX,  TROPONINI in the last 168 hours. BNP (last 3 results) No results for input(s): PROBNP in the last 8760 hours. HbA1C: No results for input(s): HGBA1C in the last 72 hours. CBG: Recent Labs  Lab 01/09/21 1726  GLUCAP 140*   Lipid Profile: No results for input(s): CHOL, HDL, LDLCALC, TRIG, CHOLHDL, LDLDIRECT in the last 72 hours. Thyroid Function Tests: No results for input(s): TSH, T4TOTAL, FREET4, T3FREE, THYROIDAB in the last 72 hours. Anemia Panel: No results for input(s): VITAMINB12, FOLATE, FERRITIN, TIBC, IRON, RETICCTPCT in the last 72 hours. Sepsis Labs: No results for input(s): PROCALCITON, LATICACIDVEN in the last 168 hours.  Recent Results (from the past 240 hour(s))  Culture, blood (Routine x 2)     Status: None   Collection Time: 01/02/21  6:26 PM   Specimen: BLOOD  Result Value Ref Range Status   Specimen Description BLOOD RIGHT ANTECUBITAL  Final   Special Requests   Final    BOTTLES DRAWN AEROBIC AND ANAEROBIC Blood Culture adequate volume   Culture   Final    NO GROWTH 5 DAYS Performed at [facility], [address], [place], [place] [address]    Report Status 01/07/2021 FINAL  Final  Culture, blood (Routine x 2)     Status: None   Collection Time: 01/02/21  6:29 PM   Specimen: BLOOD  Result Value Ref Range Status   Specimen Description BLOOD RIGHT ANTECUBITAL  Final   Special Requests   Final    BOTTLES DRAWN AEROBIC AND ANAEROBIC Blood Culture adequate volume   Culture   Final  NO GROWTH 5 DAYS Performed at [facility], [address] [place], [place] [address]    Report Status 01/07/2021 FINAL  Final  SARS CORONAVIRUS 2 (TAT 6-24 HRS) Nasopharyngeal Nasopharyngeal Swab     Status: None   Collection Time: 01/02/21  7:50 PM   Specimen: Nasopharyngeal Swab  Result Value Ref Range Status   SARS Coronavirus 2 NEGATIVE NEGATIVE Final    Comment: (NOTE) SARS-CoV-2 target nucleic acids are NOT DETECTED.  The SARS-CoV-2 RNA is  generally detectable in upper and lower respiratory specimens during the acute phase of infection. Negative results do not preclude SARS-CoV-2 infection, do not rule out co-infections with other pathogens, and should not be used as the sole basis for treatment or other patient management decisions. Negative results must be combined with clinical observations, patient history, and epidemiological information. The expected result is Negative.  Fact Sheet for Patients: HairSlick.no  Fact Sheet for Healthcare Providers: quierodirigir.com  This test is not yet approved or cleared by the [place] FDA and  has been authorized for detection and/or diagnosis of SARS-CoV-2 by FDA under an Emergency Use Authorization (EUA). This EUA will remain  in effect (meaning this test can be used) for the duration of the COVID-19 declaration under Se ction 564(b)(1) of the Act, 21 U.S.C. section 360bbb-3(b)(1), unless the authorization is terminated or revoked sooner.  Performed at [facility], [address] [place], [place] [address]   Body fluid culture w Gram Stain     Status: None   Collection Time: 01/09/21 11:00 AM   Specimen: PATH Cytology Peritoneal fluid  Result Value Ref Range Status   Specimen Description   Final    PERITONEAL Performed at [facility], [address], [place], [place] [address]    Special Requests   Final    NONE Performed at [facility], [address] [place], [place] [address]    Gram Stain NO WBC SEEN NO ORGANISMS SEEN   Final   Culture   Final    NO GROWTH 3 DAYS Performed at [facility], [address], [place], [place] [address]    Report Status 01/12/2021 FINAL  Final  MRSA PCR Screening     Status: None   Collection Time: 01/09/21  5:40 PM   Specimen: Nasopharyngeal  Result Value Ref Range Status   MRSA by PCR NEGATIVE NEGATIVE Final    Comment:         The GeneXpert MRSA Assay (FDA approved for NASAL specimens only), is one component of a comprehensive MRSA colonization surveillance program. It is not intended to diagnose MRSA infection nor to guide or monitor treatment for MRSA infections. Performed at [facility], [address] [place], [place] [address]   CULTURE, BLOOD (ROUTINE X 2) w Reflex to ID Panel     Status: None (Preliminary result)   Collection Time: 01/10/21  3:56 PM   Specimen: BLOOD  Result Value Ref Range Status   Specimen Description BLOOD LEFT ARM  Final   Special Requests   Final    BOTTLES DRAWN AEROBIC AND ANAEROBIC Blood Culture results may not be optimal due to an excessive volume of blood received in culture bottles   Culture   Final    NO GROWTH 2 DAYS Performed at [facility], [address] [place], [place] [address]    Report Status PENDING  Incomplete  CULTURE, BLOOD (ROUTINE X 2) w Reflex to ID Panel  Status: None (Preliminary result)   Collection Time: 01/10/21  3:58 PM   Specimen: BLOOD  Result Value Ref Range Status   Specimen Description BLOOD LEFT HAND  Final   Special Requests   Final    BOTTLES DRAWN AEROBIC AND ANAEROBIC Blood Culture results may not be optimal due to an excessive volume of blood received in culture bottles   Culture   Final    NO GROWTH 2 DAYS Performed at [facility], [address] [place], [place] [id number]    Report Status PENDING  Incomplete     Radiology Studies: ECHOCARDIOGRAM COMPLETE  Result Date: 01/11/2021    ECHOCARDIOGRAM REPORT   Patient Name:   Fredderick SeveranceKELLY Tremblay Date of Exam: 01/11/2021 Medical Rec #:  [id number]    Height:       59.0 in Accession #:    [id number]   Weight:       147.0 lb Date of Birth:  06-Oct-1981   BSA:          1.618 m Patient Age:    39 years     BP:           92/79 mmHg Patient Gender: F            HR:           86 bpm. Exam Location:  [facility] Procedure: 2D Echo, Cardiac Doppler and Color Doppler  Indications:     Murmur 785.2 / R01.1  History:         Patient has no prior history of Echocardiogram examinations.  Sonographer:     Neysa BonitoChristy Roar Referring Phys:  [id number] CHRISTOPHER P DANFORD Diagnosing Phys: Adrian BlackwaterShaukat Khan MD IMPRESSIONS  1. Left ventricular ejection fraction, by estimation, is 60 to 65%. The left ventricle has normal function. The left ventricle has no regional wall motion abnormalities. There is mild concentric left ventricular hypertrophy. Left ventricular diastolic parameters were normal.  2. Right ventricular systolic function is mildly reduced. The right ventricular size is moderately enlarged.  3. Left atrial size was mild to moderately dilated.  4. Right atrial size was mildly dilated.  5. A small pericardial effusion is present. The pericardial effusion is posterior to the left ventricle.  6. The mitral valve is normal in structure. Mild to moderate mitral valve regurgitation. No evidence of mitral stenosis.  7. Tricuspid valve regurgitation is mild to moderate.  8. The aortic valve is normal in structure. Aortic valve regurgitation is not visualized. No aortic stenosis is present.  9. The inferior vena cava is normal in size with greater than 50% respiratory variability, suggesting right atrial pressure of 3 mmHg. FINDINGS  Left Ventricle: Left ventricular ejection fraction, by estimation, is 60 to 65%. The left ventricle has normal function. The left ventricle has no regional wall motion abnormalities. The left ventricular internal cavity size was normal in size. There is  mild concentric left ventricular hypertrophy. Left ventricular diastolic parameters were normal. Right Ventricle: The right ventricular size is moderately enlarged. No increase in right ventricular wall thickness. Right ventricular systolic function is mildly reduced. Left Atrium: Left atrial size was mild to moderately dilated. Right Atrium: Right atrial size was mildly dilated. Pericardium: A small pericardial  effusion is present. The pericardial effusion is posterior to the left ventricle. Mitral Valve: The mitral valve is normal in structure. Mild to moderate mitral valve regurgitation. No evidence of mitral valve stenosis. Tricuspid Valve: The tricuspid valve is normal in structure. Tricuspid valve regurgitation is mild  to moderate. No evidence of tricuspid stenosis. Aortic Valve: The aortic valve is normal in structure. Aortic valve regurgitation is not visualized. No aortic stenosis is present. Aortic valve mean gradient measures 10.5 mmHg. Aortic valve peak gradient measures 23.0 mmHg. Aortic valve area, by VTI measures 2.92 cm. Pulmonic Valve: The pulmonic valve was normal in structure. Pulmonic valve regurgitation is trivial. No evidence of pulmonic stenosis. Aorta: The aortic root is normal in size and structure. Venous: The inferior vena cava is normal in size with greater than 50% respiratory variability, suggesting right atrial pressure of 3 mmHg. IAS/Shunts: No atrial level shunt detected by color flow Doppler.  LEFT VENTRICLE PLAX 2D LVIDd:         4.38 cm  Diastology LVIDs:         2.87 cm  LV e' medial:    9.46 cm/s LV PW:         1.15 cm  LV E/e' medial:  11.3 LV IVS:        1.17 cm  LV e' lateral:   11.50 cm/s LVOT diam:     2.00 cm  LV E/e' lateral: 9.3 LV SV:         123 LV SV Index:   76 LVOT Area:     3.14 cm  RIGHT VENTRICLE RV Mid diam:    3.30 cm RV S prime:     16.20 cm/s TAPSE (M-mode): 2.6 cm LEFT ATRIUM             Index       RIGHT ATRIUM           Index LA diam:        4.30 cm 2.66 cm/m  RA Area:     15.50 cm LA Vol (A2C):   88.7 ml 54.81 ml/m RA Volume:   37.10 ml  22.92 ml/m LA Vol (A4C):   76.6 ml 47.33 ml/m LA Biplane Vol: 83.5 ml 51.60 ml/m  AORTIC VALVE                    PULMONIC VALVE AV Area (Vmax):    2.92 cm     PV Vmax:        1.53 m/s AV Area (Vmean):   2.80 cm     PV Peak grad:   9.4 mmHg AV Area (VTI):     2.92 cm     RVOT Peak grad: 6 mmHg AV Vmax:           240.00  cm/s AV Vmean:          144.500 cm/s AV VTI:            0.419 m AV Peak Grad:      23.0 mmHg AV Mean Grad:      10.5 mmHg LVOT Vmax:         223.00 cm/s LVOT Vmean:        129.000 cm/s LVOT VTI:          0.390 m LVOT/AV VTI ratio: 0.93  AORTA Ao Root diam: 2.80 cm MITRAL VALVE                TRICUSPID VALVE MV Area (PHT): 2.91 cm     TR Peak grad:   48.7 mmHg MV Decel Time: 261 msec     TR Vmax:        349.00 cm/s MV E velocity: 107.00 cm/s MV A velocity: 101.00 cm/s  SHUNTS MV E/A ratio:  1.06         Systemic VTI:  0.39 m MV A Prime:    13.5 cm/s    Systemic Diam: 2.00 cm Adrian Blackwater MD Electronically signed by Adrian Blackwater MD Signature Date/Time: 01/11/2021/7:20:13 PM    Final    Scheduled Meds: . bisacodyl  10 mg Rectal Once  . Chlorhexidine Gluconate Cloth  6 each Topical Daily  . feeding supplement  237 mL Oral BID BM  . ferrous sulfate  325 mg Oral Q breakfast  . folic acid  1 mg Oral Daily  . lactulose  30 g Oral TID  . midodrine  10 mg Oral TID WC  . multivitamin with minerals  1 tablet Oral Daily  . pantoprazole  40 mg Oral Daily  . potassium chloride  10 mEq Oral Daily  . rifaximin  550 mg Oral BID  . spironolactone  50 mg Oral Daily  . thiamine  100 mg Oral Daily   Continuous Infusions: . sodium chloride Stopped (01/07/21 1250)  . sodium chloride    . chlorproMAZINE (THORAZINE) IV    . piperacillin-tazobactam (ZOSYN)  IV 3.375 g (01/12/21 0840)     LOS: 10 days    Time spent: 25 mins.    Cipriano Bunker, MD [facility]   If 7PM-7AM, please contact night-coverage

## 2021-01-12 NOTE — Plan of Care (Signed)
Patient alert x4. Pt with no pain this shift. PIV's patent.  VSS and NAD. Intake and Output being measured. Pt updated on POC. Frequent observation, and no falls this shift. Pt awaiting bed at [facility] for liver transplant. See Assessment for details.     Problem: Education: Goal: Knowledge of General Education information will improve Description: Including pain rating scale, medication(s)/side effects and non-pharmacologic comfort measures Outcome: Progressing   Problem: Health Behavior/Discharge Planning: Goal: Ability to manage health-related needs will improve Outcome: Progressing   Problem: Clinical Measurements: Goal: Ability to maintain clinical measurements within normal limits will improve Outcome: Progressing Goal: Will remain free from infection Outcome: Progressing Goal: Diagnostic test results will improve Outcome: Progressing Goal: Respiratory complications will improve Outcome: Progressing Goal: Cardiovascular complication will be avoided Outcome: Progressing   Problem: Activity: Goal: Risk for activity intolerance will decrease Outcome: Progressing   Problem: Nutrition: Goal: Adequate nutrition will be maintained Outcome: Progressing   Problem: Coping: Goal: Level of anxiety will decrease Outcome: Progressing   Problem: Elimination: Goal: Will not experience complications related to bowel motility Outcome: Progressing Goal: Will not experience complications related to urinary retention Outcome: Progressing   Problem: Pain Managment: Goal: General experience of comfort will improve Outcome: Progressing   Problem: Safety: Goal: Ability to remain free from injury will improve Outcome: Progressing

## 2021-01-13 LAB — PH, BODY FLUID: pH, Body Fluid: 7.4

## 2021-01-13 LAB — CBC WITH DIFFERENTIAL/PLATELET
Abs Immature Granulocytes: 0.57 10*3/uL — ABNORMAL HIGH (ref 0.00–0.07)
Basophils Absolute: 0.1 10*3/uL (ref 0.0–0.1)
Basophils Relative: 1 %
Eosinophils Absolute: 0.4 10*3/uL (ref 0.0–0.5)
Eosinophils Relative: 4 %
HCT: 20.8 % — ABNORMAL LOW (ref 36.0–46.0)
Hemoglobin: 7.5 g/dL — ABNORMAL LOW (ref 12.0–15.0)
Immature Granulocytes: 6 %
Lymphocytes Relative: 7 %
Lymphs Abs: 0.7 10*3/uL (ref 0.7–4.0)
MCH: 35.5 pg — ABNORMAL HIGH (ref 26.0–34.0)
MCHC: 36.1 g/dL — ABNORMAL HIGH (ref 30.0–36.0)
MCV: 98.6 fL (ref 80.0–100.0)
Monocytes Absolute: 1.1 10*3/uL — ABNORMAL HIGH (ref 0.1–1.0)
Monocytes Relative: 12 %
Neutro Abs: 6.7 10*3/uL (ref 1.7–7.7)
Neutrophils Relative %: 70 %
Platelets: 50 10*3/uL — ABNORMAL LOW (ref 150–400)
RBC: 2.11 MIL/uL — ABNORMAL LOW (ref 3.87–5.11)
RDW: 23 % — ABNORMAL HIGH (ref 11.5–15.5)
Smear Review: NORMAL
WBC: 9.5 10*3/uL (ref 4.0–10.5)
nRBC: 0 % (ref 0.0–0.2)

## 2021-01-13 LAB — COMPREHENSIVE METABOLIC PANEL
ALT: 28 U/L (ref 0–44)
AST: 53 U/L — ABNORMAL HIGH (ref 15–41)
Albumin: 3.5 g/dL (ref 3.5–5.0)
Alkaline Phosphatase: 86 U/L (ref 38–126)
Anion gap: 6 (ref 5–15)
BUN: 35 mg/dL — ABNORMAL HIGH (ref 6–20)
CO2: 21 mmol/L — ABNORMAL LOW (ref 22–32)
Calcium: 9 mg/dL (ref 8.9–10.3)
Chloride: 91 mmol/L — ABNORMAL LOW (ref 98–111)
Creatinine, Ser: 0.45 mg/dL (ref 0.44–1.00)
GFR, Estimated: >60 mL/min (ref >60)
Glucose, Bld: 118 mg/dL — ABNORMAL HIGH (ref 70–99)
Potassium: 4.9 mmol/L (ref 3.5–5.1)
Sodium: 118 mmol/L — CL (ref 135–145)
Total Bilirubin: 11.7 mg/dL — ABNORMAL HIGH (ref 0.3–1.2)
Total Protein: 5 g/dL — ABNORMAL LOW (ref 6.5–8.1)

## 2021-01-13 LAB — BPAM RBC
Blood Product Expiration Date: 202203172359
Blood Product Expiration Date: 202203212359
ISSUE DATE / TIME: 202202191458
ISSUE DATE / TIME: 202202211128
Unit Type and Rh: 6200
Unit Type and Rh: 6200

## 2021-01-13 LAB — PHOSPHORUS: Phosphorus: UNDETERMINED mg/dL (ref 2.5–4.6)

## 2021-01-13 LAB — TYPE AND SCREEN
ABO/RH(D): A POS
Antibody Screen: NEGATIVE
Unit division: 0
Unit division: 0

## 2021-01-13 LAB — TRIGLYCERIDES, BODY FLUIDS: Triglycerides, Fluid: 47 mg/dL

## 2021-01-13 LAB — AMMONIA: Ammonia: 83 umol/L — ABNORMAL HIGH (ref 9–35)

## 2021-01-13 LAB — MAGNESIUM: Magnesium: 2.6 mg/dL — ABNORMAL HIGH (ref 1.7–2.4)

## 2021-01-13 MED ORDER — SODIUM CHLORIDE 0.9% FLUSH
10.0000 mL | Freq: Two times a day (BID) | INTRAVENOUS | Status: DC
  Administered 2021-01-13 – 2021-01-16 (×5): 10 mL via INTRAVENOUS

## 2021-01-13 NOTE — Progress Notes (Signed)
PROGRESS NOTE    Lindsey Huang  XTG:[id number] DOB: 03/21/1981 DOA: 01/02/2021 PCP: Joya Martyr, MD   Brief Narrative:  This 40 y.o. 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 She has periodic outpatient paracentesis.  On day before admission, had an outpatient paracentesis, showed 517 nucleated cells, sent to ER. 2/12: Admitted, started on Rocephin 2/13 - 2/18: Patient had persistent abdominal distension, unclear if constipation or ascites.             -completed 5 days ceftriaxone and constipation resolved.             -but still symptomatic, increasingly encephalopathic  2/18: Repeat US paracentesis with 4L fluid.             -BP trending down to 80s/40s -> started on levophed.             -discussed with [facility] and hospitalist medicine, accepted for transfer for acute decompensated liver failure.  2/19: Transfused blood, additional albumin and levophed weaned off  2/20: Remained off Levophed.  2/21 ; Hemoglobin 6.8 getting 1 unit PRBC.  2/22 : Hemoglobin remained stable,  she feels better,  still awaiting transfer to [facility].  Assessment & Plan:   Active Problems:   SBP (spontaneous bacterial peritonitis) (HCC)   Decompensated cirrhosis: Alcoholic cirrhosis with ascites/portal hypertension, encephalopathy, thrombocytopenia Hepatic encephalopathy Hyponatremia, hypochloremia, and coagulopathy due to cirrhosis -Patient initially admitted and started on Rocephin for SBP on 2/12. -She had progressive weakness, malaise, abdominal distension/pain, and then confusion. -Due to progressive abdominal distension, had repeat paracentesis on 2/18, nucleated cell count improved to 82 from >500 prior to admission.   -After second paracentesis, patient got albumin 25g, but BP drifted down to 80s/40s and so Levophed was started peripherally and she was transferred to [facility]. -Blood cultures  drawn and antibiotics broadened to Zosyn.  No new fever.  Culture on repeat paracentesis and blood cultures no growth to date.    MELD 33.   -She remains encephalopathic, weak, jaundiced consistent with acute on chronic liver failure.    Discussed with Dr Allena Katz from [facility] transplant medicine on 2/19 and Dr. Louie Bun from Internal medicine 2/20..    Na trending down.  Remains grossly overloaded on exam. -Continue Albumin 25 g TID today -Continue furosemide and Spironolactone  -Continue Lactulose and rifaximin -Daily BMP   Normocytic anemia Transfused 2/15 and 2/19 and 2/21 -Continue iron -Continue PPI Hb 7.5 on 2/22.    Spontaneous bacterial peritonitis Paracentesis 2/11 with negative culture but 517 nuc cells per mm3.  Treated with 5 days Rocephin. Paracentesis 2/18 with only 82 nuc cells/mm3, gram stain negative. Deteriorating in last 72 hours, Rocephin broadened to Zosyn empirically.   Cultures no growth, this could probably be stopped tomorrow if no growth at 48 hours.   Completed zosyn for three days.    DVT prophylaxis:  SCDs Code Status:  Full code. Family Communication:  No family at bedside. Disposition Plan:  Awaiting transfer to the [facility] for liver transplant.   Consultants:     Procedures: paracentesis  Antimicrobials:   Anti-infectives (From admission, onward)   Start     Dose/Rate Route Frequency Ordered Stop   01/10/21 1630  piperacillin-tazobactam (ZOSYN) IVPB 3.375 g        3.375 g 12.5 mL/hr over 240 Minutes Intravenous Every 8 hours 01/10/21 1543 01/14/21 2359   01/10/21 0000  piperacillin-tazobactam (ZOSYN) 3.375 GM/50ML IVPB  3.375 g Intravenous Every 8 hours 01/10/21 1657     01/10/21 0000  rifaximin (XIFAXAN) 550 MG TABS tablet        550 mg Oral 2 times daily 01/10/21 1657     01/09/21 1600  cefTRIAXone (ROCEPHIN) 2 g in sodium chloride 0.9 % 100 mL IVPB  Status:  Discontinued        2 g 200 mL/hr over 30 Minutes Intravenous  Every 24 hours 01/09/21 1334 01/10/21 1515   01/03/21 0700  cefTRIAXone (ROCEPHIN) 2 g in sodium chloride 0.9 % 100 mL IVPB  Status:  Discontinued        2 g 200 mL/hr over 30 Minutes Intravenous Every 24 hours 01/03/21 0601 01/08/21 1210   01/02/21 2200  rifaximin (XIFAXAN) tablet 550 mg        550 mg Oral 2 times daily 01/02/21 2100     01/02/21 1930  cefTRIAXone (ROCEPHIN) 1 g in sodium chloride 0.9 % 100 mL IVPB        1 g 200 mL/hr over 30 Minutes Intravenous  Once 01/02/21 1915 01/02/21 2022      Subjective: Patient was seen and examined at bedside.  Overnight events noted.  She reports feeling much better, still has abdominal pain with distention.  Her Hb remains stable, She has taken bath.  Objective: Vitals:   01/12/21 2012 01/13/21 0419 01/13/21 0819 01/13/21 1248  BP: (!) 96/57 (!) 82/45 (!) 85/52 110/65  Pulse: 83 91 85 86  Resp: 18 16 20 13   Temp: 97.8 F (36.6 C) 98.4 F (36.9 C) 98.2 F (36.8 C) 98 F (36.7 C)  TempSrc: Oral  Oral Oral  SpO2: 100% 97% 98% 97%  Weight:  70.4 kg    Height:        Intake/Output Summary (Last 24 hours) at 01/13/2021 1457 Last data filed at 01/13/2021 1330 Gross per 24 hour  Intake 1272.26 ml  Output -  Net 1272.26 ml   Filed Weights   01/09/21 1731 01/12/21 0239 01/13/21 0419  Weight: 66.7 kg 70 kg 70.4 kg    Examination:  General exam: Appears calm and comfortable.  Not in any acute distress. Respiratory system: Clear to auscultation. Respiratory effort normal. Cardiovascular system: S1 & S2 heard, RRR. No JVD, murmurs, rubs, gallops or clicks. No pedal edema. Gastrointestinal system: Abdomen is distended, soft and  Mildly tender. No organomegaly or masses felt. Normal bowel sounds heard. Central nervous system: Alert and oriented. No focal neurological deficits. Extremities: Symmetric 5 x 5 power. Skin: No rashes, lesions or ulcers Psychiatry: Judgement and insight appear normal. Mood & affect appropriate.     Data  Reviewed: I have personally reviewed following labs and imaging studies  CBC: Recent Labs  Lab 01/09/21 0516 01/10/21 0625 01/11/21 0534 01/12/21 0539 01/12/21 1916 01/13/21 0445  WBC 12.3* 11.8* 11.2* 10.8*  --  9.5  NEUTROABS 8.5* 8.3* 7.8* 7.4  --  6.7  HGB 7.8* 6.9* 7.6* 6.8* 7.7* 7.5*  HCT 21.9* 19.8* 20.8* 18.9* 22.2* 20.8*  MCV 102.3* 105.3* 100.0 101.1*  --  98.6  PLT 76* 69* 57* 53*  --  50*   Basic Metabolic Panel: Recent Labs  Lab 01/09/21 0516 01/09/21 1406 01/10/21 0625 01/10/21 1313 01/11/21 0534 01/11/21 0804 01/11/21 1321 01/12/21 0539 01/13/21 0445  NA 119*   < > 121*   < > 119* 118* 118* 118* 118*  K 5.3*  --  4.8  --  5.2*  --   --  5.0 4.9  CL 94*  --  92*  --  91*  --   --  91* 91*  CO2 20*  --  20*  --  21*  --   --  20* 21*  GLUCOSE 140*  --  128*  --  125*  --   --  124* 118*  BUN 17  --  23*  --  28*  --   --  34* 35*  CREATININE 0.33*  --  0.38*  --  0.33*  --   --  0.59 0.45  CALCIUM 9.0  --  9.2  --  9.6  --   --  9.1 9.0  MG 2.1  --  2.2  --  2.7*  --   --  2.6* 2.6*  PHOS  UNABLE TO RESULT DUE TO ICTERUS   --  UNABLE TO REPORT DUE TO ICTERUS  --  UNABLE TO REPORT DUE TO ICTERUS  --   --  UNABLE TO REPORT DUE TO ICTERUS UNABLE TO REPORT DUE TO ICTERUS   < > = values in this interval not displayed.   GFR: Estimated Creatinine Clearance: 79 mL/min (by C-G formula based on SCr of 0.45 mg/dL). Liver Function Tests: Recent Labs  Lab 01/09/21 0516 01/10/21 0625 01/11/21 0534 01/12/21 0539 01/13/21 0445  AST 69* 69* 63* 58* 53*  ALT 32 34 28 29 28   ALKPHOS 137* 113 103 87 86  BILITOT 12.2* 15.0* 15.6* 12.7* 11.7*  PROT 5.3* 5.4* 5.5* 5.3* 5.0*  ALBUMIN 3.3* 3.5 3.9 3.8 3.5   No results for input(s): LIPASE, AMYLASE in the last 168 hours. Recent Labs  Lab 01/09/21 0516 01/10/21 0625 01/11/21 0534 01/12/21 0539 01/13/21 0445  AMMONIA 70* 46* 63* 71* 83*   Coagulation Profile: Recent Labs  Lab 01/11/21 0804  INR 2.8*    Cardiac Enzymes: No results for input(s): CKTOTAL, CKMB, CKMBINDEX, TROPONINI in the last 168 hours. BNP (last 3 results) No results for input(s): PROBNP in the last 8760 hours. HbA1C: No results for input(s): HGBA1C in the last 72 hours. CBG: Recent Labs  Lab 01/09/21 1726  GLUCAP 140*   Lipid Profile: No results for input(s): CHOL, HDL, LDLCALC, TRIG, CHOLHDL, LDLDIRECT in the last 72 hours. Thyroid Function Tests: No results for input(s): TSH, T4TOTAL, FREET4, T3FREE, THYROIDAB in the last 72 hours. Anemia Panel: No results for input(s): VITAMINB12, FOLATE, FERRITIN, TIBC, IRON, RETICCTPCT in the last 72 hours. Sepsis Labs: No results for input(s): PROCALCITON, LATICACIDVEN in the last 168 hours.  Recent Results (from the past 240 hour(s))  Body fluid culture w Gram Stain     Status: None   Collection Time: 01/09/21 11:00 AM   Specimen: PATH Cytology Peritoneal fluid  Result Value Ref Range Status   Specimen Description   Final    PERITONEAL Performed at [facility], [address] [place], [place] [address]    Special Requests   Final    NONE Performed at [facility], [address] [place], [place] [address]    Gram Stain NO WBC SEEN NO ORGANISMS SEEN   Final   Culture   Final    NO GROWTH 3 DAYS Performed at [facility], [address] [place], [place] [address]    Report Status 01/12/2021 FINAL  Final  MRSA PCR Screening     Status: None   Collection Time: 01/09/21  5:40 PM   Specimen: Nasopharyngeal  Result Value Ref Range Status  MRSA by PCR NEGATIVE NEGATIVE Final    Comment:        The GeneXpert MRSA Assay (FDA approved for NASAL specimens only), is one component of a comprehensive MRSA colonization surveillance program. It is not intended to diagnose MRSA infection nor to guide or monitor treatment for MRSA infections. Performed at [facility], [address] [place], [place] [address]   CULTURE,  BLOOD (ROUTINE X 2) w Reflex to ID Panel     Status: None (Preliminary result)   Collection Time: 01/10/21  3:56 PM   Specimen: BLOOD  Result Value Ref Range Status   Specimen Description BLOOD LEFT ARM  Final   Special Requests   Final    BOTTLES DRAWN AEROBIC AND ANAEROBIC Blood Culture results may not be optimal due to an excessive volume of blood received in culture bottles   Culture   Final    NO GROWTH 3 DAYS Performed at [facility], [address], [place], [place] [address]    Report Status PENDING  Incomplete  CULTURE, BLOOD (ROUTINE X 2) w Reflex to ID Panel     Status: None (Preliminary result)   Collection Time: 01/10/21  3:58 PM   Specimen: BLOOD  Result Value Ref Range Status   Specimen Description BLOOD LEFT HAND  Final   Special Requests   Final    BOTTLES DRAWN AEROBIC AND ANAEROBIC Blood Culture results may not be optimal due to an excessive volume of blood received in culture bottles   Culture   Final    NO GROWTH 3 DAYS Performed at [facility], [address], [place], [place] [address]    Report Status PENDING  Incomplete     Radiology Studies: No results found. Scheduled Meds: . bisacodyl  10 mg Rectal Once  . Chlorhexidine Gluconate Cloth  6 each Topical Daily  . feeding supplement  237 mL Oral BID BM  . ferrous sulfate  325 mg Oral Q breakfast  . folic acid  1 mg Oral Daily  . lactulose  30 g Oral TID  . midodrine  10 mg Oral TID WC  . multivitamin with minerals  1 tablet Oral Daily  . pantoprazole  40 mg Oral Daily  . potassium chloride  10 mEq Oral Daily  . rifaximin  550 mg Oral BID  . spironolactone  50 mg Oral Daily  . thiamine  100 mg Oral Daily   Continuous Infusions: . sodium chloride 250 mL (01/13/21 1337)  . sodium chloride    . chlorproMAZINE (THORAZINE) IV    . piperacillin-tazobactam (ZOSYN)  IV 3.375 g (01/13/21 1338)     LOS: 11 days    Time spent: 25 mins.    Cipriano Bunker, MD [facility]   If 7PM-7AM, please contact night-coverage

## 2021-01-14 ENCOUNTER — Inpatient Hospital Stay: Payer: Medicaid Other

## 2021-01-14 LAB — CBC WITH DIFFERENTIAL/PLATELET
Abs Immature Granulocytes: 0.49 10*3/uL — ABNORMAL HIGH (ref 0.00–0.07)
Basophils Absolute: 0.1 10*3/uL (ref 0.0–0.1)
Basophils Relative: 1 %
Eosinophils Absolute: 0.3 10*3/uL (ref 0.0–0.5)
Eosinophils Relative: 4 %
HCT: 20.4 % — ABNORMAL LOW (ref 36.0–46.0)
Hemoglobin: 7.3 g/dL — ABNORMAL LOW (ref 12.0–15.0)
Immature Granulocytes: 6 %
Lymphocytes Relative: 7 %
Lymphs Abs: 0.6 10*3/uL — ABNORMAL LOW (ref 0.7–4.0)
MCH: 36.1 pg — ABNORMAL HIGH (ref 26.0–34.0)
MCHC: 35.8 g/dL (ref 30.0–36.0)
MCV: 101 fL — ABNORMAL HIGH (ref 80.0–100.0)
Monocytes Absolute: 1 10*3/uL (ref 0.1–1.0)
Monocytes Relative: 13 %
Neutro Abs: 5.8 10*3/uL (ref 1.7–7.7)
Neutrophils Relative %: 69 %
Platelets: 48 10*3/uL — ABNORMAL LOW (ref 150–400)
RBC: 2.02 MIL/uL — ABNORMAL LOW (ref 3.87–5.11)
RDW: 23.1 % — ABNORMAL HIGH (ref 11.5–15.5)
WBC: 8.3 10*3/uL (ref 4.0–10.5)
nRBC: 0 % (ref 0.0–0.2)

## 2021-01-14 LAB — COMPREHENSIVE METABOLIC PANEL
ALT: 29 U/L (ref 0–44)
AST: 55 U/L — ABNORMAL HIGH (ref 15–41)
Albumin: 3.4 g/dL — ABNORMAL LOW (ref 3.5–5.0)
Alkaline Phosphatase: 93 U/L (ref 38–126)
Anion gap: 8 (ref 5–15)
BUN: 33 mg/dL — ABNORMAL HIGH (ref 6–20)
CO2: 20 mmol/L — ABNORMAL LOW (ref 22–32)
Calcium: 9.1 mg/dL (ref 8.9–10.3)
Chloride: 94 mmol/L — ABNORMAL LOW (ref 98–111)
Creatinine, Ser: 0.49 mg/dL (ref 0.44–1.00)
GFR, Estimated: >60 mL/min (ref >60)
Glucose, Bld: 136 mg/dL — ABNORMAL HIGH (ref 70–99)
Potassium: 4.4 mmol/L (ref 3.5–5.1)
Sodium: 122 mmol/L — ABNORMAL LOW (ref 135–145)
Total Bilirubin: 15.9 mg/dL — ABNORMAL HIGH (ref 0.3–1.2)
Total Protein: 5 g/dL — ABNORMAL LOW (ref 6.5–8.1)

## 2021-01-14 LAB — PHOSPHORUS: Phosphorus: UNDETERMINED mg/dL (ref 2.5–4.6)

## 2021-01-14 LAB — MAGNESIUM: Magnesium: 2.5 mg/dL — ABNORMAL HIGH (ref 1.7–2.4)

## 2021-01-14 MED ORDER — ALBUMIN HUMAN 25 % IV SOLN
12.5000 g | Freq: Once | INTRAVENOUS | Status: AC
  Administered 2021-01-14: 12.5 g via INTRAVENOUS
  Filled 2021-01-14: qty 50

## 2021-01-14 NOTE — Plan of Care (Signed)
  Problem: Health Behavior/Discharge Planning: Goal: Ability to manage health-related needs will improve Outcome: Progressing   Problem: Pain Managment: Goal: General experience of comfort will improve Outcome: Progressing   Problem: Clinical Measurements: Goal: Ability to maintain clinical measurements within normal limits will improve Outcome: Not Progressing Goal: Will remain free from infection Outcome: Not Progressing

## 2021-01-14 NOTE — Progress Notes (Addendum)
PROGRESS NOTE    Lindsey SeveranceKelly Huang  ZOX:[id number] DOB: 1981/03/30 DOA: 01/02/2021 PCP: Joya MartyrSegovia, Maria Cristina, MD   Brief Narrative:  This 40 y.o. 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 She has periodic outpatient paracentesis.  On day before admission, had an outpatient paracentesis, showed 517 nucleated cells, sent to ER. 2/12: Admitted, started on Rocephin 2/13 - 2/18: Patient had persistent abdominal distension, unclear if constipation or ascites.             -completed 5 days ceftriaxone and constipation resolved.             -but still symptomatic, increasingly encephalopathic  2/18: Repeat US paracentesis with 4L fluid.             -BP trending down to 80s/40s -> started on levophed.             -discussed with [facility] and hospitalist medicine, accepted for transfer for acute decompensated liver failure.  2/19: Transfused blood, additional albumin and levophed weaned off  2/20: Remained off Levophed.  2/21 ; Hemoglobin 6.8 getting 1 unit PRBC.  2/22-23 : Hemoglobin remained stable,  She feels better,  [facility] Scheduled to have repeat paracentesis today.  Assessment & Plan:   Active Problems:   SBP (spontaneous bacterial peritonitis) (HCC)   Decompensated cirrhosis: Alcoholic cirrhosis with ascites/portal hypertension, encephalopathy, thrombocytopenia Hepatic encephalopathy Hyponatremia, hypochloremia, and coagulopathy due to cirrhosis -Patient initially admitted and started on Rocephin for SBP on 2/12. -She had progressive weakness, malaise, abdominal distension/pain, and then confusion. -Due to progressive abdominal distension, had repeat paracentesis on 2/18, nucleated cell count improved to 82 from >500 prior to admission.   -After second paracentesis, patient got albumin 25g, but BP drifted down to 80s/40s and so Levophed was started peripherally and  [facility] -Blood cultures drawn and antibiotics broadened to Zosyn.  No new fever.  Culture on repeat paracentesis and blood cultures no growth to date.    MELD 33.   -She remains encephalopathic, weak, jaundiced consistent with acute on chronic liver failure.    [facility] and Dr. Louie Bunhudgar from Internal medicine 2/20..    Na slightly improved .  Remains grossly overloaded on exam. -Continue Albumin 25 g TID today -Continue furosemide and Spironolactone  -Continue Lactulose and rifaximin -Daily BMP - Scheduled to have repeat paracentesis today.   Normocytic anemia Transfused 2/15 and 2/19 and 2/21 -Continue iron -Continue PPI Hb 7.5 on 2/22.    Spontaneous bacterial peritonitis Paracentesis 2/11 with negative culture but 517 nuc cells per mm3.  Treated with 5 days Rocephin. Paracentesis 2/18 with only 82 nuc cells/mm3, gram stain negative. Deteriorating in last 72 hours, Rocephin broadened to Zosyn empirically.   Cultures no growth, this could probably be stopped tomorrow if no growth at 48 hours.   Completed zosyn for three days.  Hypotension : BP remains on lower side. Continue midodrine TID.  DVT prophylaxis:  SCDs Code Status:  Full code. Family Communication:  No family at bedside. Disposition Plan:  Awaiting transfer to the [facility] for liver transplant.   Consultants:   None  Procedures: paracentesis  Antimicrobials:   Anti-infectives (From admission, onward)   Start     Dose/Rate Route Frequency Ordered Stop   01/10/21 1630  piperacillin-tazobactam (ZOSYN) IVPB 3.375 g        3.375 g 12.5 mL/hr  over 240 Minutes Intravenous Every 8 hours 01/10/21 1543 01/14/21 2359   01/10/21 0000  piperacillin-tazobactam (ZOSYN) 3.375 GM/50ML IVPB        3.375 g Intravenous Every 8 hours 01/10/21 1657     01/10/21 0000  rifaximin (XIFAXAN) 550 MG TABS tablet        550 mg Oral 2 times daily  01/10/21 1657     01/09/21 1600  cefTRIAXone (ROCEPHIN) 2 g in sodium chloride 0.9 % 100 mL IVPB  Status:  Discontinued        2 g 200 mL/hr over 30 Minutes Intravenous Every 24 hours 01/09/21 1334 01/10/21 1515   01/03/21 0700  cefTRIAXone (ROCEPHIN) 2 g in sodium chloride 0.9 % 100 mL IVPB  Status:  Discontinued        2 g 200 mL/hr over 30 Minutes Intravenous Every 24 hours 01/03/21 0601 01/08/21 1210   01/02/21 2200  rifaximin (XIFAXAN) tablet 550 mg        550 mg Oral 2 times daily 01/02/21 2100     01/02/21 1930  cefTRIAXone (ROCEPHIN) 1 g in sodium chloride 0.9 % 100 mL IVPB        1 g 200 mL/hr over 30 Minutes Intravenous  Once 01/02/21 1915 01/02/21 2022      Subjective: Patient was seen and examined at bedside.  Overnight events noted.   She reports feeling much better, still has abdominal pain with distention.  She wants to have paracentesis, states her abdomen is distended and hurts. Her Hb remains stable.  Objective: Vitals:   01/13/21 2037 01/14/21 0416 01/14/21 0914 01/14/21 1127  BP: (!) 93/47 100/61 (!) 90/55 (!) 96/55  Pulse: 83 91 81 81  Resp: 20 20 18    Temp: 97.9 F (36.6 C) 98.3 F (36.8 C) 97.6 F (36.4 C) 97.9 F (36.6 C)  TempSrc: Oral Oral Oral Oral  SpO2: 100% 99% 100%   Weight:  72.2 kg    Height:        Intake/Output Summary (Last 24 hours) at 01/14/2021 1449 Last data filed at 01/14/2021 1337 Gross per 24 hour  Intake 334.84 ml  Output 0 ml  Net 334.84 ml   Filed Weights   01/12/21 0239 01/13/21 0419 01/14/21 0416  Weight: 70 kg 70.4 kg 72.2 kg    Examination:  General exam: Appears calm and comfortable.  Not in any acute distress. Respiratory system: Clear to auscultation. Respiratory effort normal. Cardiovascular system: S1 & S2 heard, RRR. No JVD, murmurs, rubs, gallops or clicks. No pedal edema. Gastrointestinal system: Abdomen is distended, soft and  Mildly tender. No organomegaly or masses felt. Normal bowel sounds  heard. Central nervous system: Alert and oriented. No focal neurological deficits. Extremities: Symmetric 5 x 5 power. Skin: No rashes, lesions or ulcers Psychiatry: Judgement and insight appear normal. Mood & affect appropriate.     Data Reviewed: I have personally reviewed following labs and imaging studies  CBC: Recent Labs  Lab 01/10/21 0625 01/11/21 0534 01/12/21 0539 01/12/21 1916 01/13/21 0445 01/14/21 0446  WBC 11.8* 11.2* 10.8*  --  9.5 8.3  NEUTROABS 8.3* 7.8* 7.4  --  6.7 5.8  HGB 6.9* 7.6* 6.8* 7.7* 7.5* 7.3*  HCT 19.8* 20.8* 18.9* 22.2* 20.8* 20.4*  MCV 105.3* 100.0 101.1*  --  98.6 101.0*  PLT 69* 57* 53*  --  50* 48*   Basic Metabolic Panel: Recent Labs  Lab 01/10/21 0625 01/10/21 1313 01/11/21 0534 01/11/21 0804 01/11/21 1321 01/12/21 0539 01/13/21  0445 01/14/21 0446  NA 121*   < > 119* 118* 118* 118* 118* 122*  K 4.8  --  5.2*  --   --  5.0 4.9 4.4  CL 92*  --  91*  --   --  91* 91* 94*  CO2 20*  --  21*  --   --  20* 21* 20*  GLUCOSE 128*  --  125*  --   --  124* 118* 136*  BUN 23*  --  28*  --   --  34* 35* 33*  CREATININE 0.38*  --  0.33*  --   --  0.59 0.45 0.49  CALCIUM 9.2  --  9.6  --   --  9.1 9.0 9.1  MG 2.2  --  2.7*  --   --  2.6* 2.6* 2.5*  PHOS UNABLE TO REPORT DUE TO ICTERUS  --  UNABLE TO REPORT DUE TO ICTERUS  --   --  UNABLE TO REPORT DUE TO ICTERUS UNABLE TO REPORT DUE TO ICTERUS UNABLE TO REPORT DUE TO ICTERIC INTERFERENCE SDR   < > = values in this interval not displayed.   GFR: Estimated Creatinine Clearance: 80.1 mL/min (by C-G formula based on SCr of 0.49 mg/dL). Liver Function Tests: Recent Labs  Lab 01/10/21 0625 01/11/21 0534 01/12/21 0539 01/13/21 0445 01/14/21 0446  AST 69* 63* 58* 53* 55*  ALT 34 28 29 28 29   ALKPHOS 113 103 87 86 93  BILITOT 15.0* 15.6* 12.7* 11.7* 15.9*  PROT 5.4* 5.5* 5.3* 5.0* 5.0*  ALBUMIN 3.5 3.9 3.8 3.5 3.4*   No results for input(s): LIPASE, AMYLASE in the last 168 hours. Recent  Labs  Lab 01/09/21 0516 01/10/21 0625 01/11/21 0534 01/12/21 0539 01/13/21 0445  AMMONIA 70* 46* 63* 71* 83*   Coagulation Profile: Recent Labs  Lab 01/11/21 0804  INR 2.8*   Cardiac Enzymes: No results for input(s): CKTOTAL, CKMB, CKMBINDEX, TROPONINI in the last 168 hours. BNP (last 3 results) No results for input(s): PROBNP in the last 8760 hours. HbA1C: No results for input(s): HGBA1C in the last 72 hours. CBG: Recent Labs  Lab 01/09/21 1726  GLUCAP 140*   Lipid Profile: No results for input(s): CHOL, HDL, LDLCALC, TRIG, CHOLHDL, LDLDIRECT in the last 72 hours. Thyroid Function Tests: No results for input(s): TSH, T4TOTAL, FREET4, T3FREE, THYROIDAB in the last 72 hours. Anemia Panel: No results for input(s): VITAMINB12, FOLATE, FERRITIN, TIBC, IRON, RETICCTPCT in the last 72 hours. Sepsis Labs: No results for input(s): PROCALCITON, LATICACIDVEN in the last 168 hours.  Recent Results (from the past 240 hour(s))  Body fluid culture w Gram Stain     Status: None   Collection Time: 01/09/21 11:00 AM   Specimen: PATH Cytology Peritoneal fluid  Result Value Ref Range Status   Specimen Description   Final    PERITONEAL Performed at [facility], [address] [place], [place]    Special Requests   Final    NONE Performed at [facility], [address] [place], [place]    Gram Stain NO WBC SEEN NO ORGANISMS SEEN   Final   Culture   Final    NO GROWTH 3 DAYS Performed at [facility], [address], [place], [place]    Report Status 01/12/2021 FINAL  Final  MRSA PCR Screening     Status: None   Collection Time: 01/09/21  5:40 PM   Specimen: Nasopharyngeal  Result Value Ref Range Status   MRSA by PCR NEGATIVE NEGATIVE Final    Comment:        The GeneXpert MRSA Assay (FDA approved for NASAL specimens only), is one component of a comprehensive MRSA colonization surveillance program. It is  not intended to diagnose MRSA infection nor to guide or monitor treatment for MRSA infections. Performed at [facility], [address] [place], [place] [address]   CULTURE, BLOOD (ROUTINE X 2) w Reflex to ID Panel     Status: None (Preliminary result)   Collection Time: 01/10/21  3:56 PM   Specimen: BLOOD  Result Value Ref Range Status   Specimen Description BLOOD LEFT ARM  Final   Special Requests   Final    BOTTLES DRAWN AEROBIC AND ANAEROBIC Blood Culture results may not be optimal due to an excessive volume of blood received in culture bottles   Culture   Final    NO GROWTH 4 DAYS Performed at [facility], [address], [place], [place] [address]    Report Status PENDING  Incomplete  CULTURE, BLOOD (ROUTINE X 2) w Reflex to ID Panel     Status: None (Preliminary result)   Collection Time: 01/10/21  3:58 PM   Specimen: BLOOD  Result Value Ref Range Status   Specimen Description BLOOD LEFT HAND  Final   Special Requests   Final    BOTTLES DRAWN AEROBIC AND ANAEROBIC Blood Culture results may not be optimal due to an excessive volume of blood received in culture bottles   Culture   Final    NO GROWTH 4 DAYS Performed at [facility], [address], [place], [place] [address]    Report Status PENDING  Incomplete     Radiology Studies: No results found. Scheduled Meds: . bisacodyl  10 mg Rectal Once  . feeding supplement  237 mL Oral BID BM  . ferrous sulfate  325 mg Oral Q breakfast  . folic acid  1 mg Oral Daily  . lactulose  30 g Oral TID  . midodrine  10 mg Oral TID WC  . multivitamin with minerals  1 tablet Oral Daily  . pantoprazole  40 mg Oral Daily  . potassium chloride  10 mEq Oral Daily  . rifaximin  550 mg Oral BID  . sodium chloride flush  10 mL Intravenous Q12H  . spironolactone  50 mg Oral Daily  . thiamine  100 mg Oral Daily   Continuous Infusions: . sodium chloride 250 mL (01/13/21 2145)  . sodium chloride    .  chlorproMAZINE (THORAZINE) IV    . piperacillin-tazobactam (ZOSYN)  IV 3.375 g (01/14/21 0615)     LOS: 12 days    Time spent: 25 mins.    Cipriano Bunker, MD [facility]   If 7PM-7AM, please contact night-coverage

## 2021-01-14 NOTE — Procedures (Signed)
Interventional Radiology Procedure Note  Procedure: US guided paracentesis.  Complications: None Recommendations:    - Do not submerge for 7 days    Signed,  Yvone Neu. Loreta Ave, DO

## 2021-01-15 LAB — COMPREHENSIVE METABOLIC PANEL
ALT: 28 U/L (ref 0–44)
AST: 54 U/L — ABNORMAL HIGH (ref 15–41)
Albumin: 3.2 g/dL — ABNORMAL LOW (ref 3.5–5.0)
Alkaline Phosphatase: 86 U/L (ref 38–126)
Anion gap: 7 (ref 5–15)
BUN: 35 mg/dL — ABNORMAL HIGH (ref 6–20)
CO2: 22 mmol/L (ref 22–32)
Calcium: 9 mg/dL (ref 8.9–10.3)
Chloride: 93 mmol/L — ABNORMAL LOW (ref 98–111)
Creatinine, Ser: 0.51 mg/dL (ref 0.44–1.00)
GFR, Estimated: >60 mL/min (ref >60)
Glucose, Bld: 128 mg/dL — ABNORMAL HIGH (ref 70–99)
Potassium: 4.7 mmol/L (ref 3.5–5.1)
Sodium: 122 mmol/L — ABNORMAL LOW (ref 135–145)
Total Bilirubin: 15 mg/dL — ABNORMAL HIGH (ref 0.3–1.2)
Total Protein: 4.8 g/dL — ABNORMAL LOW (ref 6.5–8.1)

## 2021-01-15 LAB — CBC WITH DIFFERENTIAL/PLATELET
Abs Immature Granulocytes: 0.44 10*3/uL — ABNORMAL HIGH (ref 0.00–0.07)
Basophils Absolute: 0.1 10*3/uL (ref 0.0–0.1)
Basophils Relative: 1 %
Eosinophils Absolute: 0.4 10*3/uL (ref 0.0–0.5)
Eosinophils Relative: 4 %
HCT: 20.9 % — ABNORMAL LOW (ref 36.0–46.0)
Hemoglobin: 7.2 g/dL — ABNORMAL LOW (ref 12.0–15.0)
Immature Granulocytes: 5 %
Lymphocytes Relative: 9 %
Lymphs Abs: 0.7 10*3/uL (ref 0.7–4.0)
MCH: 35.6 pg — ABNORMAL HIGH (ref 26.0–34.0)
MCHC: 34.4 g/dL (ref 30.0–36.0)
MCV: 103.5 fL — ABNORMAL HIGH (ref 80.0–100.0)
Monocytes Absolute: 1 10*3/uL (ref 0.1–1.0)
Monocytes Relative: 13 %
Neutro Abs: 5.6 10*3/uL (ref 1.7–7.7)
Neutrophils Relative %: 68 %
Platelets: 48 10*3/uL — ABNORMAL LOW (ref 150–400)
RBC: 2.02 MIL/uL — ABNORMAL LOW (ref 3.87–5.11)
RDW: 22.8 % — ABNORMAL HIGH (ref 11.5–15.5)
Smear Review: NORMAL
WBC: 8.2 10*3/uL (ref 4.0–10.5)
nRBC: 0 % (ref 0.0–0.2)

## 2021-01-15 LAB — CULTURE, BLOOD (ROUTINE X 2)
Culture: NO GROWTH
Culture: NO GROWTH

## 2021-01-15 LAB — PHOSPHORUS: Phosphorus: UNDETERMINED mg/dL (ref 2.5–4.6)

## 2021-01-15 LAB — MAGNESIUM: Magnesium: 2.8 mg/dL — ABNORMAL HIGH (ref 1.7–2.4)

## 2021-01-15 MED ORDER — ALBUMIN HUMAN 25 % IV SOLN
12.5000 g | Freq: Once | INTRAVENOUS | Status: AC
  Administered 2021-01-15: 12.5 g via INTRAVENOUS
  Filled 2021-01-15: qty 50

## 2021-01-15 MED ORDER — ALBUMIN HUMAN 25 % IV SOLN
50.0000 g | Freq: Once | INTRAVENOUS | Status: AC
  Administered 2021-01-15: 50 g via INTRAVENOUS
  Filled 2021-01-15: qty 200

## 2021-01-15 MED ORDER — ALBUMIN HUMAN 25 % IV SOLN
25.0000 g | Freq: Once | INTRAVENOUS | Status: AC
  Administered 2021-01-15: 25 g via INTRAVENOUS

## 2021-01-15 MED ORDER — ALBUMIN HUMAN 25 % IV SOLN
25.0000 g | Freq: Four times a day (QID) | INTRAVENOUS | Status: DC | PRN
  Administered 2021-01-15: 25 g via INTRAVENOUS
  Administered 2021-01-16: 12.5 g via INTRAVENOUS

## 2021-01-15 MED ORDER — ALBUMIN HUMAN 25 % IV SOLN
12.5000 g | Freq: Once | INTRAVENOUS | Status: DC
  Filled 2021-01-15 (×2): qty 50

## 2021-01-15 NOTE — Plan of Care (Signed)
  Problem: Elimination: Goal: Will not experience complications related to urinary retention Outcome: Progressing Voiding in sufficient quantities this shift.   Problem: Pain Managment: Goal: General experience of comfort will improve Outcome: Progressing  Denies abdominal pain this shift.

## 2021-01-15 NOTE — Progress Notes (Signed)
PROGRESS NOTE    Lindsey Huang  WJX:[id number] DOB: 09/26/1981 DOA: 01/02/2021 PCP: Joya MartyrSegovia, Maria Cristina, MD   Brief Narrative:  This 40 y.o. 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 She has periodic outpatient paracentesis.  On day before admission, had an outpatient paracentesis, showed 517 nucleated cells, sent to ER. 2/12: Admitted, started on Rocephin 2/13 - 2/18: Patient had persistent abdominal distension, unclear if constipation or ascites.             -completed 5 days ceftriaxone and constipation resolved.             -but still symptomatic, increasingly encephalopathic  2/18: Repeat US paracentesis with 4L fluid.             -BP trending down to 80s/40s -> started on levophed.             -discussed with [facility] and hospitalist medicine, accepted for transfer for acute decompensated liver failure.  2/19: Transfused blood, additional albumin and levophed weaned off  2/20: Remained off Levophed.  2/21 ; Hemoglobin 6.8 getting 1 unit PRBC.  2/22-23 : Hemoglobin remained stable,  She feels better,  [facility] 2/23: Underwent repeat paracentesis by IR 5.6 L of clear fluid drained. 2/24: IV albumin infusion given.  Assessment & Plan:   Active Problems:   SBP (spontaneous bacterial peritonitis) (HCC)   Decompensated cirrhosis: Alcoholic cirrhosis with ascites/portal hypertension, encephalopathy, thrombocytopenia Hepatic encephalopathy Hyponatremia, hypochloremia, and coagulopathy due to cirrhosis -Patient initially admitted and started on Rocephin for SBP on 2/12. -She had progressive weakness, malaise, abdominal distension/pain, and then confusion. -Due to progressive abdominal distension, had repeat paracentesis on 2/18, nucleated cell count improved to 82 from >500 prior to admission.   -After second paracentesis, patient got albumin 25g, but BP drifted  down to 80s/40s and so Levophed was started peripherally and [facility] -Blood cultures drawn and antibiotics broadened to Zosyn.  No new fever.  Culture on repeat paracentesis and blood cultures no growth to date.    MELD 33.   -She remains encephalopathic, weak, jaundiced consistent with acute on chronic liver failure.    [facility] 2/19 and Dr. Louie Bunhudgar from Internal medicine 2/20..    Na slightly improved .  Remains grossly overloaded on exam. -Continue Albumin 25 g TID today -Continue furosemide and Spironolactone  -Continue Lactulose and rifaximin -Daily BMP -Underwent repeat paracentesis by IR 5.6 L of clear fluid drained. -Patient is getting albumin infusion today.   Normocytic anemia Transfused 2/15 and 2/19 and 2/21 -Continue iron -Continue PPI Hb 7.5 on 2/22.    Spontaneous bacterial peritonitis Paracentesis 2/11 with negative culture but 517 nuc cells per mm3.  Treated with 5 days Rocephin. Paracentesis 2/18 with only 82 nuc cells/mm3, gram stain negative. Deteriorating in last 72 hours, Rocephin broadened to Zosyn empirically.   Cultures no growth, this could probably be stopped tomorrow if no growth at 48 hours.   Completed zosyn for three days.  Hypotension : BP remains on lower side. Continue midodrine TID.  DVT prophylaxis:  SCDs Code Status:  Full code. Family Communication:  No family at bedside. Disposition Plan:  Awaiting transfer to the [facility] for liver transplant.   Consultants:   None  Procedures: paracentesis  Antimicrobials:   Anti-infectives (From admission, onward)   Start     Dose/Rate Route Frequency Ordered Stop  01/10/21 1630  piperacillin-tazobactam (ZOSYN) IVPB 3.375 g        3.375 g 12.5 mL/hr over 240 Minutes Intravenous Every 8 hours 01/10/21 1543 01/14/21 2359   01/10/21 0000  piperacillin-tazobactam (ZOSYN) 3.375 GM/50ML IVPB        3.375 g  Intravenous Every 8 hours 01/10/21 1657     01/10/21 0000  rifaximin (XIFAXAN) 550 MG TABS tablet        550 mg Oral 2 times daily 01/10/21 1657     01/09/21 1600  cefTRIAXone (ROCEPHIN) 2 g in sodium chloride 0.9 % 100 mL IVPB  Status:  Discontinued        2 g 200 mL/hr over 30 Minutes Intravenous Every 24 hours 01/09/21 1334 01/10/21 1515   01/03/21 0700  cefTRIAXone (ROCEPHIN) 2 g in sodium chloride 0.9 % 100 mL IVPB  Status:  Discontinued        2 g 200 mL/hr over 30 Minutes Intravenous Every 24 hours 01/03/21 0601 01/08/21 1210   01/02/21 2200  rifaximin (XIFAXAN) tablet 550 mg        550 mg Oral 2 times daily 01/02/21 2100     01/02/21 1930  cefTRIAXone (ROCEPHIN) 1 g in sodium chloride 0.9 % 100 mL IVPB        1 g 200 mL/hr over 30 Minutes Intravenous  Once 01/02/21 1915 01/02/21 2022      Subjective: Patient was seen and examined at bedside.  Overnight events noted.   She reports feeling much better after having repeat paracentesis.  She tolerated well. Her Hb remains stable.  He denies any bleeding.  Abdominal pain has improved.  Objective: Vitals:   01/15/21 0444 01/15/21 0500 01/15/21 0856 01/15/21 1205  BP: 99/65  (!) 91/54 (!) 84/46  Pulse: 79  83 84  Resp: 16  18 18   Temp: 98.6 F (37 C)  98.3 F (36.8 C) 97.6 F (36.4 C)  TempSrc: Oral  Oral   SpO2: 98%  99% 98%  Weight:  71.8 kg    Height:        Intake/Output Summary (Last 24 hours) at 01/15/2021 1229 Last data filed at 01/15/2021 1204 Gross per 24 hour  Intake 308.65 ml  Output 800 ml  Net -491.35 ml   Filed Weights   01/13/21 0419 01/14/21 0416 01/15/21 0500  Weight: 70.4 kg 72.2 kg 71.8 kg    Examination:  General exam: Appears calm and comfortable.  Not in any acute distress. Respiratory system: Clear to auscultation. Respiratory effort normal. Cardiovascular system: S1 & S2 heard, RRR. No JVD, murmurs, rubs, gallops or clicks. No pedal edema. Gastrointestinal system: Abdomen is distended,  soft and  Mildly tender. No organomegaly or masses felt. Normal bowel sounds heard. Central nervous system: Alert and oriented. No focal neurological deficits. Extremities: Symmetric 5 x 5 power. Skin: No rashes, lesions or ulcers Psychiatry: Judgement and insight appear normal. Mood & affect appropriate.     Data Reviewed: I have personally reviewed following labs and imaging studies  CBC: Recent Labs  Lab 01/11/21 0534 01/12/21 0539 01/12/21 1916 01/13/21 0445 01/14/21 0446 01/15/21 0615  WBC 11.2* 10.8*  --  9.5 8.3 8.2  NEUTROABS 7.8* 7.4  --  6.7 5.8 5.6  HGB 7.6* 6.8* 7.7* 7.5* 7.3* 7.2*  HCT 20.8* 18.9* 22.2* 20.8* 20.4* 20.9*  MCV 100.0 101.1*  --  98.6 101.0* 103.5*  PLT 57* 53*  --  50* 48* 48*   Basic Metabolic Panel: Recent Labs  Lab 01/11/21 0534 01/11/21 0804 01/11/21 1321 01/12/21 0539 01/13/21 0445 01/14/21 0446 01/15/21 0615  NA 119*   < > 118* 118* 118* 122* 122*  K 5.2*  --   --  5.0 4.9 4.4 4.7  CL 91*  --   --  91* 91* 94* 93*  CO2 21*  --   --  20* 21* 20* 22  GLUCOSE 125*  --   --  124* 118* 136* 128*  BUN 28*  --   --  34* 35* 33* 35*  CREATININE 0.33*  --   --  0.59 0.45 0.49 0.51  CALCIUM 9.6  --   --  9.1 9.0 9.1 9.0  MG 2.7*  --   --  2.6* 2.6* 2.5* 2.8*  PHOS UNABLE TO REPORT DUE TO ICTERUS  --   --  UNABLE TO REPORT DUE TO ICTERUS UNABLE TO REPORT DUE TO ICTERUS UNABLE TO REPORT DUE TO ICTERIC INTERFERENCE SDR UNABLE TO REPORT DUE TO ICTERUS   < > = values in this interval not displayed.   GFR: Estimated Creatinine Clearance: 79.8 mL/min (by C-G formula based on SCr of 0.51 mg/dL). Liver Function Tests: Recent Labs  Lab 01/11/21 0534 01/12/21 0539 01/13/21 0445 01/14/21 0446 01/15/21 0615  AST 63* 58* 53* 55* 54*  ALT 28 29 28 29 28   ALKPHOS 103 87 86 93 86  BILITOT 15.6* 12.7* 11.7* 15.9* 15.0*  PROT 5.5* 5.3* 5.0* 5.0* 4.8*  ALBUMIN 3.9 3.8 3.5 3.4* 3.2*   No results for input(s): LIPASE, AMYLASE in the last 168  hours. Recent Labs  Lab 01/09/21 0516 01/10/21 0625 01/11/21 0534 01/12/21 0539 01/13/21 0445  AMMONIA 70* 46* 63* 71* 83*   Coagulation Profile: Recent Labs  Lab 01/11/21 0804  INR 2.8*   Cardiac Enzymes: No results for input(s): CKTOTAL, CKMB, CKMBINDEX, TROPONINI in the last 168 hours. BNP (last 3 results) No results for input(s): PROBNP in the last 8760 hours. HbA1C: No results for input(s): HGBA1C in the last 72 hours. CBG: Recent Labs  Lab 01/09/21 1726  GLUCAP 140*   Lipid Profile: No results for input(s): CHOL, HDL, LDLCALC, TRIG, CHOLHDL, LDLDIRECT in the last 72 hours. Thyroid Function Tests: No results for input(s): TSH, T4TOTAL, FREET4, T3FREE, THYROIDAB in the last 72 hours. Anemia Panel: No results for input(s): VITAMINB12, FOLATE, FERRITIN, TIBC, IRON, RETICCTPCT in the last 72 hours. Sepsis Labs: No results for input(s): PROCALCITON, LATICACIDVEN in the last 168 hours.  Recent Results (from the past 240 hour(s))  Body fluid culture w Gram Stain     Status: None   Collection Time: 01/09/21 11:00 AM   Specimen: PATH Cytology Peritoneal fluid  Result Value Ref Range Status   Specimen Description   Final    PERITONEAL Performed at [facility], [address], [place], [place]    Special Requests   Final    NONE Performed at [facility], [address] [place], [place]    Gram Stain NO WBC SEEN NO ORGANISMS SEEN   Final   Culture   Final    NO GROWTH 3 DAYS Performed at [facility], [address] [place], [place]    Report Status 01/12/2021 FINAL  Final  MRSA PCR Screening     Status: None   Collection Time: 01/09/21  5:40 PM   Specimen: Nasopharyngeal  Result Value Ref Range Status   MRSA by PCR NEGATIVE NEGATIVE Final  Comment:        The GeneXpert MRSA Assay (FDA approved for NASAL specimens only), is one component of a comprehensive MRSA colonization surveillance program.  It is not intended to diagnose MRSA infection nor to guide or monitor treatment for MRSA infections. Performed at [facility], [address] [place], [place] [address]   CULTURE, BLOOD (ROUTINE X 2) w Reflex to ID Panel     Status: None   Collection Time: 01/10/21  3:56 PM   Specimen: BLOOD  Result Value Ref Range Status   Specimen Description BLOOD LEFT ARM  Final   Special Requests   Final    BOTTLES DRAWN AEROBIC AND ANAEROBIC Blood Culture results may not be optimal due to an excessive volume of blood received in culture bottles   Culture   Final    NO GROWTH 5 DAYS Performed at [facility], [address] [place], [place] [address]    Report Status 01/15/2021 FINAL  Final  CULTURE, BLOOD (ROUTINE X 2) w Reflex to ID Panel     Status: None   Collection Time: 01/10/21  3:58 PM   Specimen: BLOOD  Result Value Ref Range Status   Specimen Description BLOOD LEFT HAND  Final   Special Requests   Final    BOTTLES DRAWN AEROBIC AND ANAEROBIC Blood Culture results may not be optimal due to an excessive volume of blood received in culture bottles   Culture   Final    NO GROWTH 5 DAYS Performed at [facility], [address], [place], [place] [address]    Report Status 01/15/2021 FINAL  Final     Radiology Studies: US Paracentesis  Result Date: 01/15/2021 INDICATION: 40 year old female with a history of recurrent ascites EXAM: ULTRASOUND GUIDED  PARACENTESIS MEDICATIONS: None. COMPLICATIONS: None PROCEDURE: Informed written consent was obtained from the patient after a discussion of the risks, benefits and alternatives to treatment. A timeout was performed prior to the initiation of the procedure. Initial ultrasound scanning demonstrates a large amount of ascites within the right and left lower abdominal quadrant. The left lower abdomen was prepped and draped in the usual sterile fashion. 1% lidocaine was used for local anesthesia. Following this, a 8  Fr Safe-T-Centesis catheter was introduced. An ultrasound image was saved for documentation purposes. The paracentesis was performed. The catheter was removed and a dressing was applied. The patient tolerated the procedure well without immediate post procedural complication. Patient received post-procedure intravenous albumin; see nursing notes for details. FINDINGS: A total of approximately 5.6 L of thin fluid was removed. IMPRESSION: Status post ultrasound-guided paracentesis. Signed, Yvone Neu. Reyne Dumas, RPVI [facility] Radiology Electronically Signed   By: Gilmer Mor D.O.   On: 01/15/2021 07:41   Scheduled Meds: . bisacodyl  10 mg Rectal Once  . feeding supplement  237 mL Oral BID BM  . ferrous sulfate  325 mg Oral Q breakfast  . folic acid  1 mg Oral Daily  . lactulose  30 g Oral TID  . midodrine  10 mg Oral TID WC  . multivitamin with minerals  1 tablet Oral Daily  . pantoprazole  40 mg Oral Daily  . potassium chloride  10 mEq Oral Daily  . rifaximin  550 mg Oral BID  . sodium chloride flush  10 mL Intravenous Q12H  . spironolactone  50 mg Oral Daily  . thiamine  100 mg Oral Daily   Continuous Infusions: . sodium chloride  Stopped (01/15/21 0346)  . sodium chloride    . albumin human    . chlorproMAZINE (THORAZINE) IV       LOS: 13 days    Time spent: 25 mins.    Cipriano Bunker, MD [facility]   If 7PM-7AM, please contact night-coverage

## 2021-01-16 ENCOUNTER — Ambulatory Visit: Admission: RE | Admit: 2021-01-16 | Payer: No Typology Code available for payment source | Source: Ambulatory Visit

## 2021-01-16 LAB — CBC WITH DIFFERENTIAL/PLATELET
Abs Immature Granulocytes: 0.29 10*3/uL — ABNORMAL HIGH (ref 0.00–0.07)
Basophils Absolute: 0 10*3/uL (ref 0.0–0.1)
Basophils Relative: 1 %
Eosinophils Absolute: 0.2 10*3/uL (ref 0.0–0.5)
Eosinophils Relative: 3 %
HCT: 18.7 % — ABNORMAL LOW (ref 36.0–46.0)
Hemoglobin: 6.6 g/dL — ABNORMAL LOW (ref 12.0–15.0)
Immature Granulocytes: 4 %
Lymphocytes Relative: 8 %
Lymphs Abs: 0.5 10*3/uL — ABNORMAL LOW (ref 0.7–4.0)
MCH: 36.1 pg — ABNORMAL HIGH (ref 26.0–34.0)
MCHC: 35.3 g/dL (ref 30.0–36.0)
MCV: 102.2 fL — ABNORMAL HIGH (ref 80.0–100.0)
Monocytes Absolute: 0.9 10*3/uL (ref 0.1–1.0)
Monocytes Relative: 13 %
Neutro Abs: 4.8 10*3/uL (ref 1.7–7.7)
Neutrophils Relative %: 71 %
Platelets: 43 10*3/uL — ABNORMAL LOW (ref 150–400)
RBC: 1.83 MIL/uL — ABNORMAL LOW (ref 3.87–5.11)
RDW: 23 % — ABNORMAL HIGH (ref 11.5–15.5)
Smear Review: NORMAL
WBC: 6.7 10*3/uL (ref 4.0–10.5)
nRBC: 0 % (ref 0.0–0.2)

## 2021-01-16 LAB — MAGNESIUM: Magnesium: 3 mg/dL — ABNORMAL HIGH (ref 1.7–2.4)

## 2021-01-16 LAB — COMPREHENSIVE METABOLIC PANEL
ALT: 25 U/L (ref 0–44)
AST: 49 U/L — ABNORMAL HIGH (ref 15–41)
Albumin: 3.8 g/dL (ref 3.5–5.0)
Alkaline Phosphatase: 80 U/L (ref 38–126)
Anion gap: 9 (ref 5–15)
BUN: 41 mg/dL — ABNORMAL HIGH (ref 6–20)
CO2: 19 mmol/L — ABNORMAL LOW (ref 22–32)
Calcium: 9.1 mg/dL (ref 8.9–10.3)
Chloride: 93 mmol/L — ABNORMAL LOW (ref 98–111)
Creatinine, Ser: 0.67 mg/dL (ref 0.44–1.00)
GFR, Estimated: >60 mL/min (ref >60)
Glucose, Bld: 124 mg/dL — ABNORMAL HIGH (ref 70–99)
Potassium: 5 mmol/L (ref 3.5–5.1)
Sodium: 121 mmol/L — ABNORMAL LOW (ref 135–145)
Total Bilirubin: 13.4 mg/dL — ABNORMAL HIGH (ref 0.3–1.2)
Total Protein: 5.3 g/dL — ABNORMAL LOW (ref 6.5–8.1)

## 2021-01-16 LAB — HEMOGLOBIN AND HEMATOCRIT, BLOOD
HCT: 23.1 % — ABNORMAL LOW (ref 36.0–46.0)
Hemoglobin: 8.1 g/dL — ABNORMAL LOW (ref 12.0–15.0)

## 2021-01-16 LAB — RESP PANEL BY RT-PCR (FLU A&B, COVID) ARPGX2
Influenza A by PCR: NEGATIVE
Influenza B by PCR: NEGATIVE
SARS Coronavirus 2 by RT PCR: NEGATIVE

## 2021-01-16 LAB — PHOSPHORUS: Phosphorus: UNDETERMINED mg/dL (ref 2.5–4.6)

## 2021-01-16 LAB — PREPARE RBC (CROSSMATCH)

## 2021-01-16 MED ORDER — SODIUM CHLORIDE 0.9% IV SOLUTION: Freq: Once | INTRAVENOUS | Status: DC

## 2021-01-16 NOTE — Plan of Care (Addendum)
  Problem: Education: Goal: Knowledge of General Education information will improve Description: Including pain rating scale, medication(s)/side effects and non-pharmacologic comfort measures Outcome: Progressing   Problem: Health Behavior/Discharge Planning: Goal: Ability to manage health-related needs will improve Outcome: Progressing   Problem: Clinical Measurements: Goal: Ability to maintain clinical measurements within normal limits will improve Outcome: Progressing Goal: Will remain free from infection Outcome: Progressing Goal: Diagnostic test results will improve Outcome: Progressing Goal: Respiratory complications will improve Outcome: Progressing Goal: Cardiovascular complication will be avoided Outcome: Progressing   Problem: Activity: Goal: Risk for activity intolerance will decrease Outcome: Progressing   Problem: Nutrition: Goal: Adequate nutrition will be maintained Outcome: Progressing   Problem: Coping: Goal: Level of anxiety will decrease Outcome: Progressing   Problem: Elimination: Goal: Will not experience complications related to bowel motility Outcome: Progressing Goal: Will not experience complications related to urinary retention Outcome: Progressing   Problem: Pain Managment: Goal: General experience of comfort will improve Outcome: Progressing   Problem: Safety: Goal: Ability to remain free from injury will improve Outcome: Progressing   Problem: Skin Integrity: Goal: Risk for impaired skin integrity will decrease Outcome: Progressing     Pt is AAOx4. Pt denies any pain. Denies n/v. Mews was yellow this shift due to low SBP. q4h vitals initiated. APP notified. Albumin 25 g given. SBP now at low 90s. Will continue to monitor.

## 2021-01-16 NOTE — Progress Notes (Deleted)
Patient states 7/10 chronic abdomen pain.  Discussed management with patient and due to low BP's, patient does not want to take any pain meds at this time.  Repositioned for comfort.

## 2021-01-16 NOTE — Discharge Instructions (Signed)
Patient is being transferred to [facility] for liver transplant.

## 2021-01-16 NOTE — Progress Notes (Signed)
Patient transferred to [facility] for liver transplant.  Report called and given to Alycia Rossetti RN at [facility].  Patient with no complaints prior to transfer. Tele removed and IV left in for the transfer.

## 2021-01-16 NOTE — Discharge Summary (Signed)
Physician Discharge Summary  Lindsey SeveranceKelly Huang WUJ:[id number] DOB: Jul 02, 1981 DOA: 01/02/2021  PCP: Joya MartyrSegovia, Maria Cristina, MD  Admit date: 01/02/2021 .  Discharge date: 01/16/2021  Admitted From:  Home.  Disposition:   [facility]  Recommendations for Outpatient Follow-up:  1. Follow up with PCP in 1-2 weeks. 2. Please obtain BMP/CBC in one week. 3. [facility]  Home Health: None. Equipment/Devices: None.  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 She has periodic outpatient paracentesis. On day before admission, She had an outpatient paracentesis, showed 517nucleated cells, sent to ER. 2/12:Admitted, and started on Rocephin 2/13 - 2/18:Patient had persistent abdominal distension, unclear if constipation or ascites. -completed 5 days ceftriaxone and constipation resolved. -but still symptomatic, increasingly encephalopathic 2/18: Repeat US paracentesis with 4L fluid. -BP trending down to 80s/40s ->started onlevophed. -discussed with [facility] and hospitalist medicine, accepted for transfer for acute decompensated liver failure. 2/19: Transfused blood, additional albumin and levophed weaned off. 2/20: Remained off Levophed. 2/21 ; Hemoglobin 6.8 received 1 unit PRBC. 2/22-23 : Hemoglobin remained stable,  She feels better,  [facility] 2/23: Underwent repeat paracentesis by IR ,  5.6 L of clear fluid drained. 2/24: BP remained low,  IV albumin infusion given. 2/25: Hb dropped to 6.6, Receiving 1 PRBC , [facility]    Discharge Diagnoses:  Active Problems:   SBP (spontaneous bacterial peritonitis)  (HCC)  Decompensated cirrhosis: Alcoholic cirrhosis with ascites/portal hypertension, encephalopathy, thrombocytopenia Hepatic encephalopathy Hyponatremia, hypochloremia, and coagulopathy due to cirrhosis -Patient initially admitted and started on Rocephin for SBP on 2/12. -She had progressive weakness, malaise, abdominal distension/pain, and then confusion. -Due to progressive abdominal distension, had repeat paracentesis on 2/18, nucleated cell count improved to 82 from >500 prior to admission. -After second paracentesis, patient got albumin25g, but BP drifted down to 80s/40s and so Levophed was started peripherally and [facility] -Blood cultures drawn and antibiotics broadened to Zosyn. No new fever. Culture on repeat paracentesis and blood cultures no growth to date.   MELD 33.  -She remains encephalopathic, weak, jaundiced consistent with acute on chronic liver failure.  Discussed with Dr Allena KatzPatel from [facility] on 2/19 and Dr. Louie Bunhudgar from Internal medicine 2/20..   Na slightly improved . Remains grossly overloaded on exam. -Continue Albumin 25 g TID for one day -Continue furosemide and Spironolactone -ContinueLactulose and rifaximin -Daily BMP -Underwent repeat paracentesis by IR 5.6 L of clear fluid drained. -Patient has received albumin infusion   Normocytic anemia Transfused 2/15and 2/19 and 2/21 -Continue iron -Continue PPI Hb 7.5 on 2/22.   Spontaneous bacterial peritonitis Paracentesis 2/11 with negative culture but 517 nuc cells per mm3. Treated with 5 days Rocephin. Paracentesis 2/18 with only 82 nuc cells/mm3, gram stain negative. Deteriorating in last 72 hours, Rocephin broadened to Zosyn empirically.  Cultures no growth, this could probably be stopped tomorrow if no growth at 48 hours.  Completed zosyn for three days.  Hypotension : BP remains on lower side. Continue midodrine TID.  Discharge  Instructions  Discharge Instructions    Diet - low sodium heart healthy   Complete by: As directed    Diet Carb Modified   Complete by: As directed    Increase activity slowly   Complete  by: As directed      Allergies as of 01/16/2021   No Known Allergies     Medication List    TAKE these medications   albumin human 25 % bottle Inject 100 mLs (25 g total) into the vein every 6 (six) hours.   feeding supplement Liqd Take 237 mLs by mouth 2 (two) times daily between meals.   ferrous sulfate 325 (65 FE) MG tablet Take 1 tablet (325 mg total) by mouth daily with breakfast.   folic acid 1 MG tablet Commonly known as: FOLVITE Take 1 tablet (1 mg total) by mouth daily.   furosemide 20 MG tablet Commonly known as: LASIX Take 3 tablets (60 mg total) by mouth daily.   lactulose 10 GM/15ML solution Commonly known as: CHRONULAC Take 15 mLs (10 g total) by mouth 2 (two) times daily.   midodrine 10 MG tablet Commonly known as: PROAMATINE Take 1 tablet (10 mg total) by mouth 3 (three) times daily with meals.   multivitamin with minerals Tabs tablet Take 1 tablet by mouth daily.   norepinephrine 4-5 MG/250ML-% Soln Commonly known as: LEVOPHED Inject 2-10 mcg/min into the vein continuous.   ondansetron 4 MG tablet Commonly known as: ZOFRAN Take 1 tablet (4 mg total) by mouth every 6 (six) hours as needed for nausea.   ondansetron 4 MG/2ML Soln injection Commonly known as: ZOFRAN Inject 2 mLs (4 mg total) into the vein every 6 (six) hours as needed for nausea.   Oxycodone HCl 10 MG Tabs Take 1 tablet (10 mg total) by mouth every 4 (four) hours as needed for moderate pain.   pantoprazole 40 MG tablet Commonly known as: PROTONIX Take 1 tablet (40 mg total) by mouth daily.   piperacillin-tazobactam 3.375 GM/50ML IVPB Commonly known as: ZOSYN Inject 50 mLs (3.375 g total) into the vein every 8 (eight) hours.   potassium chloride 10 MEQ tablet Commonly known as:  KLOR-CON Take 1 tablet (10 mEq total) by mouth daily.   rifaximin 550 MG Tabs tablet Commonly known as: XIFAXAN Take 1 tablet (550 mg total) by mouth 2 (two) times daily.   spironolactone 50 MG tablet Commonly known as: ALDACTONE Take 1 tablet (50 mg total) by mouth daily.   thiamine 100 MG tablet Take 100 mg by mouth daily.   traZODone 100 MG tablet Commonly known as: DESYREL Take 1 tablet (100 mg total) by mouth at bedtime as needed for sleep.   vitamin B-12 1000 MCG tablet Commonly known as: CYANOCOBALAMIN Take 1,000 mcg by mouth daily.       No Known Allergies  Consultations:  None   Procedures/Studies: DG Chest 2 View  Result Date: 01/02/2021 CLINICAL DATA:  Suspected sepsis, paracentesis morning with reported infection EXAM: CHEST - 2 VIEW COMPARISON:  Radiograph 11/23/2020 FINDINGS: No consolidation, features of edema, pneumothorax, or effusion. Pulmonary vascularity is normally distributed. The cardiomediastinal contours are unremarkable. No acute osseous or soft tissue abnormality. IMPRESSION: No acute cardiopulmonary abnormality. Electronically Signed   By: Kreg Shropshire M.D.   On: 01/02/2021 19:01   DG Abd 1 View  Result Date: 01/07/2021 CLINICAL DATA:  Constipation, EGD 11/24/2020 EXAM: ABDOMEN - 1 VIEW COMPARISON:  01/04/2021 FINDINGS: Gaseous distention of stomach. Air-filled loops of small bowel and colon without distension. Decreased stool burden. Remains stool overlying the distal transverse colon and splenic flexure. IMPRESSION: Gaseous distention of the stomach. Decreased stool burden since the prior study. Focal large volume of stool near the splenic flexure. Electronically Signed  By: Guadlupe Spanish M.D.   On: 01/07/2021 14:20   DG Abd 1 View  Result Date: 01/04/2021 CLINICAL DATA:  Constipation EXAM: ABDOMEN - 1 VIEW COMPARISON:  None. FINDINGS: The bowel gas pattern is nonobstructive. There is a very large amount of stool throughout the colon. There  is no visualized acute osseous abnormality. No radiopaque kidney stones. IMPRESSION: Very large amount of stool throughout the colon. No evidence of bowel obstruction. Electronically Signed   By: Katherine Mantle M.D.   On: 01/04/2021 19:44   US Abdomen Limited  Result Date: 01/04/2021 CLINICAL DATA:  Evaluate ascites. EXAM: LIMITED ABDOMEN ULTRASOUND FOR ASCITES TECHNIQUE: Limited ultrasound survey for ascites was performed in all four abdominal quadrants. COMPARISON:  December 11, 2020. FINDINGS: Small ascites, mostly in the left lower quadrant. IMPRESSION: 1. Small ascites, not enough for paracentesis. Electronically Signed   By: Obie Dredge M.D.   On: 01/04/2021 11:39   US Paracentesis  Result Date: 01/15/2021 INDICATION: 40 year old female with a history of recurrent ascites EXAM: ULTRASOUND GUIDED  PARACENTESIS MEDICATIONS: None. COMPLICATIONS: None PROCEDURE: Informed written consent was obtained from the patient after a discussion of the risks, benefits and alternatives to treatment. A timeout was performed prior to the initiation of the procedure. Initial ultrasound scanning demonstrates a large amount of ascites within the right and left lower abdominal quadrant. The left lower abdomen was prepped and draped in the usual sterile fashion. 1% lidocaine was used for local anesthesia. Following this, a 8 Fr Safe-T-Centesis catheter was introduced. An ultrasound image was saved for documentation purposes. The paracentesis was performed. The catheter was removed and a dressing was applied. The patient tolerated the procedure well without immediate post procedural complication. Patient received post-procedure intravenous albumin; see nursing notes for details. FINDINGS: A total of approximately 5.6 L of thin fluid was removed. IMPRESSION: Status post ultrasound-guided paracentesis. Signed, Yvone Neu. Reyne Dumas, RPVI [facility] Electronically  Signed   By: Gilmer Mor D.O.   On: 01/15/2021 07:41   US Paracentesis  Result Date: 01/09/2021 INDICATION: Patient with a history of alcoholic cirrhosis and recurrent large volume ascites. Request is for a therapeutic and diagnostic paracentesis up to 4 L. EXAM: ULTRASOUND GUIDED PARACENTESIS MEDICATIONS: 1% lidocaine 10 mL COMPLICATIONS: None immediate. PROCEDURE: Informed written consent was obtained from the patient after a discussion of the risks, benefits and alternatives to treatment. A timeout was performed prior to the initiation of the procedure. Initial ultrasound scanning demonstrates a large amount of ascites within the right lower abdominal quadrant. The right lower abdomen was prepped and draped in the usual sterile fashion. 1% lidocaine was used for local anesthesia. Following this, a 19 gauge, 7-cm, Yueh catheter was introduced. An ultrasound image was saved for documentation purposes. The paracentesis was performed. The catheter was removed and a dressing was applied. The patient tolerated the procedure well without immediate post procedural complication. FINDINGS: A total of approximately 4 L of hazy yellow fluid was removed. Samples were sent to the laboratory as requested by the clinical team. IMPRESSION: Successful ultrasound-guided paracentesis yielding 4 liters of peritoneal fluid. Read by: Alwyn Ren, NP Electronically Signed   By: Judie Petit.  Shick M.D.   On: 01/09/2021 12:03   US Paracentesis  Result Date: 01/02/2021 INDICATION: Patient with history of alcoholic cirrhosis with recurrent ascites. Request is for therapeutic and diagnostic paracentesis with a maximum of 5 L EXAM: ULTRASOUND GUIDED THERAPEUTIC AND DIAGNOSTIC PARACENTESIS MEDICATIONS: Lidocaine 1% 10 mL COMPLICATIONS:  None immediate. PROCEDURE: Informed written consent was obtained from the patient after a discussion of the risks, benefits and alternatives to treatment. A timeout was performed prior to the initiation of  the procedure. Initial ultrasound scanning demonstrates a large amount of ascites within the right lower abdominal quadrant. The right lower abdomen was prepped and draped in the usual sterile fashion. 1% lidocaine was used for local anesthesia. Following this, a 19 gauge, 7-cm, Yueh catheter was introduced. An ultrasound image was saved for documentation purposes. The paracentesis was performed. The catheter was removed and a dressing was applied. The patient tolerated the procedure well without immediate post procedural complication. Patient received post-procedure intravenous albumin; see nursing notes for details. FINDINGS: A total of approximately 5 L of straw-colored fluid was removed. Samples were sent to the laboratory as requested by the clinical team. IMPRESSION: Successful ultrasound-guided therapeutic and diagnostic paracentesis yielding 5 liters of peritoneal fluid. Read by: Anders Grant, NP Electronically Signed   By: Simonne Come M.D.   On: 01/02/2021 10:09   US Paracentesis  Result Date: 12/25/2020 INDICATION: Alcoholic cirrhosis EXAM: ULTRASOUND GUIDED THERAPEUTIC PARACENTESIS MEDICATIONS: None. COMPLICATIONS: None immediate. PROCEDURE: Informed written consent was obtained from the patient after a discussion of the risks, benefits and alternatives to treatment. A timeout was performed prior to the initiation of the procedure. Initial ultrasound scanning demonstrates a large amount of ascites within the right lower abdominal quadrant. The right lower abdomen was prepped and draped in the usual sterile fashion. 1% lidocaine was used for local anesthesia. Following this, a 8 Fr Safe-T-Centesis catheter was introduced. An ultrasound image was saved for documentation purposes. The paracentesis was performed. The catheter was removed and a dressing was applied. The patient tolerated the procedure well without immediate post procedural complication. Patient received post-procedure intravenous  albumin; see nursing notes for details. FINDINGS: A total of approximately 4.1 L of straw-colored fluid was removed. IMPRESSION: Successful ultrasound-guided paracentesis yielding 4.1 liters of peritoneal fluid. Electronically Signed   By: Acquanetta Belling M.D.   On: 12/25/2020 10:07   US Paracentesis  Result Date: 12/18/2020 INDICATION: Alcoholic cirrhosis of the liver with ascites. EXAM: ULTRASOUND GUIDED PARACENTESIS MEDICATIONS: None. COMPLICATIONS: None immediate. PROCEDURE: Informed written consent was obtained from the patient after a discussion of the risks, benefits and alternatives to treatment. A timeout was performed prior to the initiation of the procedure. Initial ultrasound scanning demonstrates a large amount of ascites within the right lower abdominal quadrant. The right lower abdomen was prepped and draped in the usual sterile fashion. 1% lidocaine was used for local anesthesia. Following this, a 6 Fr Safe-T-Centesis catheter was introduced. An ultrasound image was saved for documentation purposes. The paracentesis was performed. The catheter was removed and a dressing was applied. The patient tolerated the procedure well without immediate post procedural complication. FINDINGS: A total of approximately 3.75 L of yellow fluid was removed. IMPRESSION: Successful ultrasound-guided paracentesis yielding 3.75 liters of peritoneal fluid. Electronically Signed   By: Richarda Overlie M.D.   On: 12/18/2020 14:21   ECHOCARDIOGRAM COMPLETE  Result Date: 01/11/2021    ECHOCARDIOGRAM REPORT   Patient Name:   Lindsey Huang Date of Exam: 01/11/2021 Medical Rec #:  [id number]    Height:       59.0 in Accession #:    [id number]   Weight:       147.0 lb Date of Birth:  02/16/1981   BSA:          1.618 m Patient Age:  39 years     BP:           92/79 mmHg Patient Gender: F            HR:           86 bpm. Exam Location:  [facility] Procedure: 2D Echo, Cardiac Doppler and Color Doppler Indications:     Murmur 785.2 / R01.1   History:         Patient has no prior history of Echocardiogram examinations.  Sonographer:     Neysa Bonito Roar Referring Phys:  [id number] CHRISTOPHER P DANFORD Diagnosing Phys: Adrian Blackwater MD IMPRESSIONS  1. Left ventricular ejection fraction, by estimation, is 60 to 65%. The left ventricle has normal function. The left ventricle has no regional wall motion abnormalities. There is mild concentric left ventricular hypertrophy. Left ventricular diastolic parameters were normal.  2. Right ventricular systolic function is mildly reduced. The right ventricular size is moderately enlarged.  3. Left atrial size was mild to moderately dilated.  4. Right atrial size was mildly dilated.  5. A small pericardial effusion is present. The pericardial effusion is posterior to the left ventricle.  6. The mitral valve is normal in structure. Mild to moderate mitral valve regurgitation. No evidence of mitral stenosis.  7. Tricuspid valve regurgitation is mild to moderate.  8. The aortic valve is normal in structure. Aortic valve regurgitation is not visualized. No aortic stenosis is present.  9. The inferior vena cava is normal in size with greater than 50% respiratory variability, suggesting right atrial pressure of 3 mmHg. FINDINGS  Left Ventricle: Left ventricular ejection fraction, by estimation, is 60 to 65%. The left ventricle has normal function. The left ventricle has no regional wall motion abnormalities. The left ventricular internal cavity size was normal in size. There is  mild concentric left ventricular hypertrophy. Left ventricular diastolic parameters were normal. Right Ventricle: The right ventricular size is moderately enlarged. No increase in right ventricular wall thickness. Right ventricular systolic function is mildly reduced. Left Atrium: Left atrial size was mild to moderately dilated. Right Atrium: Right atrial size was mildly dilated. Pericardium: A small pericardial effusion is present. The pericardial  effusion is posterior to the left ventricle. Mitral Valve: The mitral valve is normal in structure. Mild to moderate mitral valve regurgitation. No evidence of mitral valve stenosis. Tricuspid Valve: The tricuspid valve is normal in structure. Tricuspid valve regurgitation is mild to moderate. No evidence of tricuspid stenosis. Aortic Valve: The aortic valve is normal in structure. Aortic valve regurgitation is not visualized. No aortic stenosis is present. Aortic valve mean gradient measures 10.5 mmHg. Aortic valve peak gradient measures 23.0 mmHg. Aortic valve area, by VTI measures 2.92 cm. Pulmonic Valve: The pulmonic valve was normal in structure. Pulmonic valve regurgitation is trivial. No evidence of pulmonic stenosis. Aorta: The aortic root is normal in size and structure. Venous: The inferior vena cava is normal in size with greater than 50% respiratory variability, suggesting right atrial pressure of 3 mmHg. IAS/Shunts: No atrial level shunt detected by color flow Doppler.  LEFT VENTRICLE PLAX 2D LVIDd:         4.38 cm  Diastology LVIDs:         2.87 cm  LV e' medial:    9.46 cm/s LV PW:         1.15 cm  LV E/e' medial:  11.3 LV IVS:        1.17 cm  LV e' lateral:   11.50  cm/s LVOT diam:     2.00 cm  LV E/e' lateral: 9.3 LV SV:         123 LV SV Index:   76 LVOT Area:     3.14 cm  RIGHT VENTRICLE RV Mid diam:    3.30 cm RV S prime:     16.20 cm/s TAPSE (M-mode): 2.6 cm LEFT ATRIUM             Index       RIGHT ATRIUM           Index LA diam:        4.30 cm 2.66 cm/m  RA Area:     15.50 cm LA Vol (A2C):   88.7 ml 54.81 ml/m RA Volume:   37.10 ml  22.92 ml/m LA Vol (A4C):   76.6 ml 47.33 ml/m LA Biplane Vol: 83.5 ml 51.60 ml/m  AORTIC VALVE                    PULMONIC VALVE AV Area (Vmax):    2.92 cm     PV Vmax:        1.53 m/s AV Area (Vmean):   2.80 cm     PV Peak grad:   9.4 mmHg AV Area (VTI):     2.92 cm     RVOT Peak grad: 6 mmHg AV Vmax:           240.00 cm/s AV Vmean:          144.500 cm/s  AV VTI:            0.419 m AV Peak Grad:      23.0 mmHg AV Mean Grad:      10.5 mmHg LVOT Vmax:         223.00 cm/s LVOT Vmean:        129.000 cm/s LVOT VTI:          0.390 m LVOT/AV VTI ratio: 0.93  AORTA Ao Root diam: 2.80 cm MITRAL VALVE                TRICUSPID VALVE MV Area (PHT): 2.91 cm     TR Peak grad:   48.7 mmHg MV Decel Time: 261 msec     TR Vmax:        349.00 cm/s MV E velocity: 107.00 cm/s MV A velocity: 101.00 cm/s  SHUNTS MV E/A ratio:  1.06         Systemic VTI:  0.39 m MV A Prime:    13.5 cm/s    Systemic Diam: 2.00 cm Adrian Blackwater MD Electronically signed by Adrian Blackwater MD Signature Date/Time: 01/11/2021/7:20:13 PM    Final    [place] ASCITES (ABDOMEN LIMITED)  Result Date: 01/06/2021 CLINICAL DATA:  Ascites EXAM: LIMITED ABDOMEN ULTRASOUND FOR ASCITES TECHNIQUE: Limited ultrasound survey for ascites was performed in all four abdominal quadrants. COMPARISON:  Ultrasound dated 01/04/2021 FINDINGS: There is a small volume of abdominal ascites. The liver appears cirrhotic. IMPRESSION: Small volume abdominal ascites. Electronically Signed   By: Katherine Mantle M.D.   On: 01/06/2021 16:09    Ultrasound-guided paracentesis twice   Subjective: Patient reports feeling better, Overnight events noted.  Patient seems frustrated with wait,   She is finally transferred to [facility] for liver transplant and further care.  Discharge Exam: Vitals:   01/16/21 1113 01/16/21 1221  BP: (!) 98/56 (!) 92/44  Pulse: 83 90  Resp: 18 16  Temp: 98.7 F (37.1 C)  98.4 F (36.9 C)  SpO2: 96% 97%   Vitals:   01/16/21 1042 01/16/21 1051 01/16/21 1113 01/16/21 1221  BP: (!) 93/51 (!) 94/52 (!) 98/56 (!) 92/44  Pulse: 82 83 83 90  Resp: Temp: 98 F (36.7 C)  98.7 F (37.1 C) 98.4 F (36.9 C)  TempSrc: Oral  Oral Oral  SpO2: 97%  96% 97%  Weight:      Height:        General: Pt is alert, awake, not in acute distress Cardiovascular: RRR, S1/S2 +, no rubs, no gallops Respiratory: CTA  bilaterally, no wheezing, no rhonchi Abdominal: Soft, NT, Distended , bowel sounds + Extremities: no edema, no cyanosis    The results of significant diagnostics from this hospitalization (including imaging, microbiology, ancillary and laboratory) are listed below for reference.     Microbiology: Recent Results (from the past 240 hour(s))  Body fluid culture w Gram Stain     Status: None   Collection Time: 01/09/21 11:00 AM   Specimen: PATH Cytology Peritoneal fluid  Result Value Ref Range Status   Specimen Description   Final    PERITONEAL Performed at [facility], [address], [place], [place] [address]    Special Requests   Final    NONE Performed at [facility], [address] [place], [place] [address]    Gram Stain NO WBC SEEN NO ORGANISMS SEEN   Final   Culture   Final    NO GROWTH 3 DAYS Performed at [facility], [address] [place], [place] [address]    Report Status 01/12/2021 FINAL  Final  MRSA PCR Screening     Status: None   Collection Time: 01/09/21  5:40 PM   Specimen: Nasopharyngeal  Result Value Ref Range Status   MRSA by PCR NEGATIVE NEGATIVE Final    Comment:        The GeneXpert MRSA Assay (FDA approved for NASAL specimens only), is one component of a comprehensive MRSA colonization surveillance program. It is not intended to diagnose MRSA infection nor to guide or monitor treatment for MRSA infections. Performed at [facility], [address] [place], [place] [address]   CULTURE, BLOOD (ROUTINE X 2) w Reflex to ID Panel     Status: None   Collection Time: 01/10/21  3:56 PM   Specimen: BLOOD  Result Value Ref Range Status   Specimen Description BLOOD LEFT ARM  Final   Special Requests   Final    BOTTLES DRAWN AEROBIC AND ANAEROBIC Blood Culture results may not be optimal due to an excessive volume of blood received in culture bottles   Culture   Final    NO GROWTH 5 DAYS Performed at [facility], [address] [place], [place] [address]    Report Status 01/15/2021 FINAL  Final  CULTURE, BLOOD (ROUTINE X 2) w Reflex to ID Panel     Status: None   Collection Time: 01/10/21  3:58 PM   Specimen: BLOOD  Result Value Ref Range Status   Specimen Description BLOOD LEFT HAND  Final   Special Requests   Final    BOTTLES DRAWN AEROBIC AND ANAEROBIC Blood Culture results may not be optimal due to an excessive volume of blood received in culture bottles   Culture   Final    NO GROWTH 5 DAYS Performed at [facility], [address], [place] [place], [place] [address]  Report Status 01/15/2021 FINAL  Final     Labs: BNP (last 3 results) No results for input(s): BNP in the last 8760 hours. Basic Metabolic Panel: Recent Labs  Lab 01/12/21 0539 01/13/21 0445 01/14/21 0446 01/15/21 0615 01/16/21 0613  NA 118* 118* 122* 122* 121*  K 5.0 4.9 4.4 4.7 5.0  CL 91* 91* 94* 93* 93*  CO2 20* 21* 20* 22 19*  GLUCOSE 124* 118* 136* 128* 124*  BUN 34* 35* 33* 35* 41*  CREATININE 0.59 0.45 0.49 0.51 0.67  CALCIUM 9.1 9.0 9.1 9.0 9.1  MG 2.6* 2.6* 2.5* 2.8* 3.0*  PHOS UNABLE TO REPORT DUE TO ICTERUS UNABLE TO REPORT DUE TO ICTERUS UNABLE TO REPORT DUE TO ICTERIC INTERFERENCE SDR UNABLE TO REPORT DUE TO ICTERUS UNABLE TO REPORT DUE TO ICTERUS   Liver Function Tests: Recent Labs  Lab 01/12/21 0539 01/13/21 0445 01/14/21 0446 01/15/21 0615 01/16/21 0613  AST 58* 53* 55* 54* 49*  ALT 29 28 29 28 25   ALKPHOS 87 86 93 86 80  BILITOT 12.7* 11.7* 15.9* 15.0* 13.4*  PROT 5.3* 5.0* 5.0* 4.8* 5.3*  ALBUMIN 3.8 3.5 3.4* 3.2* 3.8   No results for input(s): LIPASE, AMYLASE in the last 168 hours. Recent Labs  Lab 01/10/21 0625 01/11/21 0534 01/12/21 0539 01/13/21 0445  AMMONIA 46* 63* 71* 83*   CBC: Recent Labs  Lab 01/12/21 0539 01/12/21 1916 01/13/21 0445 01/14/21 0446 01/15/21 0615 01/16/21 0613  WBC 10.8*  --  9.5 8.3 8.2 6.7  NEUTROABS 7.4  --  6.7 5.8 5.6  4.8  HGB 6.8* 7.7* 7.5* 7.3* 7.2* 6.6*  HCT 18.9* 22.2* 20.8* 20.4* 20.9* 18.7*  MCV 101.1*  --  98.6 101.0* 103.5* 102.2*  PLT 53*  --  50* 48* 48* 43*   Cardiac Enzymes: No results for input(s): CKTOTAL, CKMB, CKMBINDEX, TROPONINI in the last 168 hours. BNP: Invalid input(s): POCBNP CBG: Recent Labs  Lab 01/09/21 1726  GLUCAP 140*   D-Dimer No results for input(s): DDIMER in the last 72 hours. Hgb A1c No results for input(s): HGBA1C in the last 72 hours. Lipid Profile No results for input(s): CHOL, HDL, LDLCALC, TRIG, CHOLHDL, LDLDIRECT in the last 72 hours. Thyroid function studies No results for input(s): TSH, T4TOTAL, T3FREE, THYROIDAB in the last 72 hours.  Invalid input(s): FREET3 Anemia work up No results for input(s): VITAMINB12, FOLATE, FERRITIN, TIBC, IRON, RETICCTPCT in the last 72 hours. Urinalysis    Component Value Date/Time   COLORURINE AMBER (A) 01/03/2021 0550   APPEARANCEUR CLEAR (A) 01/03/2021 0550   APPEARANCEUR Turbid 08/07/2012 0746   LABSPEC 1.009 01/03/2021 0550   LABSPEC 1.026 08/07/2012 0746   PHURINE 7.0 01/03/2021 0550   GLUCOSEU NEGATIVE 01/03/2021 0550   GLUCOSEU Negative 08/07/2012 0746   HGBUR NEGATIVE 01/03/2021 0550   BILIRUBINUR NEGATIVE 01/03/2021 0550   BILIRUBINUR Negative 08/07/2012 0746   KETONESUR NEGATIVE 01/03/2021 0550   PROTEINUR NEGATIVE 01/03/2021 0550   NITRITE NEGATIVE 01/03/2021 0550   LEUKOCYTESUR NEGATIVE 01/03/2021 0550   LEUKOCYTESUR Negative 08/07/2012 0746   Sepsis Labs Invalid input(s): PROCALCITONIN,  WBC,  LACTICIDVEN Microbiology Recent Results (from the past 240 hour(s))  Body fluid culture w Gram Stain     Status: None   Collection Time: 01/09/21 11:00 AM   Specimen: PATH Cytology Peritoneal fluid  Result Value Ref Range Status   Specimen Description   Final    PERITONEAL Performed at [facility], [address] [place], [place]  Special Requests   Final     NONE Performed at [facility], [address] [place], [place] [address]    Gram Stain NO WBC SEEN NO ORGANISMS SEEN   Final   Culture   Final    NO GROWTH 3 DAYS Performed at [facility], [address], [place], [place] [address]    Report Status 01/12/2021 FINAL  Final  MRSA PCR Screening     Status: None   Collection Time: 01/09/21  5:40 PM   Specimen: Nasopharyngeal  Result Value Ref Range Status   MRSA by PCR NEGATIVE NEGATIVE Final    Comment:        The GeneXpert MRSA Assay (FDA approved for NASAL specimens only), is one component of a comprehensive MRSA colonization surveillance program. It is not intended to diagnose MRSA infection nor to guide or monitor treatment for MRSA infections. Performed at [facility], [address] [place], [place] [address]   CULTURE, BLOOD (ROUTINE X 2) w Reflex to ID Panel     Status: None   Collection Time: 01/10/21  3:56 PM   Specimen: BLOOD  Result Value Ref Range Status   Specimen Description BLOOD LEFT ARM  Final   Special Requests   Final    BOTTLES DRAWN AEROBIC AND ANAEROBIC Blood Culture results may not be optimal due to an excessive volume of blood received in culture bottles   Culture   Final    NO GROWTH 5 DAYS Performed at [facility], [address] [place], [place] [address]    Report Status 01/15/2021 FINAL  Final  CULTURE, BLOOD (ROUTINE X 2) w Reflex to ID Panel     Status: None   Collection Time: 01/10/21  3:58 PM   Specimen: BLOOD  Result Value Ref Range Status   Specimen Description BLOOD LEFT HAND  Final   Special Requests   Final    BOTTLES DRAWN AEROBIC AND ANAEROBIC Blood Culture results may not be optimal due to an excessive volume of blood received in culture bottles   Culture   Final    NO GROWTH 5 DAYS Performed at [facility], [address] [place], [place] [address]    Report Status 01/15/2021 FINAL  Final     Time coordinating  discharge: Over 30 minutes  SIGNED:   Cipriano Bunker, MD  [facility] 01/16/2021, 12:41 PM Pager   If 7PM-7AM, please contact night-coverage [email or web address]

## 2021-01-17 LAB — LIPASE, FLUID: Lipase-Fluid: 19 U/L

## 2021-01-17 LAB — BPAM RBC
Blood Product Expiration Date: 202203262359
ISSUE DATE / TIME: 202202251048
Unit Type and Rh: 6200

## 2021-01-17 LAB — TYPE AND SCREEN
ABO/RH(D): A POS
Antibody Screen: NEGATIVE
Unit division: 0

## 2021-01-23 ENCOUNTER — Ambulatory Visit: Payer: No Typology Code available for payment source

## 2021-02-13 ENCOUNTER — Ambulatory Visit: Payer: No Typology Code available for payment source

## 2022-01-18 IMAGING — US US PARACENTESIS
1 series · 3 of 3 positions shown · non-contrast
Comparison: CT abdomen and pelvis-05/27/2020

MEDICATIONS:
None.

COMPLICATIONS:
None immediate.

INDICATION: History of alcoholic cirrhosis, now with symptomatic intra-abdominal
ascites. Please perform ultrasound-guided paracentesis for
diagnostic therapeutic purposes.

EXAM:
ULTRASOUND-GUIDED PARACENTESIS
TECHNIQUE: Informed written consent was obtained from the patient after a
discussion of the risks, benefits and alternatives to treatment. A
timeout was performed prior to the initiation of the procedure.

[Series 1: us paracentesis · 0.25mm/px · 3 of 3 slices shown]
[im 1/3]
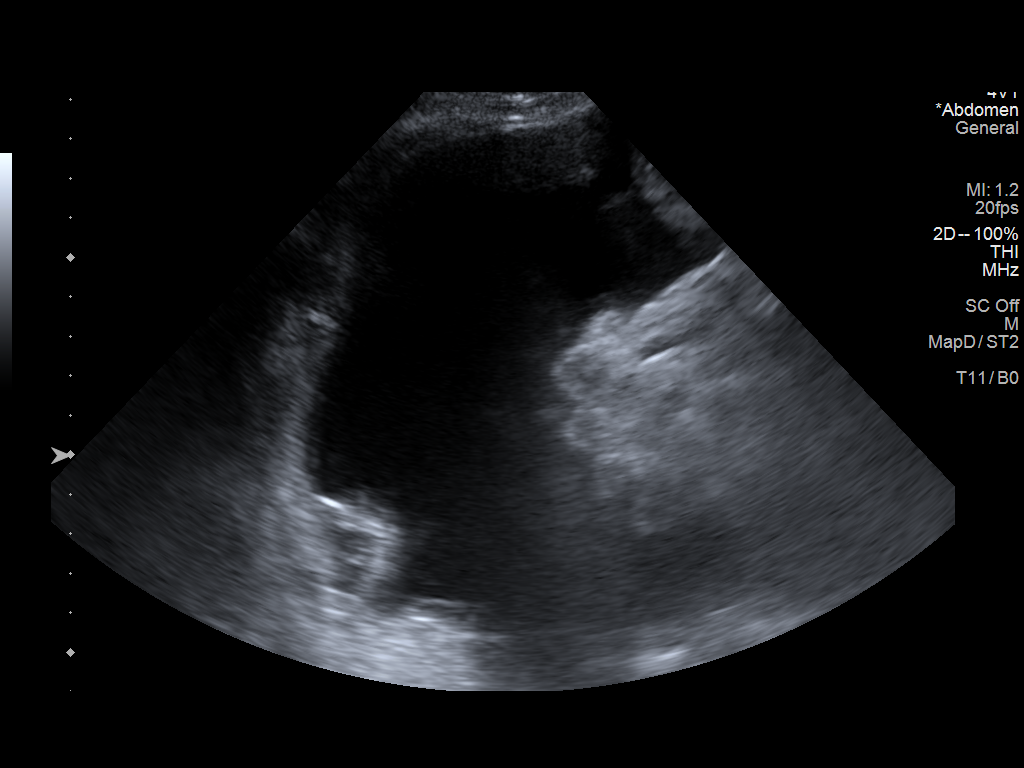
[im 2/3]
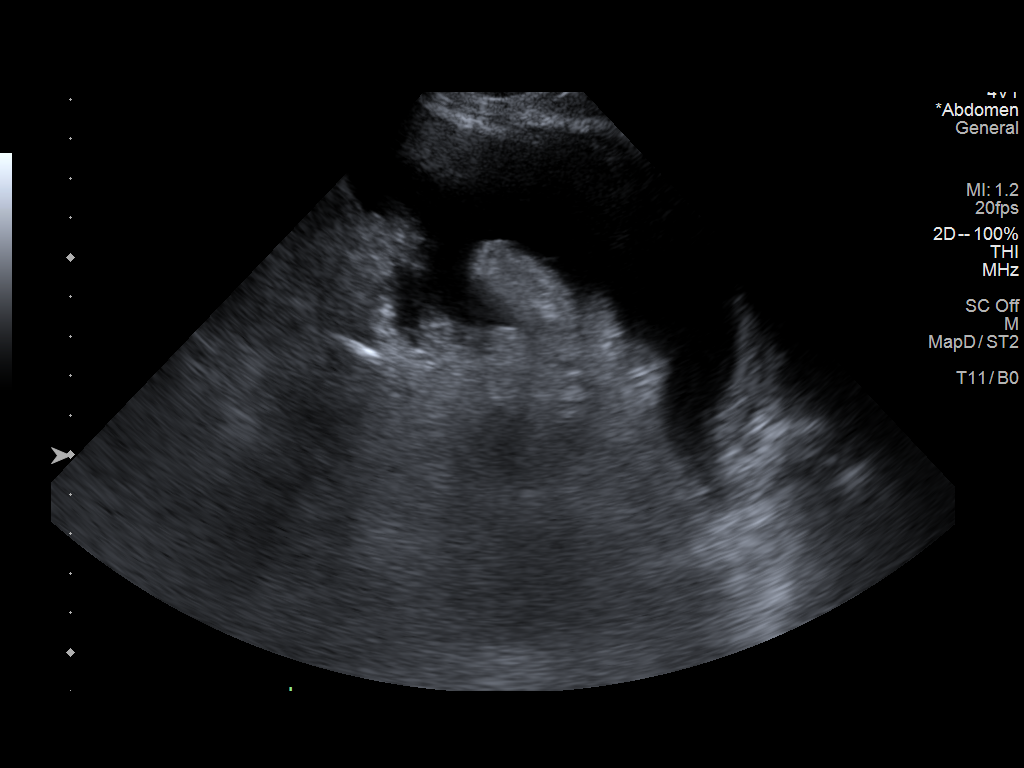
[im 3/3]
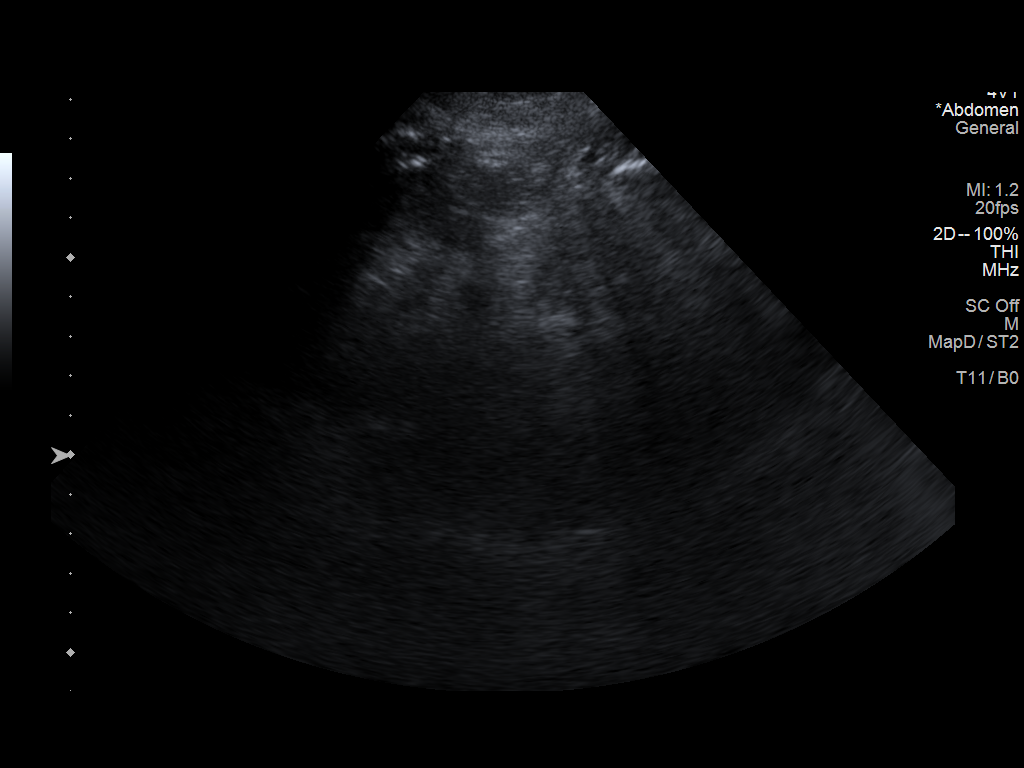

[3 of 3 positions shown; findings below may reference images not displayed]

Initial ultrasound scanning demonstrates a large amount of ascites
within the right lower abdomen which was subsequently prepped and
draped in the usual sterile fashion. 1% lidocaine with epinephrine
was used for local anesthesia. An ultrasound image was saved for
documentation purposed. An 8 Fr Safe-T-Centesis catheter was
introduced. The paracentesis was performed. The catheter was removed
and a dressing was applied. The patient tolerated the procedure well
without immediate post procedural complication.
FINDINGS: A total of approximately 4 liters of dark yellow serous fluid was
removed. Samples were sent to the laboratory as requested by the
clinical team.
IMPRESSION: Successful ultrasound-guided paracentesis yielding 4 liters of
peritoneal fluid.

## 2022-01-18 IMAGING — CT CT ABD-PELV W/ CM
2 of 5 series · 16 of 46 positions shown, 18 images · IV contrast (omnipaque)
Comparison: None.

CLINICAL DATA: Abdominal pain for several days with nausea, initial
encounter

EXAM:
CT ABDOMEN AND PELVIS WITH CONTRAST
TECHNIQUE: Multidetector CT imaging of the abdomen and pelvis was performed
using the standard protocol following bolus administration of
intravenous contrast.
CONTRAST:  75mL OMNIPAQUE IOHEXOL 300 MG/ML  SOLN

[Series 2: routine abd/pel with · axial · 0.91mm/px · z∈[-1125,-710]mm · 13 of 95 slices shown, 15 images]
[im 6/95  soft-tissue]
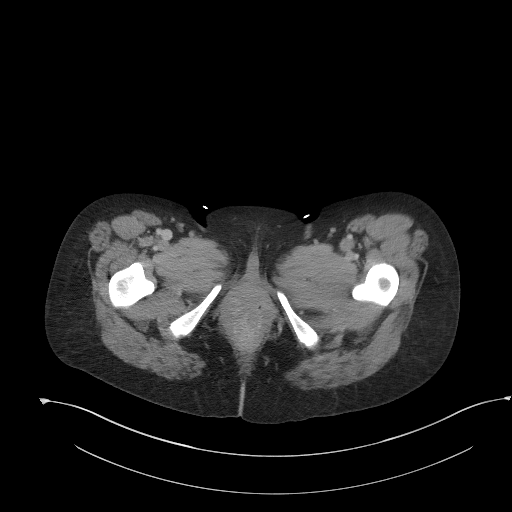
[im 6/95  bone]
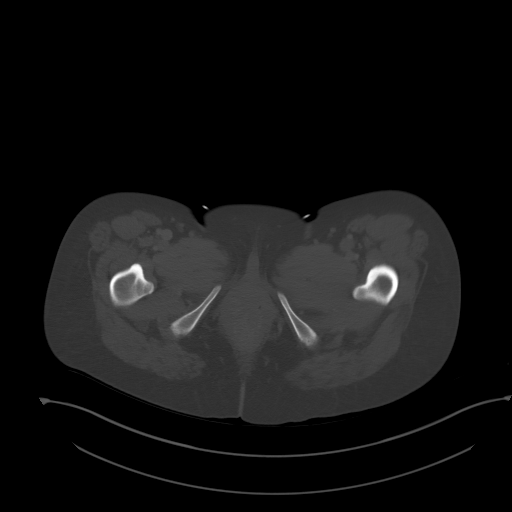
[im 11/95  soft-tissue]
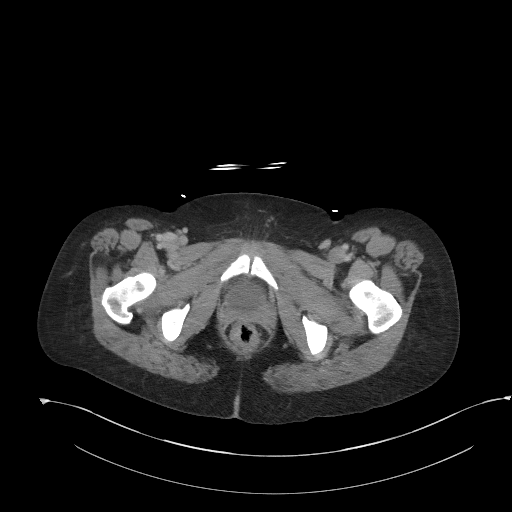
[im 21/95  soft-tissue]
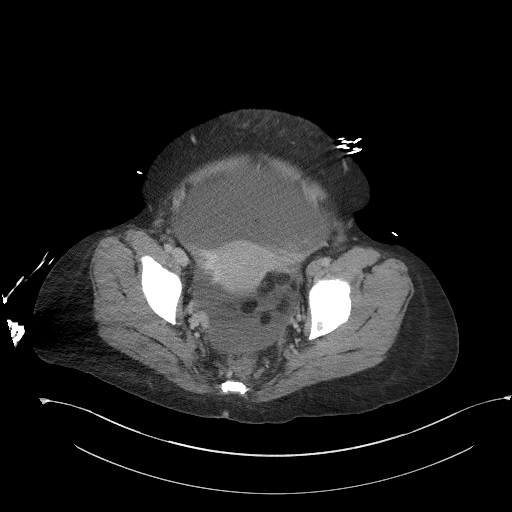
[im 27/95  soft-tissue]
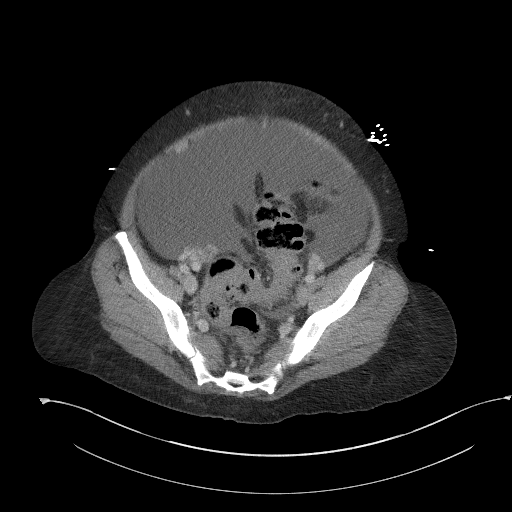
[im 32/95  soft-tissue]
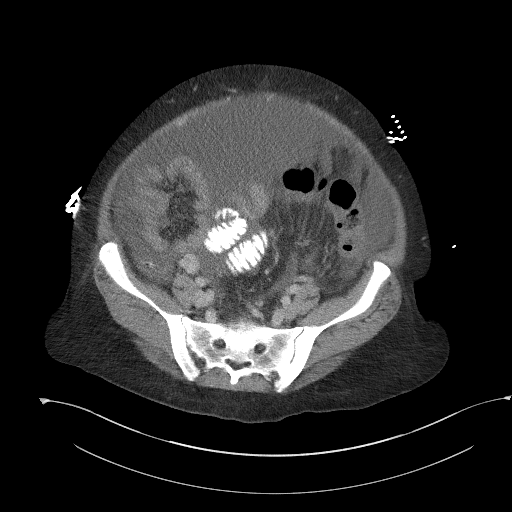
[im 42/95  soft-tissue]
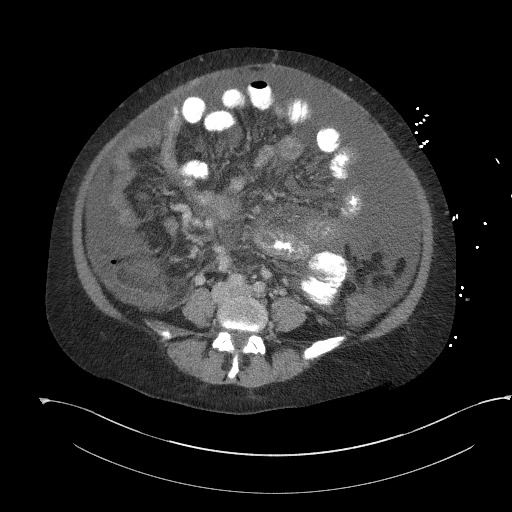
[im 48/95  soft-tissue]
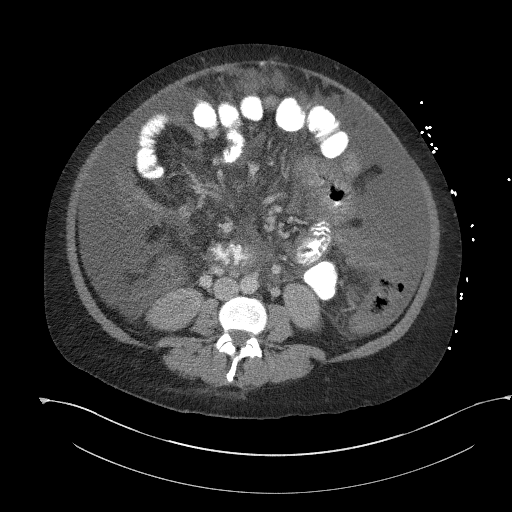
[im 53/95  soft-tissue]
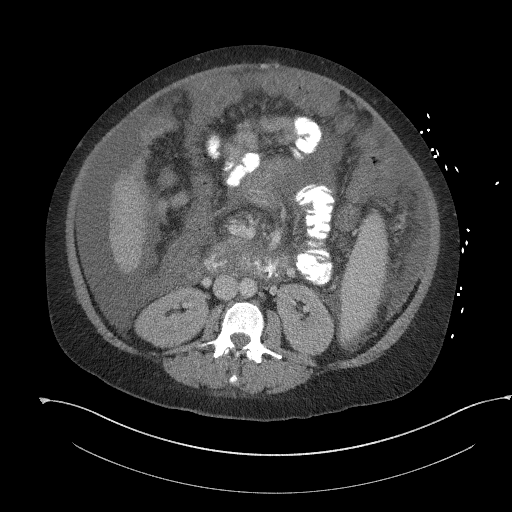
[im 63/95  soft-tissue]
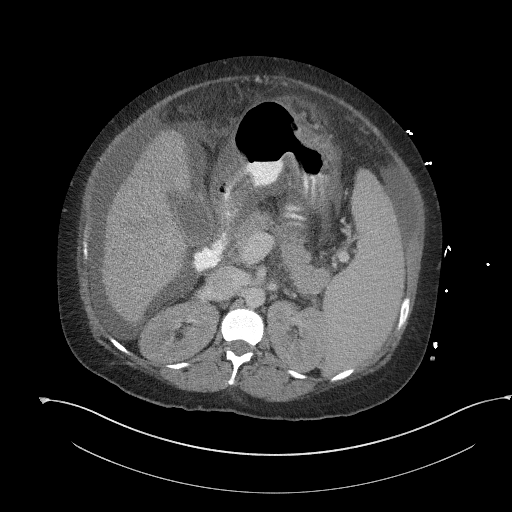
[im 63/95  bone]
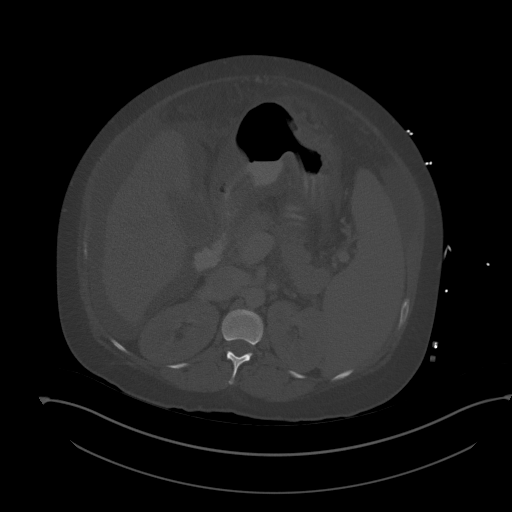
[im 68/95  soft-tissue]
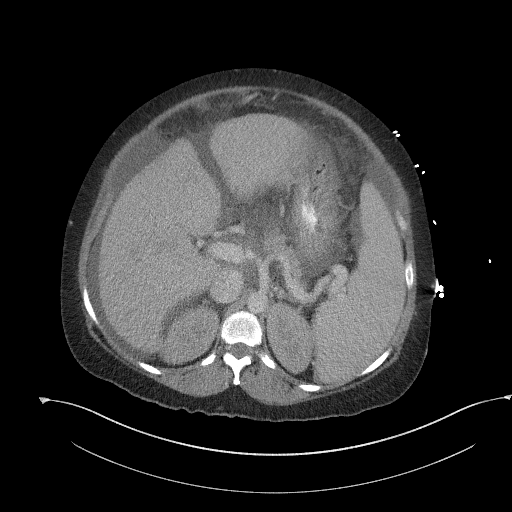
[im 74/95  soft-tissue]
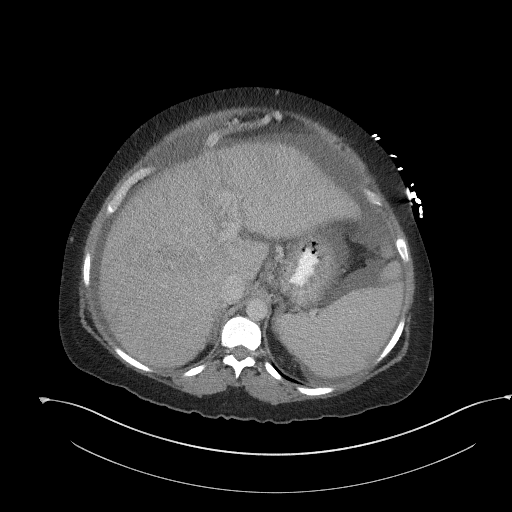
[im 84/95  soft-tissue]
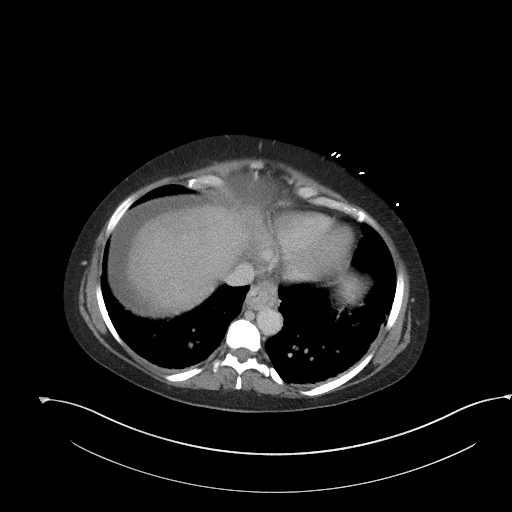
[im 89/95  soft-tissue]
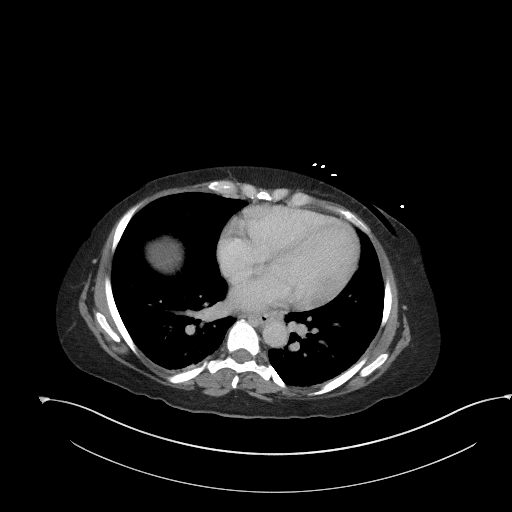

[Series 5: coronal st · coronal · 0.79mm/px · 3 of 108 slices shown]
[im 36/108  soft-tissue]
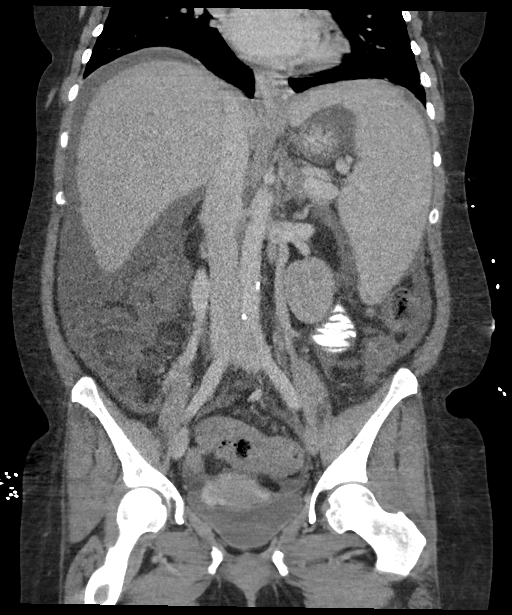
[im 48/108  soft-tissue]
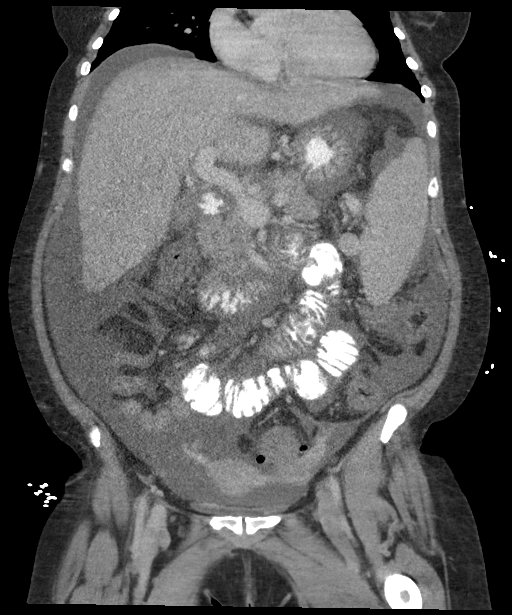
[im 60/108  soft-tissue]
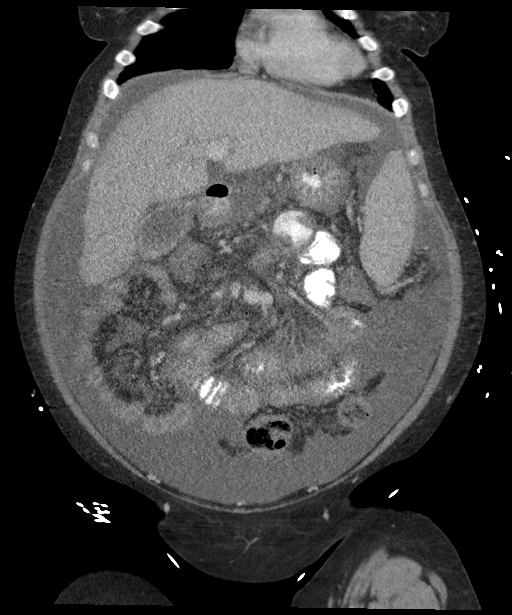

[16 of 46 positions shown; findings below may reference images not displayed]

FINDINGS: Lower chest: No acute abnormality.

Hepatobiliary: Some patchy areas of decreased attenuation are noted
within the liver. Underlying metastatic disease could not be totally
excluded. These changes could be related to underlying cirrhosis.
Recanalization of the umbilical vein is noted. Generalized ascites
is seen. Gallbladder demonstrates dependent density consistent with
small stones.

Pancreas: Pancreas is within normal limits.

Spleen: Spleen is enlarged without focal mass consistent with
underlying portal hypertension.

Adrenals/Urinary Tract: Adrenal glands are within normal limits.
Kidneys demonstrate a normal enhancement pattern. No calculi are
seen. Delayed images demonstrate no significant excretion of
contrast which may be related to some underlying renal failure.

Stomach/Bowel: Colon is well visualized. Diverticular changes noted
without diverticulitis. No obstructive or inflammatory changes are
seen. The appendix is not well visualized. Small bowel is
unremarkable. Stomach is within normal limits. Esophageal varices
are noted distally consistent with the underlying cirrhotic change.

Vascular/Lymphatic: Aortic atherosclerosis. No enlarged abdominal or
pelvic lymph nodes.

Reproductive: Uterus is within normal limits. Prominence of the
ovarian veins is seen consistent with some pelvic varices. No
adnexal mass is noted.

Other: Moderate ascites is seen consistent with the underlying
cirrhosis.

Musculoskeletal: No acute or significant osseous findings.
IMPRESSION: Changes consistent with cirrhosis of the liver with portal
hypertension. Associated splenomegaly, ascites and varices are
noted.

Multiple areas of decreased attenuation within the liver
incompletely evaluated on this exam. These may represent changes of
cirrhosis and regenerating nodules. Underlying neoplasm could not be
totally excluded.

Cholelithiasis.

Diverticulosis without diverticulitis.

## 2022-07-17 IMAGING — CR DG CHEST 2V
2 series · 2 of 2 positions shown · non-contrast
Comparison: 05/26/2020

CLINICAL DATA: Hemoptysis

EXAM:
CHEST - 2 VIEW

[chest lat]
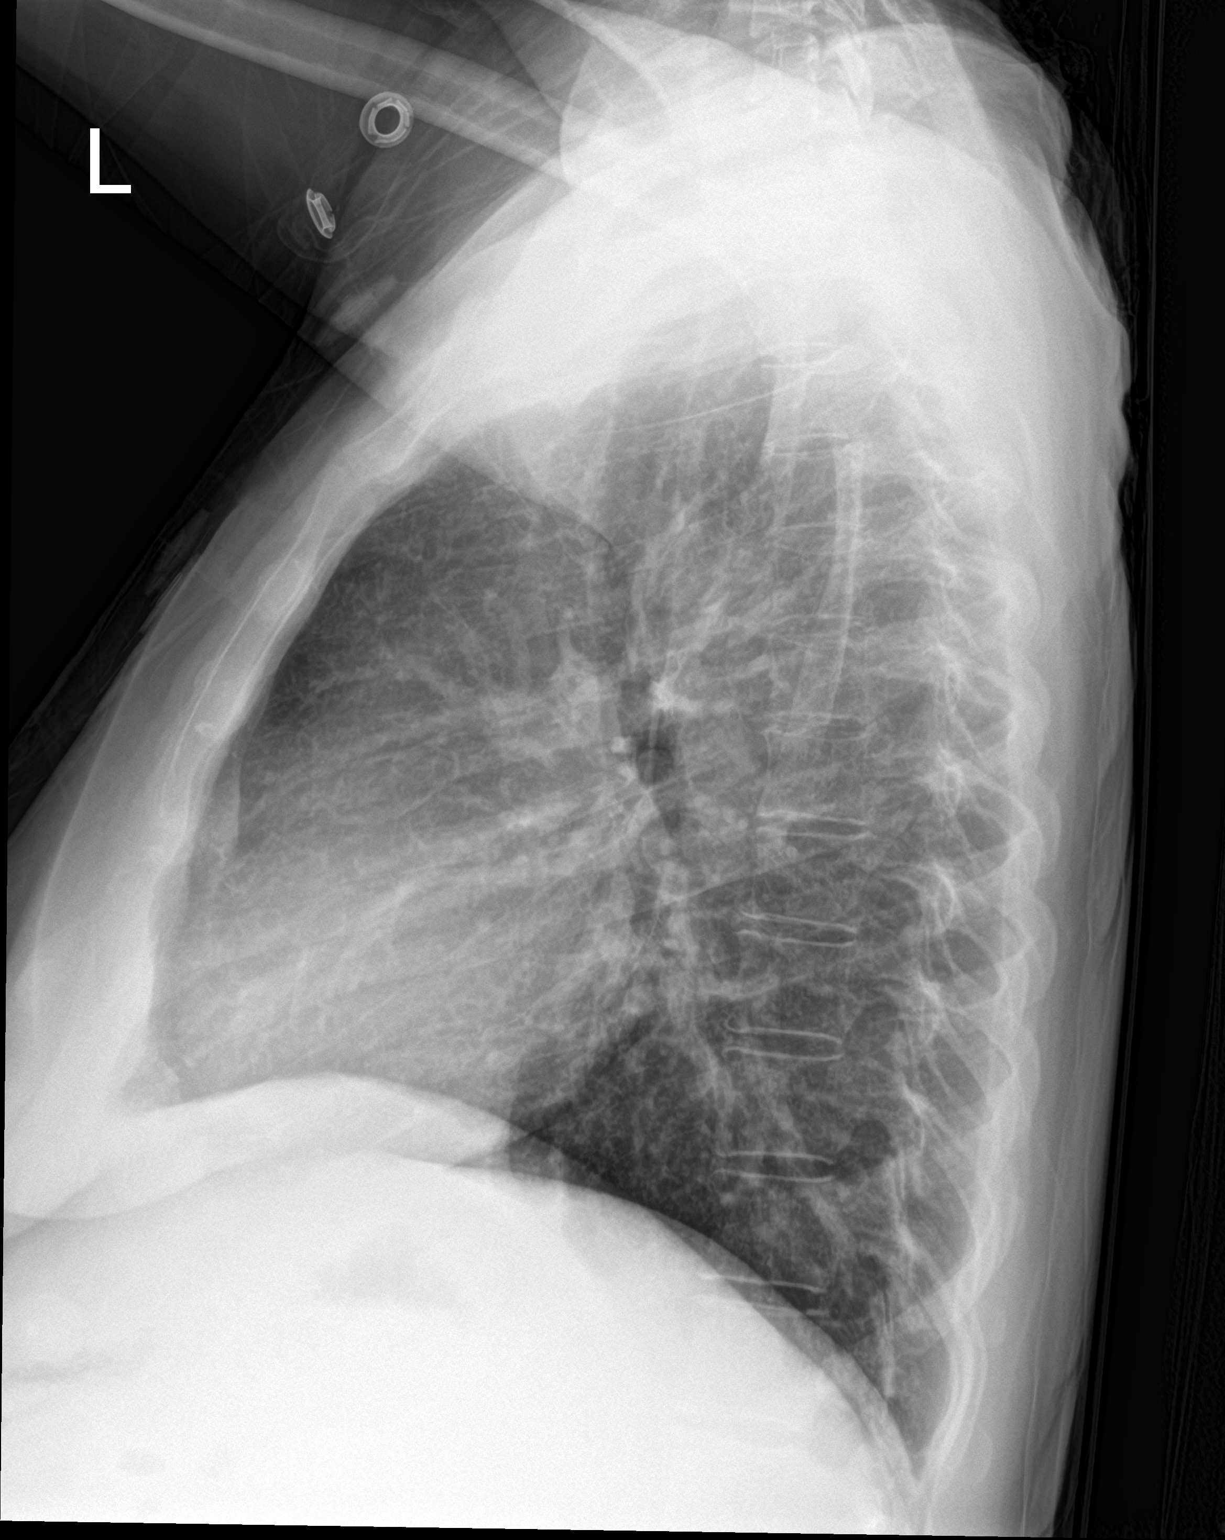

[chest ap]
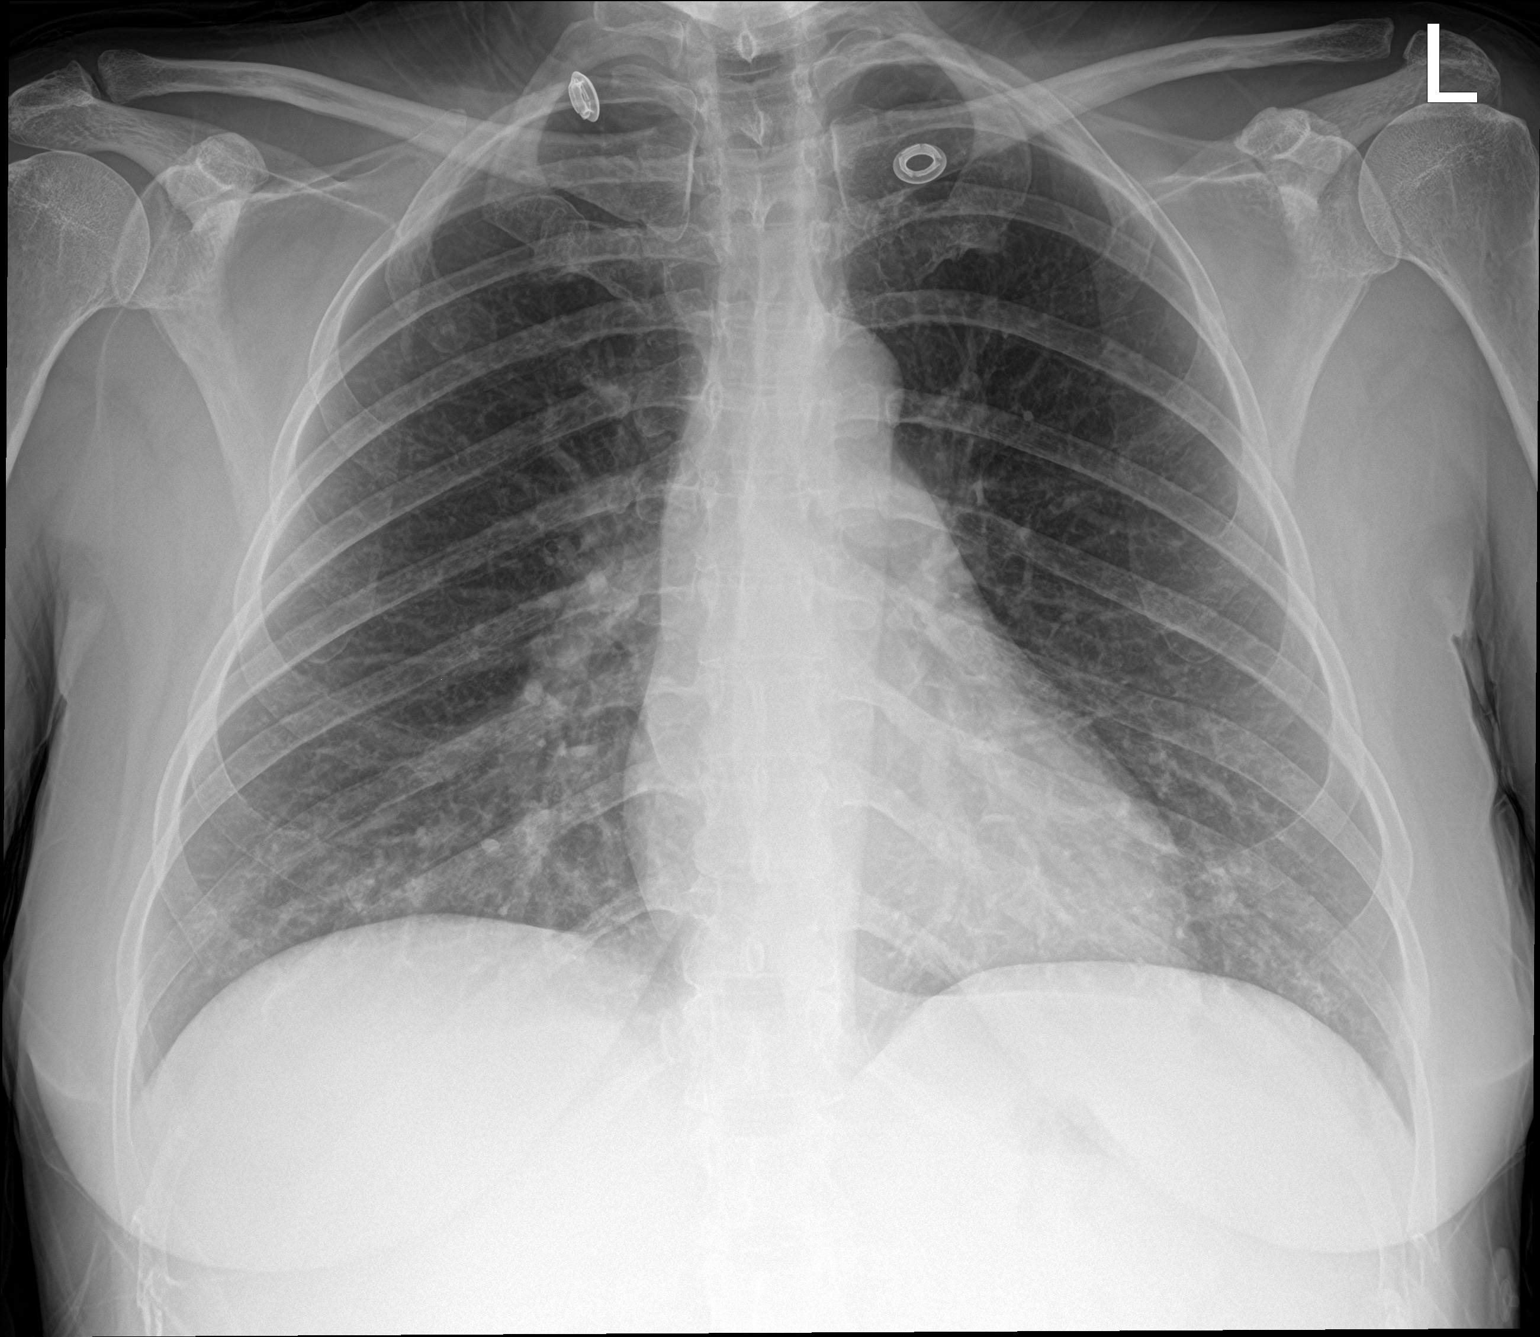

[2 of 2 positions shown; findings below may reference images not displayed]

FINDINGS: The heart size and mediastinal contours are within normal limits.
Both lungs are clear. The visualized skeletal structures are
unremarkable.
IMPRESSION: No active cardiopulmonary disease.

## 2022-07-18 IMAGING — US US PARACENTESIS
1 series · 3 of 3 positions shown · non-contrast
Comparison: none

INDICATION: Patient with history of alcoholic cirrhosis with recurrent ascites.
Request is for therapeutic and diagnostic paracentesis

[Series 1: us paracentesis · 3 of 3 slices shown]
[im 1/3]
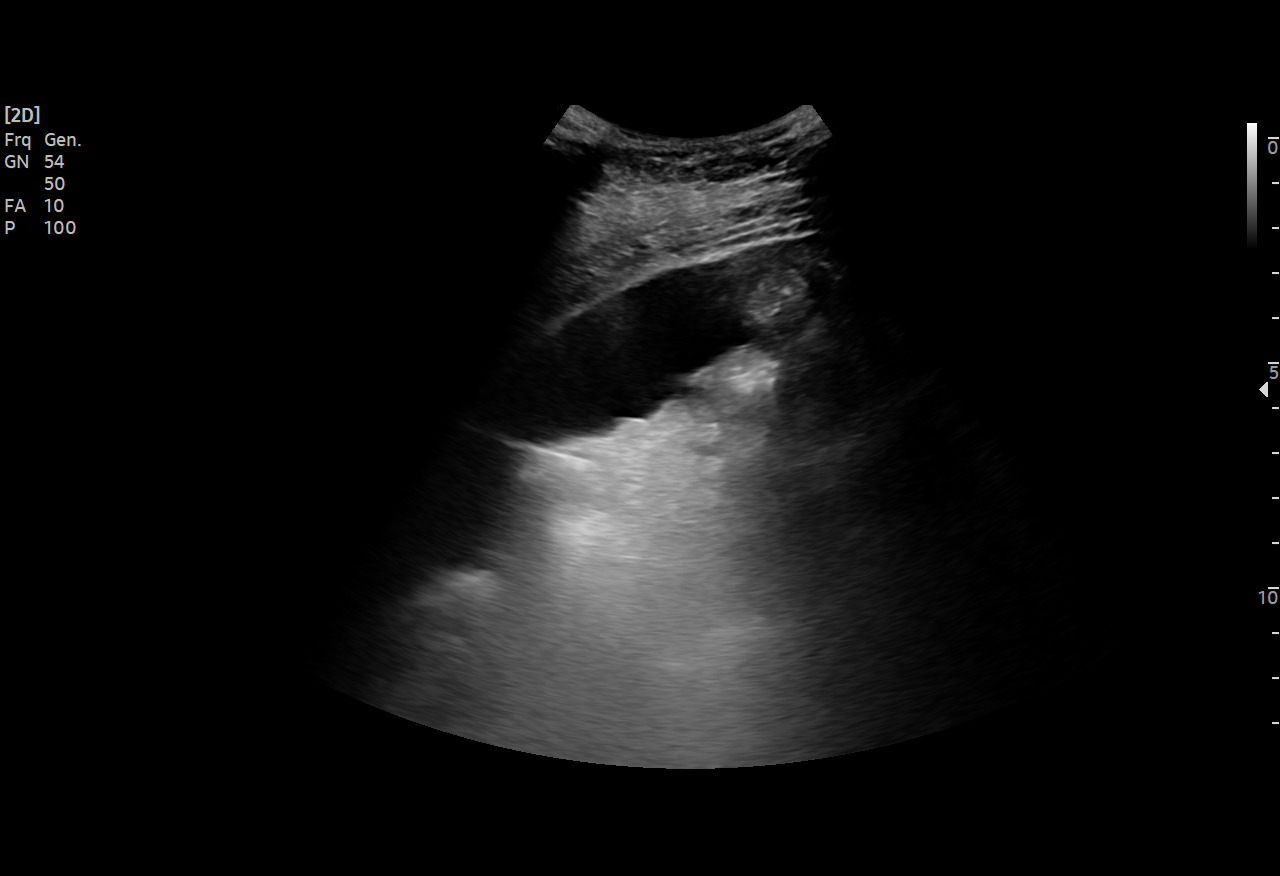
[im 2/3]
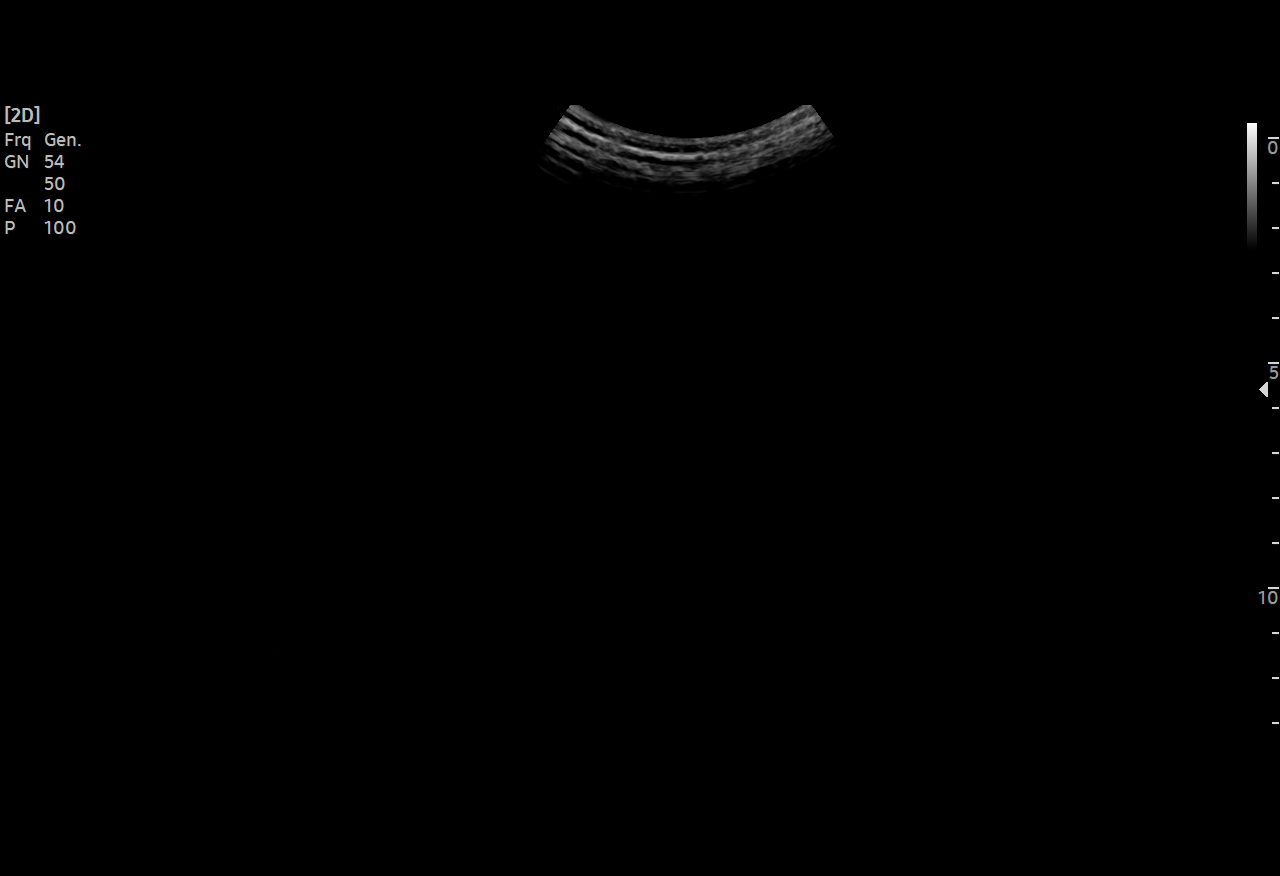
[im 3/3]
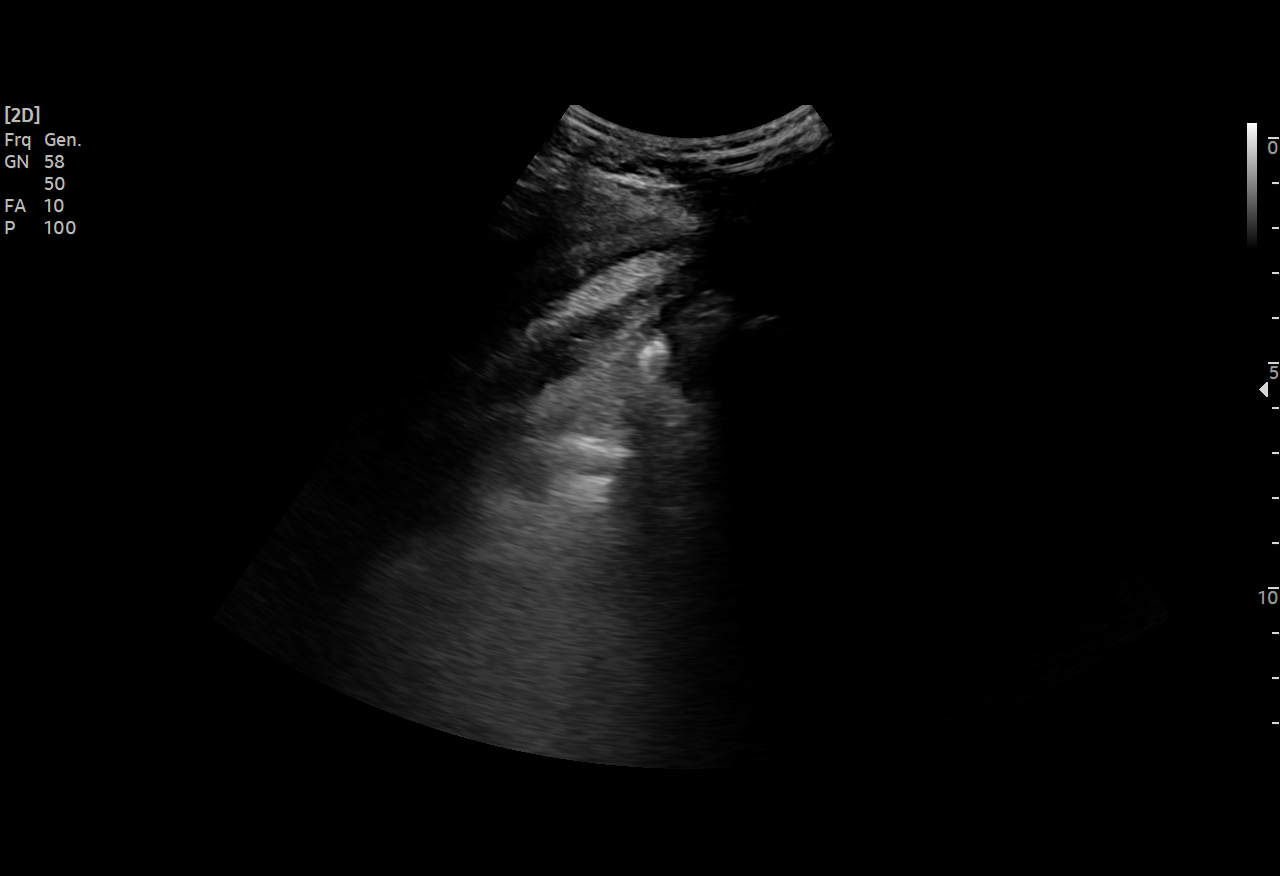

[3 of 3 positions shown; findings below may reference images not displayed]

EXAM:
ULTRASOUND GUIDED THERAPEUTIC AND DIAGNOSTIC PARACENTESIS

MEDICATIONS:
Lidocaine 1% 15 mL

COMPLICATIONS:
None immediate.

PROCEDURE:
Informed written consent was obtained from the patient after a
discussion of the risks, benefits and alternatives to treatment. A
timeout was performed prior to the initiation of the procedure.

Initial ultrasound scanning demonstrates a small amount of ascites
within the right lower abdominal quadrant. The right lower abdomen
was prepped and draped in the usual sterile fashion. 1% lidocaine
was used for local anesthesia.

Following this, a 19 gauge, 10-cm, Yueh catheter was introduced. An
ultrasound image was saved for documentation purposes. The
paracentesis was performed. The catheter was removed and Dermabond
was applied. The patient tolerated the procedure well without
immediate post procedural complication.
FINDINGS: A total of approximately 350 mL of amber colored fluid was removed.
Samples were sent to the laboratory as requested by the clinical
team.
IMPRESSION: Successful ultrasound-guided therapeutic paracentesis yielding 350
mL of peritoneal fluid.

Read by: Kala Alexander, NP

## 2022-10-23 ENCOUNTER — Other Ambulatory Visit: Payer: Self-pay

## 2022-10-23 ENCOUNTER — Emergency Department (HOSPITAL_COMMUNITY): Payer: Medicaid Other

## 2022-10-23 ENCOUNTER — Emergency Department (HOSPITAL_COMMUNITY)
Admission: EM | Admit: 2022-10-23 | Discharge: 2022-10-23 | Disposition: A | Discharge status: Other Acute Inpatient Hospital | Payer: Medicaid Other | Attending: Student | Admitting: Student

## 2022-10-23 DIAGNOSIS — N9489 Other specified conditions associated with female genital organs and menstrual cycle: Secondary | ICD-10-CM | POA: Insufficient documentation

## 2022-10-23 DIAGNOSIS — R109 Unspecified abdominal pain: Secondary | ICD-10-CM | POA: Insufficient documentation

## 2022-10-23 DIAGNOSIS — R112 Nausea with vomiting, unspecified: Secondary | ICD-10-CM | POA: Diagnosis not present

## 2022-10-23 DIAGNOSIS — T8641 Liver transplant rejection: Secondary | ICD-10-CM

## 2022-10-23 DIAGNOSIS — R17 Unspecified jaundice: Secondary | ICD-10-CM | POA: Insufficient documentation

## 2022-10-23 DIAGNOSIS — Z87891 Personal history of nicotine dependence: Secondary | ICD-10-CM | POA: Diagnosis not present

## 2022-10-23 DIAGNOSIS — Z760 Encounter for issue of repeat prescription: Secondary | ICD-10-CM | POA: Insufficient documentation

## 2022-10-23 LAB — COMPREHENSIVE METABOLIC PANEL
ALT: 1208 U/L — ABNORMAL HIGH (ref 0–44)
AST: 921 U/L — ABNORMAL HIGH (ref 15–41)
Albumin: 3.8 g/dL (ref 3.5–5.0)
Alkaline Phosphatase: 559 U/L — ABNORMAL HIGH (ref 38–126)
Anion gap: 13 (ref 5–15)
BUN: 18 mg/dL (ref 6–20)
CO2: 26 mmol/L (ref 22–32)
Calcium: 9.4 mg/dL (ref 8.9–10.3)
Chloride: 95 mmol/L — ABNORMAL LOW (ref 98–111)
Creatinine, Ser: 1.55 mg/dL — ABNORMAL HIGH (ref 0.44–1.00)
GFR, Estimated: 42 mL/min — ABNORMAL LOW (ref >60)
Glucose, Bld: 111 mg/dL — ABNORMAL HIGH (ref 70–99)
Potassium: 4.3 mmol/L (ref 3.5–5.1)
Sodium: 134 mmol/L — ABNORMAL LOW (ref 135–145)
Total Bilirubin: 6.7 mg/dL — ABNORMAL HIGH (ref 0.3–1.2)
Total Protein: 6.6 g/dL (ref 6.5–8.1)

## 2022-10-23 LAB — URINALYSIS, ROUTINE W REFLEX MICROSCOPIC
Glucose, UA: NEGATIVE mg/dL
Hgb urine dipstick: NEGATIVE
Ketones, ur: NEGATIVE mg/dL
Leukocytes,Ua: NEGATIVE
Nitrite: NEGATIVE
Protein, ur: 30 mg/dL — AB
Specific Gravity, Urine: 1.027 (ref 1.005–1.030)
pH: 5 (ref 5.0–8.0)

## 2022-10-23 LAB — LIPASE, BLOOD: Lipase: 22 U/L (ref 11–51)

## 2022-10-23 LAB — CBC
HCT: 38.9 % (ref 36.0–46.0)
Hemoglobin: 13.1 g/dL (ref 12.0–15.0)
MCH: 30.1 pg (ref 26.0–34.0)
MCHC: 33.7 g/dL (ref 30.0–36.0)
MCV: 89.4 fL (ref 80.0–100.0)
Platelets: 61 10*3/uL — ABNORMAL LOW (ref 150–400)
RBC: 4.35 MIL/uL (ref 3.87–5.11)
RDW: 13.3 % (ref 11.5–15.5)
WBC: 6.6 10*3/uL (ref 4.0–10.5)
nRBC: 0 % (ref 0.0–0.2)

## 2022-10-23 LAB — HEPATITIS PANEL, ACUTE
HCV Ab: NONREACTIVE
Hep A IgM: NONREACTIVE
Hep B C IgM: NONREACTIVE
Hepatitis B Surface Ag: NONREACTIVE

## 2022-10-23 LAB — AMMONIA: Ammonia: 32 umol/L (ref 9–35)

## 2022-10-23 LAB — I-STAT BETA HCG BLOOD, ED (MC, WL, AP ONLY): I-stat hCG, quantitative: <5 m[IU]/mL (ref <5)

## 2022-10-23 LAB — MAGNESIUM: Magnesium: 2 mg/dL (ref 1.7–2.4)

## 2022-10-23 LAB — PROTIME-INR
INR: 1.2 (ref 0.8–1.2)
Prothrombin Time: 14.8 seconds (ref 11.4–15.2)

## 2022-10-23 MED ORDER — MORPHINE SULFATE (PF) 4 MG/ML IV SOLN
4.0000 mg | Freq: Once | INTRAVENOUS | Status: AC
  Administered 2022-10-23: 4 mg via INTRAVENOUS
  Filled 2022-10-23: qty 1

## 2022-10-23 MED ORDER — IOHEXOL 350 MG/ML SOLN
75.0000 mL | Freq: Once | INTRAVENOUS | Status: AC | PRN
  Administered 2022-10-23: 75 mL via INTRAVENOUS

## 2022-10-23 MED ORDER — LACTATED RINGERS IV BOLUS
1000.0000 mL | Freq: Once | INTRAVENOUS | Status: AC
  Administered 2022-10-23: 1000 mL via INTRAVENOUS

## 2022-10-23 MED ORDER — ONDANSETRON 4 MG PO TBDP
4.0000 mg | ORAL_TABLET | Freq: Once | ORAL | Status: AC
  Administered 2022-10-23: 4 mg via ORAL
  Filled 2022-10-23: qty 1

## 2022-10-23 MED ORDER — ONDANSETRON HCL 4 MG/2ML IJ SOLN
4.0000 mg | Freq: Once | INTRAMUSCULAR | Status: AC
  Administered 2022-10-23: 4 mg via INTRAVENOUS
  Filled 2022-10-23: qty 2

## 2022-10-23 NOTE — ED Provider Triage Note (Signed)
Emergency Medicine Provider Triage Evaluation Note  Lindsey Huang , a 41 y.o. female  was evaluated in triage.  Pt complains of RUQ abdominal pain, stabbing lower abdominal pain, nausea and vomiting. Liver transplant recipient. Symptoms started 4 days ago. Talked to her doctor who wanted her to come in for evaluation since outpatient blood work centers are closed on the weekends. Been out of her medicine for about a month, but provider didn't think that could cause her symptoms that quickly.   Review of Systems  Positive: Abd pain, nausea, vomiting, dysuria, jaundice Negative: Fever, chills, diarrhea  Physical Exam  BP 110/86 (BP Location: Right Arm)   Pulse 79   Temp 98 F (36.7 C) (Oral)   Resp 16   SpO2 100%  Gen:   Awake, no distress   Resp:  Normal effort  MSK:   Moves extremities without difficulty  Other:  Jaundice noted, scleral icterus  Medical Decision Making  Medically screening exam initiated at 3:44 PM.  Appropriate orders placed.  Ellin Fitzgibbons was informed that the remainder of the evaluation will be completed by another provider, this initial triage assessment does not replace that evaluation, and the importance of remaining in the ED until their evaluation is complete.  Workup initiated, zofran given in triage   Su Monks, PA-C 10/23/22 1546

## 2022-10-23 NOTE — ED Notes (Signed)
Patient transported to CT 

## 2022-10-23 NOTE — ED Notes (Signed)
Daughter Danyell Shader [phone] wants an update immediately

## 2022-10-23 NOTE — ED Notes (Signed)
Ultrasound at bedside

## 2022-10-23 NOTE — ED Triage Notes (Signed)
Pt states she is a liver transplant patient.  Has had some jaundice, right lower quadrant pain, n/v for the past several days.  Has not been able to hold down solid food.  No fever, chills, CP, or shob.

## 2022-10-23 NOTE — ED Notes (Signed)
Pt declines giving an update to any family and friends

## 2022-10-23 NOTE — ED Provider Notes (Signed)
[facility] EMERGENCY DEPARTMENT Provider Note  CSN: [id number] Arrival date & time: 10/23/22 1438  Chief Complaint(s) Abdominal Pain and Emesis  HPI Lindsey Huang is a 41 y.o. 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 Angela Adam currently on tacrolimus and mycophenolate who presents emergency department for evaluation of abdominal pain, jaundice, nausea, vomiting.  Pain worse in the right upper quadrant and she states that symptoms started approximately 4 days ago.  She spoke with her primary liver transplant doctor Dr. Angela Adam who wanted her to come get urgent blood work.  She states that she has been out of her Prograf and mycophenolate for approximately 1 month and the pharmacy is reportedly would not refill this medication when she attempted to get a refill.  Denies chest pain, shortness of breath, headache, fever or other systemic symptoms.   Past Medical History Past Medical History:  Diagnosis Date   Blood transfusion without reported diagnosis 2010   misscarriage   Cirrhosis ([place])    Hx of incompetent cervix, currently pregnant    Medical history non-contributory    Portal hypertensive gastropathy ([place])    Thrombocytopenia ([place])    Patient Active Problem List   Diagnosis Date Noted   Thrombocytopenia ([employer] [employer]) 11/23/2020   ABLA (acute blood loss anemia) 11/23/2020   GI bleed 11/23/2020   Spontaneous bacterial peritonitis ([place]) 59/29/2446   Alcoholic cirrhosis of liver with ascites ([place]) 05/27/2020   SBP (spontaneous bacterial peritonitis) ([place]) 05/27/2020   Hyponatremia 05/27/2020   Hyperbilirubinemia 05/27/2020   Normocytic anemia 05/27/2020   Decompensated hepatic cirrhosis ([place]) 10/18/2019   Hypokalemia 10/18/2019   Coagulopathy ([place]) 10/18/2019   Total bilirubin, elevated 10/18/2019   Hematemesis 10/18/2019   Liver failure ([place]) 09/11/2019   Indication for care in labor or delivery 12/25/2013   NVD  (normal vaginal delivery) 12/25/2013   Home Medication(s) Prior to Admission medications   Medication Sig Start Date End Date Taking? Authorizing Provider  albumin human 25 % bottle Inject 100 mLs (25 g total) into the vein every 6 (six) hours. 01/10/21   Danford, Suann Larry, MD  feeding supplement (ENSURE ENLIVE / ENSURE PLUS) LIQD Take 237 mLs by mouth 2 (two) times daily between meals. 01/10/21   Danford, Suann Larry, MD  ferrous sulfate 325 (65 FE) MG tablet Take 1 tablet (325 mg total) by mouth daily with breakfast. 10/09/20   Ezekiel Slocumb, DO  folic acid (FOLVITE) 1 MG tablet Take 1 tablet (1 mg total) by mouth daily. 01/11/21   Danford, Suann Larry, MD  furosemide (LASIX) 20 MG tablet Take 3 tablets (60 mg total) by mouth daily. 10/10/20   Ezekiel Slocumb, DO  lactulose (CHRONULAC) 10 GM/15ML solution Take 15 mLs (10 g total) by mouth 2 (two) times daily. 11/25/20   Fritzi Mandes, MD  midodrine (PROAMATINE) 10 MG tablet Take 1 tablet (10 mg total) by mouth 3 (three) times daily with meals. 01/10/21   Danford, Suann Larry, MD  Multiple Vitamin (MULTIVITAMIN WITH MINERALS) TABS tablet Take 1 tablet by mouth daily.    [provider]  norepinephrine (LEVOPHED) 4-5 MG/250ML-% SOLN Inject 2-10 mcg/min into the vein continuous. 01/10/21   Danford, Suann Larry, MD  ondansetron (ZOFRAN) 4 MG tablet Take 1 tablet (4 mg total) by mouth every 6 (six) hours as needed for nausea. 01/10/21   Danford, Suann Larry, MD  ondansetron (ZOFRAN) 4 MG/2ML SOLN injection Inject 2 mLs (4 mg total) into the vein every 6 (  six) hours as needed for nausea. 01/10/21   Danford, Suann Larry, MD  oxyCODONE 10 MG TABS Take 1 tablet (10 mg total) by mouth every 4 (four) hours as needed for moderate pain. 01/10/21   Danford, Suann Larry, MD  pantoprazole (PROTONIX) 40 MG tablet Take 1 tablet (40 mg total) by mouth daily. 11/25/20   Fritzi Mandes, MD  piperacillin-tazobactam (ZOSYN) 3.375 GM/50ML IVPB Inject 50  mLs (3.375 g total) into the vein every 8 (eight) hours. 01/10/21   Danford, Suann Larry, MD  potassium chloride SA (KLOR-CON) 10 MEQ tablet Take 1 tablet (10 mEq total) by mouth daily. 10/10/20   Ezekiel Slocumb, DO  rifaximin (XIFAXAN) 550 MG TABS tablet Take 1 tablet (550 mg total) by mouth 2 (two) times daily. 01/10/21   Danford, Suann Larry, MD  spironolactone (ALDACTONE) 50 MG tablet Take 1 tablet (50 mg total) by mouth daily. 01/11/21   Danford, Suann Larry, MD  thiamine 100 MG tablet Take 100 mg by mouth daily.    [provider]  traZODone (DESYREL) 100 MG tablet Take 1 tablet (100 mg total) by mouth at bedtime as needed for sleep. 01/10/21   Danford, Suann Larry, MD  vitamin B-12 (CYANOCOBALAMIN) 1000 MCG tablet Take 1,000 mcg by mouth daily.    [provider]                                                                                                                                    Past Surgical History Past Surgical History:  Procedure Laterality Date   CERVICAL CERCLAGE     x4   ESOPHAGOGASTRODUODENOSCOPY (EGD) WITH PROPOFOL N/A 10/19/2019   Procedure: ESOPHAGOGASTRODUODENOSCOPY (EGD) WITH PROPOFOL;  Surgeon: Jonathon Bellows, MD;  Location: [facility] ENDOSCOPY;  Service: Gastroenterology;  Laterality: N/A;   ESOPHAGOGASTRODUODENOSCOPY (EGD) WITH PROPOFOL N/A 11/24/2020   Procedure: ESOPHAGOGASTRODUODENOSCOPY (EGD) WITH PROPOFOL;  Surgeon: Lesly Rubenstein, MD;  Location: [facility] ENDOSCOPY;  Service: Endoscopy;  Laterality: N/A;  Would like to do first case of the day so before the first outpatient.   Family History Family History  Problem Relation Age of Onset   Depression Maternal Aunt    Alcohol abuse Neg Hx    Arthritis Neg Hx    Asthma Neg Hx    Birth defects Neg Hx    Cancer Neg Hx    COPD Neg Hx    Drug abuse Neg Hx    Early death Neg Hx    Diabetes Neg Hx    Hearing loss Neg Hx    Heart disease Neg Hx    Hyperlipidemia Neg Hx    Hypertension  Neg Hx    Kidney disease Neg Hx    Learning disabilities Neg Hx    Mental illness Neg Hx    Mental retardation Neg Hx    Miscarriages / Stillbirths Neg Hx    Stroke Neg Hx    Vision loss  Neg Hx     Social History Social History   Tobacco Use   Smoking status: Former    Types: Cigarettes    Quit date: 08/28/2019    Years since quitting: 3.1   Smokeless tobacco: Never  Substance Use Topics   Alcohol use: Not Currently    Comment: stopped in 08/2019   Drug use: No   Allergies Patient has no known allergies.  Review of Systems Review of Systems  Gastrointestinal:  Positive for abdominal pain, nausea and vomiting.  Skin:  Positive for color change.    Physical Exam Vital Signs  I have reviewed the triage vital signs BP 103/82 (BP Location: Right Arm)   Pulse 77   Temp 97.8 F (36.6 C) (Oral)   Resp 16   SpO2 97%   Physical Exam Vitals and nursing note reviewed.  Constitutional:      General: She is not in acute distress.    Appearance: She is well-developed.  HENT:     Head: Normocephalic and atraumatic.  Eyes:     General: Scleral icterus present.     Conjunctiva/sclera: Conjunctivae normal.  Cardiovascular:     Rate and Rhythm: Normal rate and regular rhythm.     Heart sounds: No murmur heard. Pulmonary:     Effort: Pulmonary effort is normal. No respiratory distress.     Breath sounds: Normal breath sounds.  Abdominal:     Palpations: Abdomen is soft.     Tenderness: There is abdominal tenderness in the right upper quadrant.  Musculoskeletal:        General: No swelling.     Cervical back: Neck supple.  Skin:    General: Skin is warm and dry.     Capillary Refill: Capillary refill takes less than 2 seconds.     Coloration: Skin is jaundiced.  Neurological:     Mental Status: She is alert.  Psychiatric:        Mood and Affect: Mood normal.     ED Results and Treatments Labs (all labs ordered are listed, but only abnormal results are  displayed) Labs Reviewed  COMPREHENSIVE METABOLIC PANEL - Abnormal; Notable for the following components:      Result Value   Sodium 134 (*)    Chloride 95 (*)    Glucose, Bld 111 (*)    Creatinine, Ser 1.55 (*)    AST 921 (*)    ALT 1,208 (*)    Alkaline Phosphatase 559 (*)    Total Bilirubin 6.7 (*)    GFR, Estimated 42 (*)    All other components within normal limits  CBC - Abnormal; Notable for the following components:   Platelets 61 (*)    All other components within normal limits  URINALYSIS, ROUTINE W REFLEX MICROSCOPIC - Abnormal; Notable for the following components:   Color, Urine AMBER (*)    APPearance CLOUDY (*)    Bilirubin Urine MODERATE (*)    Protein, ur 30 (*)    Bacteria, UA FEW (*)    Non Squamous Epithelial 0-5 (*)    All other components within normal limits  LIPASE, BLOOD  PROTIME-INR  AMMONIA  HEPATITIS PANEL, ACUTE  MAGNESIUM  I-STAT BETA HCG BLOOD, ED ([facility], [facility], [facility] ONLY)  Radiology No results found.  Pertinent labs & imaging results that were available during my care of the patient were reviewed by me and considered in my medical decision making (see MDM for details).  Medications Ordered in ED Medications  morphine (PF) 4 MG/ML injection 4 mg (has no administration in time range)  ondansetron (ZOFRAN) injection 4 mg (has no administration in time range)  ondansetron (ZOFRAN-ODT) disintegrating tablet 4 mg (4 mg Oral Given 10/23/22 1548)                                                                                                                                     Procedures Procedures  (including critical care time)  Medical Decision Making / ED Course   This patient presents to the ED for concern of abdominal pain, jaundice, vomiting, this involves an extensive number of treatment options, and is a complaint that  carries with it a high risk of complications and morbidity.  The differential diagnosis includes choledocholithiasis, pancreatitis, pancreatic malignancy, liver transplantation failure, hemolytic anemia  MDM: Patient seen emergency room for evaluation of abdominal pain, nausea, vomiting and jaundice.  Physical exam reveals scleral icterus and jaundice of the skin with tenderness in the right upper quadrant.  Laboratory evaluation is concerning with a new severe transaminitis with AST 921, ALT 1208, alk phos 559, total bilirubin 6.7 which all appear to be acute changes for this patient.  Laboratory evaluation otherwise unremarkable including a normal INR and normal ammonia.  CT abdomen pelvis without acute findings in the biliary tree.  The [facility] liver transplant team has been paged and the recommendations are currently pending.  Please see provider signout for continuation of workup.   Additional history obtained: -Additional history obtained from husband  -External records from outside source obtained and reviewed including: Chart review including previous notes, labs, imaging, consultation notes   Lab Tests: -I ordered, reviewed, and interpreted labs.   The pertinent results include:   Labs Reviewed  COMPREHENSIVE METABOLIC PANEL - Abnormal; Notable for the following components:      Result Value   Sodium 134 (*)    Chloride 95 (*)    Glucose, Bld 111 (*)    Creatinine, Ser 1.55 (*)    AST 921 (*)    ALT 1,208 (*)    Alkaline Phosphatase 559 (*)    Total Bilirubin 6.7 (*)    GFR, Estimated 42 (*)    All other components within normal limits  CBC - Abnormal; Notable for the following components:   Platelets 61 (*)    All other components within normal limits  URINALYSIS, ROUTINE W REFLEX MICROSCOPIC - Abnormal; Notable for the following components:   Color, Urine AMBER (*)    APPearance CLOUDY (*)    Bilirubin Urine MODERATE (*)    Protein, ur 30 (*)    Bacteria, UA FEW (*)    Non  Squamous Epithelial 0-5 (*)  All other components within normal limits  LIPASE, BLOOD  PROTIME-INR  AMMONIA  HEPATITIS PANEL, ACUTE  MAGNESIUM  I-STAT BETA HCG BLOOD, ED ([facility], [facility], [facility] ONLY)     Imaging Studies ordered: I ordered imaging studies including CTAP, RUQ [place] I independently visualized and interpreted imaging. I agree with the radiologist interpretation   Medicines ordered and prescription drug management: Meds ordered this encounter  Medications   ondansetron (ZOFRAN-ODT) disintegrating tablet 4 mg   morphine (PF) 4 MG/ML injection 4 mg   ondansetron (ZOFRAN) injection 4 mg    -I have reviewed the patients home medicines and have made adjustments as needed  Critical interventions none  Consultations Obtained: I requested consultation with the [facility] liver transplant team,  and discussed lab and imaging findings as well as pertinent plan - they recommend: Recommendations are pending   Cardiac Monitoring: The patient was maintained on a cardiac monitor.  I personally viewed and interpreted the cardiac monitored which showed an underlying rhythm of: NSR  Social Determinants of Health:  Factors impacting patients care include: Has been out of her antirejection medication and pharmacy reportedly would not refill these per patient   Reevaluation: After the interventions noted above, I reevaluated the patient and found that they have :improved  Co morbidities that complicate the patient evaluation  Past Medical History:  Diagnosis Date   Blood transfusion without reported diagnosis 2010   misscarriage   Cirrhosis ([place])    Hx of incompetent cervix, currently pregnant    Medical history non-contributory    Portal hypertensive gastropathy ([place])    Thrombocytopenia ([place])       Dispostion: I considered admission for this patient, and disposition pending recommendations from [facility].  Please see provider signout for continuation of workup.     Final Clinical  Impression(s) / ED Diagnoses Final diagnoses:  None     _0 @    Teressa Lower, MD 10/23/22 2047

## 2023-01-17 ENCOUNTER — Emergency Department
Admission: EM | Admit: 2023-01-17 | Discharge: 2023-01-18 | Disposition: A | Discharge status: Home / Self Care | Payer: Medicaid Other | Attending: Emergency Medicine | Admitting: Emergency Medicine

## 2023-01-17 DIAGNOSIS — F419 Anxiety disorder, unspecified: Secondary | ICD-10-CM | POA: Diagnosis not present

## 2023-01-17 DIAGNOSIS — R944 Abnormal results of kidney function studies: Secondary | ICD-10-CM | POA: Diagnosis not present

## 2023-01-17 DIAGNOSIS — R739 Hyperglycemia, unspecified: Secondary | ICD-10-CM | POA: Diagnosis present

## 2023-01-17 DIAGNOSIS — Z944 Liver transplant status: Secondary | ICD-10-CM | POA: Diagnosis not present

## 2023-01-17 DIAGNOSIS — T380X5A Adverse effect of glucocorticoids and synthetic analogues, initial encounter: Secondary | ICD-10-CM

## 2023-01-17 LAB — URINALYSIS, ROUTINE W REFLEX MICROSCOPIC
Bacteria, UA: NONE SEEN
Bilirubin Urine: NEGATIVE
Glucose, UA: >=500 mg/dL — AB
Ketones, ur: NEGATIVE mg/dL
Leukocytes,Ua: NEGATIVE
Nitrite: NEGATIVE
Protein, ur: NEGATIVE mg/dL
Specific Gravity, Urine: 1.024 (ref 1.005–1.030)
Squamous Epithelial / HPF: NONE SEEN /HPF (ref 0–5)
pH: 6 (ref 5.0–8.0)

## 2023-01-17 LAB — COMPREHENSIVE METABOLIC PANEL
ALT: 25 U/L (ref 0–44)
AST: 15 U/L (ref 15–41)
Albumin: 4.1 g/dL (ref 3.5–5.0)
Alkaline Phosphatase: 72 U/L (ref 38–126)
Anion gap: 11 (ref 5–15)
BUN: 48 mg/dL — ABNORMAL HIGH (ref 6–20)
CO2: 22 mmol/L (ref 22–32)
Calcium: 8.8 mg/dL — ABNORMAL LOW (ref 8.9–10.3)
Chloride: 94 mmol/L — ABNORMAL LOW (ref 98–111)
Creatinine, Ser: 1.37 mg/dL — ABNORMAL HIGH (ref 0.44–1.00)
GFR, Estimated: 49 mL/min — ABNORMAL LOW (ref >60)
Glucose, Bld: 649 mg/dL (ref 70–99)
Potassium: 5 mmol/L (ref 3.5–5.1)
Sodium: 127 mmol/L — ABNORMAL LOW (ref 135–145)
Total Bilirubin: 0.9 mg/dL (ref 0.3–1.2)
Total Protein: 6.7 g/dL (ref 6.5–8.1)

## 2023-01-17 LAB — CBC WITH DIFFERENTIAL/PLATELET
Abs Immature Granulocytes: 0.15 10*3/uL — ABNORMAL HIGH (ref 0.00–0.07)
Basophils Absolute: 0 10*3/uL (ref 0.0–0.1)
Basophils Relative: 0 %
Eosinophils Absolute: 0 10*3/uL (ref 0.0–0.5)
Eosinophils Relative: 0 %
HCT: 34.3 % — ABNORMAL LOW (ref 36.0–46.0)
Hemoglobin: 11.7 g/dL — ABNORMAL LOW (ref 12.0–15.0)
Immature Granulocytes: 2 %
Lymphocytes Relative: 3 %
Lymphs Abs: 0.3 10*3/uL — ABNORMAL LOW (ref 0.7–4.0)
MCH: 32.2 pg (ref 26.0–34.0)
MCHC: 34.1 g/dL (ref 30.0–36.0)
MCV: 94.5 fL (ref 80.0–100.0)
Monocytes Absolute: 0.5 10*3/uL (ref 0.1–1.0)
Monocytes Relative: 5 %
Neutro Abs: 8.1 10*3/uL — ABNORMAL HIGH (ref 1.7–7.7)
Neutrophils Relative %: 90 %
Platelets: 137 10*3/uL — ABNORMAL LOW (ref 150–400)
RBC: 3.63 MIL/uL — ABNORMAL LOW (ref 3.87–5.11)
RDW: 14 % (ref 11.5–15.5)
WBC: 9 10*3/uL (ref 4.0–10.5)
nRBC: 0 % (ref 0.0–0.2)

## 2023-01-17 LAB — BETA-HYDROXYBUTYRIC ACID: Beta-Hydroxybutyric Acid: 0.39 mmol/L — ABNORMAL HIGH (ref 0.05–0.27)

## 2023-01-17 LAB — CBG MONITORING, ED: Glucose-Capillary: >600 mg/dL (ref 70–99)

## 2023-01-17 MED ORDER — SODIUM CHLORIDE 0.9 % IV BOLUS (SEPSIS)
1000.0000 mL | Freq: Once | INTRAVENOUS | Status: AC
  Administered 2023-01-17: 1000 mL via INTRAVENOUS

## 2023-01-17 MED ORDER — INSULIN ASPART 100 UNIT/ML IJ SOLN
5.0000 [IU] | Freq: Once | INTRAMUSCULAR | Status: AC
  Administered 2023-01-17: 5 [IU] via INTRAVENOUS
  Filled 2023-01-17: qty 1

## 2023-01-17 MED ORDER — LORAZEPAM 2 MG/ML IJ SOLN
1.0000 mg | Freq: Once | INTRAMUSCULAR | Status: AC
  Administered 2023-01-17: 1 mg via INTRAVENOUS
  Filled 2023-01-17: qty 1

## 2023-01-17 NOTE — ED Notes (Signed)
Pt had a liver transplant 2 years ago.  Pt has been on prednisone for several months.  Today blood sugar elevated.  Denies any pain.  No n/v/d  pt alert     iv in place

## 2023-01-17 NOTE — ED Notes (Signed)
Md at bedside

## 2023-01-17 NOTE — ED Provider Notes (Signed)
[facility] Provider Note    Event Date/Time   First MD Initiated Contact with Patient 01/17/23 2313     (approximate)   History   Hyperglycemia   HPI  Lindsey Huang is a 42 y.o. 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  She reports for the past 3 months she has had polydipsia, polyuria and has felt poorly.  Blood sugars elevated today and she was sent to the ED.  She is not on any medications for diabetes.  She also reports due to being on prednisone she is very anxious and is requesting something for her anxiety.  States that she has a hard time sleeping due to the prednisone and that she cries frequently.  She is also requesting a second opinion to see if I would recommend any other medications other than prednisone for her rejection.  Patient receives all of her care at [facility].    History provided by patient, significant other.    Past Medical History:  Diagnosis Date   Blood transfusion without reported diagnosis 2010   misscarriage   Cirrhosis ([place])    Hx of incompetent cervix, currently pregnant    Medical history non-contributory    Portal hypertensive gastropathy ([place])    Thrombocytopenia ([facility])     Past Surgical History:  Procedure Laterality Date   CERVICAL CERCLAGE     x4   ESOPHAGOGASTRODUODENOSCOPY (EGD) WITH PROPOFOL N/A 10/19/2019   Procedure: ESOPHAGOGASTRODUODENOSCOPY (EGD) WITH PROPOFOL;  Surgeon: Jonathon Bellows, MD;  Location: [facility] ENDOSCOPY;  Service: Gastroenterology;  Laterality: N/A;   ESOPHAGOGASTRODUODENOSCOPY (EGD) WITH PROPOFOL N/A 11/24/2020   Procedure: ESOPHAGOGASTRODUODENOSCOPY (EGD) WITH PROPOFOL;  Surgeon: Lesly Rubenstein, MD;  Location: [facility] ENDOSCOPY;  Service: Endoscopy;  Laterality: N/A;  Would like to do first case of the day  so before the first outpatient.    MEDICATIONS:  Prior to Admission medications   Medication Sig Start Date End Date Taking? Authorizing Provider  albumin human 25 % bottle Inject 100 mLs (25 g total) into the vein every 6 (six) hours. 01/10/21   Danford, Suann Larry, MD  feeding supplement (ENSURE ENLIVE / ENSURE PLUS) LIQD Take 237 mLs by mouth 2 (two) times daily between meals. 01/10/21   Danford, Suann Larry, MD  ferrous sulfate 325 (65 FE) MG tablet Take 1 tablet (325 mg total) by mouth daily with breakfast. 10/09/20   Ezekiel Slocumb, DO  folic acid (FOLVITE) 1 MG tablet Take 1 tablet (1 mg total) by mouth daily. 01/11/21   Danford, Suann Larry, MD  furosemide (LASIX) 20 MG tablet Take 3 tablets (60 mg total) by mouth daily. 10/10/20   Ezekiel Slocumb, DO  lactulose (CHRONULAC) 10 GM/15ML solution Take 15 mLs (10 g total) by mouth 2 (two) times daily. 11/25/20   Fritzi Mandes, MD  midodrine (PROAMATINE) 10 MG tablet Take 1 tablet (10 mg total) by mouth 3 (three) times daily with meals. 01/10/21   Danford, Suann Larry, MD  Multiple Vitamin (MULTIVITAMIN WITH MINERALS) TABS tablet Take 1 tablet by mouth daily.    [provider]  norepinephrine (LEVOPHED) 4-5 MG/250ML-% SOLN Inject 2-10 mcg/min into the vein continuous. 01/10/21   Danford, Suann Larry, MD  ondansetron (ZOFRAN) 4 MG tablet Take 1 tablet (4 mg total) by mouth every 6 (six) hours as needed for nausea. 01/10/21   Myrene Buddy  P, MD  ondansetron (ZOFRAN) 4 MG/2ML SOLN injection Inject 2 mLs (4 mg total) into the vein every 6 (six) hours as needed for nausea. 01/10/21   Danford, Suann Larry, MD  oxyCODONE 10 MG TABS Take 1 tablet (10 mg total) by mouth every 4 (four) hours as needed for moderate pain. 01/10/21   Danford, Suann Larry, MD  pantoprazole (PROTONIX) 40 MG tablet Take 1 tablet (40 mg total) by mouth daily. 11/25/20   Fritzi Mandes, MD  piperacillin-tazobactam (ZOSYN) 3.375 GM/50ML IVPB Inject 50 mLs  (3.375 g total) into the vein every 8 (eight) hours. 01/10/21   Danford, Suann Larry, MD  potassium chloride SA (KLOR-CON) 10 MEQ tablet Take 1 tablet (10 mEq total) by mouth daily. 10/10/20   Ezekiel Slocumb, DO  rifaximin (XIFAXAN) 550 MG TABS tablet Take 1 tablet (550 mg total) by mouth 2 (two) times daily. 01/10/21   Danford, Suann Larry, MD  spironolactone (ALDACTONE) 50 MG tablet Take 1 tablet (50 mg total) by mouth daily. 01/11/21   Danford, Suann Larry, MD  thiamine 100 MG tablet Take 100 mg by mouth daily.    [provider]  traZODone (DESYREL) 100 MG tablet Take 1 tablet (100 mg total) by mouth at bedtime as needed for sleep. 01/10/21   Danford, Suann Larry, MD  vitamin B-12 (CYANOCOBALAMIN) 1000 MCG tablet Take 1,000 mcg by mouth daily.    [provider]    Physical Exam   Triage Vital Signs: ED Triage Vitals  Enc Vitals Group     BP 01/17/23 2140 121/66     Pulse Rate 01/17/23 2140 91     Resp 01/17/23 2140 18     Temp 01/17/23 2140 98.2 F (36.8 C)     Temp Source 01/17/23 2140 Oral     SpO2 01/17/23 2140 100 %     Weight 01/17/23 2136 150 lb (68 kg)     Height 01/17/23 2136 '4\' 11"'$  (1.499 m)     Head Circumference --      Peak Flow --      Pain Score 01/17/23 2136 0     Pain Loc --      Pain Edu? --      Excl. in [place]? --     Most recent vital signs: Vitals:   01/18/23 0430 01/18/23 0530  BP: (!) 118/90 108/72  Pulse:  86  Resp:  14  Temp:    SpO2:  97%    CONSTITUTIONAL: Alert, responds appropriately to questions. Well-appearing; well-nourished HEAD: Normocephalic, atraumatic EYES: Conjunctivae clear, pupils appear equal, sclera nonicteric ENT: normal nose; moist mucous membranes NECK: Supple, normal ROM CARD: RRR; S1 and S2 appreciated RESP: Normal chest excursion without splinting or tachypnea; breath sounds clear and equal bilaterally; no wheezes, no rhonchi, no rales, no hypoxia or respiratory distress, speaking full  sentences ABD/GI: Non-distended; soft, non-tender, no rebound, no guarding, no peritoneal signs BACK: The back appears normal EXT: Normal ROM in all joints; no deformity noted, no edema SKIN: Normal color for age and race; warm; no rash on exposed skin NEURO: Moves all extremities equally, normal speech PSYCH: The patient's mood and manner are appropriate.   ED Results / Procedures / Treatments   LABS: (all labs ordered are listed, but only abnormal results are displayed) Labs Reviewed  CBC WITH DIFFERENTIAL/PLATELET - Abnormal; Notable for the following components:      Result Value   RBC 3.63 (*)    Hemoglobin 11.7 (*)  HCT 34.3 (*)    Platelets 137 (*)    Neutro Abs 8.1 (*)    Lymphs Abs 0.3 (*)    Abs Immature Granulocytes 0.15 (*)    All other components within normal limits  COMPREHENSIVE METABOLIC PANEL - Abnormal; Notable for the following components:   Sodium 127 (*)    Chloride 94 (*)    Glucose, Bld 649 (*)    BUN 48 (*)    Creatinine, Ser 1.37 (*)    Calcium 8.8 (*)    GFR, Estimated 49 (*)    All other components within normal limits  BETA-HYDROXYBUTYRIC ACID - Abnormal; Notable for the following components:   Beta-Hydroxybutyric Acid 0.39 (*)    All other components within normal limits  URINALYSIS, ROUTINE W REFLEX MICROSCOPIC - Abnormal; Notable for the following components:   Color, Urine STRAW (*)    APPearance CLEAR (*)    Glucose, UA >=500 (*)    Hgb urine dipstick SMALL (*)    All other components within normal limits  CBG MONITORING, ED - Abnormal; Notable for the following components:   Glucose-Capillary >600 (*)    All other components within normal limits  CBG MONITORING, ED - Abnormal; Notable for the following components:   Glucose-Capillary 339 (*)    All other components within normal limits  CBG MONITORING, ED - Abnormal; Notable for the following components:   Glucose-Capillary 336 (*)    All other components within normal limits   CBG MONITORING, ED - Abnormal; Notable for the following components:   Glucose-Capillary 337 (*)    All other components within normal limits  CBG MONITORING, ED - Abnormal; Notable for the following components:   Glucose-Capillary 253 (*)    All other components within normal limits     EKG:   RADIOLOGY: My personal review and interpretation of imaging:    I have personally reviewed all radiology reports.   No results found.   PROCEDURES:  Critical Care performed: No      Procedures    IMPRESSION / MDM / ASSESSMENT AND PLAN / ED COURSE  I reviewed the triage vital signs and the nursing notes.    Patient here for hyperglycemia, anxiety secondary to prednisone that she has been on for acute liver transplant rejection.    DIFFERENTIAL DIAGNOSIS (includes but not limited to):   Hyperglycemia secondary to steroids, DKA, HHS, dehydration, anxiety   Patient's presentation is most consistent with acute presentation with potential threat to life or bodily function.   PLAN: Labs obtained from triage show glucose of 649.  Minimally elevated creatinine which has improved compared to last creatinine on 10/23/2022.  Normal bicarb, anion gap.  Normal LFTs.  Urine shows no ketones.  She is not in DKA today.  Will give IV fluids, IV insulin for her hyperglycemia.  Will give IV Ativan for her anxiety.  Had lengthy discussion with patient that medications for her hyperglycemia and anxiety would need to be managed by her outpatient liver transplant team given she has a complex history and not managed by the ED physician that is only seen her 1 time today.  She seems disappointed by this but verbalized understanding.  I have also discussed with her that I would not be the appropriate person to give her a second opinion regarding antirejection medications.   MEDICATIONS GIVEN IN ED: Medications  sodium chloride 0.9 % bolus 1,000 mL (0 mLs Intravenous Stopped 01/18/23 0113)  sodium  chloride 0.9 % bolus 1,000  mL (0 mLs Intravenous Stopped 01/18/23 0114)  LORazepam (ATIVAN) injection 1 mg (1 mg Intravenous Given 01/17/23 2349)  insulin aspart (novoLOG) injection 5 Units (5 Units Intravenous Given 01/17/23 2349)  sodium chloride 0.9 % bolus 1,000 mL (0 mLs Intravenous Stopped 01/18/23 0223)  insulin aspart (novoLOG) injection 5 Units (5 Units Intravenous Given 01/18/23 0123)  insulin aspart (novoLOG) injection 10 Units (10 Units Intravenous Given 01/18/23 0452)     ED COURSE: Patient has been sleeping throughout her ED visit.  Her blood glucose has improved in the 200s after fluids and IV insulin.  I have encouraged her to follow-up with endocrinology who is already aware of her and it appears that they can see her next week.  Recommended diet changes.   At this time, I do not feel there is any life-threatening condition present. I reviewed all nursing notes, vitals, pertinent previous records.  All lab and urine results, EKGs, imaging ordered have been independently reviewed and interpreted by myself.  I reviewed all available radiology reports from any imaging ordered this visit.  Based on my assessment, I feel the patient is safe to be discharged home without further emergent workup and can continue workup as an outpatient as needed. Discussed all findings, treatment plan as well as usual and customary return precautions.  They verbalize understanding and are comfortable with this plan.  Outpatient follow-up has been provided as needed.  All questions have been answered.    CONSULTS:  none   OUTSIDE RECORDS REVIEWED: Reviewed her admission at [facility] on 10/23/2022.  There is a note from Mackie Pai at [facility] endocrinology stating that she could see patient in the office next week.      FINAL CLINICAL IMPRESSION(S) / ED DIAGNOSES   Final diagnoses:  Steroid-induced hyperglycemia  Anxiety     Rx / DC Orders   ED Discharge Orders     None        Note:  This  document was prepared using Dragon voice recognition software and may include unintentional dictation errors.   Tanny Harnack, Delice Bison, DO 01/18/23 [phone]

## 2023-01-17 NOTE — ED Triage Notes (Signed)
Pt ambulatory to triage with c/o hyperglycemia.  Taking prednisone since December for liver rejection. Had transplant 02/2021.   CBG reading high at home. Pt having urinary frequency, constant thirst and tremors that have been ongoing for months.

## 2023-01-17 NOTE — Discharge Instructions (Addendum)
Please follow-up with endocrinology and your primary team for further management of your hyperglycemia and anxiety.  Your lab work did not show a diabetic emergency today.

## 2023-01-18 LAB — CBG MONITORING, ED
Glucose-Capillary: 339 mg/dL — ABNORMAL HIGH (ref 70–99)
Glucose-Capillary: 336 mg/dL — ABNORMAL HIGH (ref 70–99)
Glucose-Capillary: 253 mg/dL — ABNORMAL HIGH (ref 70–99)
Glucose-Capillary: 337 mg/dL — ABNORMAL HIGH (ref 70–99)

## 2023-01-18 MED ORDER — SODIUM CHLORIDE 0.9 % IV BOLUS (SEPSIS)
1000.0000 mL | Freq: Once | INTRAVENOUS | Status: AC
  Administered 2023-01-18: 1000 mL via INTRAVENOUS

## 2023-01-18 MED ORDER — INSULIN ASPART 100 UNIT/ML IJ SOLN
5.0000 [IU] | Freq: Once | INTRAMUSCULAR | Status: AC
  Administered 2023-01-18: 5 [IU] via INTRAVENOUS
  Filled 2023-01-18: qty 1

## 2023-01-18 MED ORDER — INSULIN ASPART 100 UNIT/ML IJ SOLN
10.0000 [IU] | Freq: Once | INTRAMUSCULAR | Status: AC
  Administered 2023-01-18: 10 [IU] via INTRAVENOUS
  Filled 2023-01-18: qty 1

## 2023-01-18 NOTE — ED Notes (Signed)
Fsbs 339  md aware.

## 2023-01-18 NOTE — ED Notes (Signed)
Fsbs 336

## 2023-01-18 NOTE — ED Notes (Signed)
Discharge instructions explained to patient and family at this time. Patient and family state they understand and agree.
